# Patient Record
Sex: Female | Born: 1935 | Race: White | Hispanic: No | Smoking: Never smoker
Health system: Southern US, Community
[De-identification: ages and names within clinical notes are randomized; demographics above are authoritative.]

## PROBLEM LIST (undated history)

## (undated) DIAGNOSIS — F329 Major depressive disorder, single episode, unspecified: Secondary | ICD-10-CM

## (undated) DIAGNOSIS — I10 Essential (primary) hypertension: Secondary | ICD-10-CM

## (undated) DIAGNOSIS — I779 Disorder of arteries and arterioles, unspecified: Secondary | ICD-10-CM

## (undated) DIAGNOSIS — I4719 Other supraventricular tachycardia: Secondary | ICD-10-CM

## (undated) DIAGNOSIS — E7800 Pure hypercholesterolemia, unspecified: Secondary | ICD-10-CM

## (undated) DIAGNOSIS — E039 Hypothyroidism, unspecified: Secondary | ICD-10-CM

## (undated) DIAGNOSIS — I639 Cerebral infarction, unspecified: Secondary | ICD-10-CM

## (undated) DIAGNOSIS — I739 Peripheral vascular disease, unspecified: Secondary | ICD-10-CM

## (undated) DIAGNOSIS — I471 Supraventricular tachycardia: Secondary | ICD-10-CM

## (undated) DIAGNOSIS — F32A Depression, unspecified: Secondary | ICD-10-CM

## (undated) HISTORY — DX: Cerebral infarction, unspecified: I63.9

## (undated) HISTORY — PX: EYE SURGERY: SHX253

## (undated) HISTORY — PX: BREAST SURGERY: SHX581

---

## 1999-02-28 ENCOUNTER — Other Ambulatory Visit: Admission: RE | Admit: 1999-02-28 | Discharge: 1999-02-28 | Payer: Self-pay | Admitting: Internal Medicine

## 2000-01-25 ENCOUNTER — Encounter: Admission: RE | Admit: 2000-01-25 | Discharge: 2000-01-25 | Payer: Self-pay | Admitting: Internal Medicine

## 2000-01-25 ENCOUNTER — Encounter: Payer: Self-pay | Admitting: Internal Medicine

## 2001-05-12 ENCOUNTER — Emergency Department (HOSPITAL_COMMUNITY): Admission: EM | Admit: 2001-05-12 | Discharge: 2001-05-13 | Payer: Self-pay | Admitting: Emergency Medicine

## 2001-05-14 ENCOUNTER — Encounter: Admission: RE | Admit: 2001-05-14 | Discharge: 2001-05-14 | Payer: Self-pay | Admitting: Internal Medicine

## 2001-05-14 ENCOUNTER — Encounter: Payer: Self-pay | Admitting: Internal Medicine

## 2001-05-20 ENCOUNTER — Encounter: Admission: RE | Admit: 2001-05-20 | Discharge: 2001-05-20 | Payer: Self-pay | Admitting: Internal Medicine

## 2001-05-20 ENCOUNTER — Encounter: Payer: Self-pay | Admitting: Internal Medicine

## 2001-05-22 ENCOUNTER — Encounter: Admission: RE | Admit: 2001-05-22 | Discharge: 2001-05-22 | Payer: Self-pay | Admitting: Internal Medicine

## 2001-05-22 ENCOUNTER — Encounter: Payer: Self-pay | Admitting: Internal Medicine

## 2001-06-15 ENCOUNTER — Encounter: Payer: Self-pay | Admitting: Emergency Medicine

## 2001-06-15 ENCOUNTER — Encounter: Payer: Self-pay | Admitting: Orthopedic Surgery

## 2001-06-15 ENCOUNTER — Inpatient Hospital Stay (HOSPITAL_COMMUNITY): Admission: EM | Admit: 2001-06-15 | Discharge: 2001-06-16 | Payer: Self-pay | Admitting: Emergency Medicine

## 2001-07-29 ENCOUNTER — Encounter: Admission: RE | Admit: 2001-07-29 | Discharge: 2001-08-01 | Payer: Self-pay | Admitting: Family Medicine

## 2001-08-12 ENCOUNTER — Encounter: Admission: RE | Admit: 2001-08-12 | Discharge: 2001-09-30 | Payer: Self-pay | Admitting: Family Medicine

## 2002-01-20 ENCOUNTER — Encounter: Admission: RE | Admit: 2002-01-20 | Discharge: 2002-02-05 | Payer: Self-pay | Admitting: Orthopedic Surgery

## 2002-08-25 ENCOUNTER — Ambulatory Visit (HOSPITAL_BASED_OUTPATIENT_CLINIC_OR_DEPARTMENT_OTHER): Admission: RE | Admit: 2002-08-25 | Discharge: 2002-08-25 | Payer: Self-pay | Admitting: *Deleted

## 2002-08-25 ENCOUNTER — Encounter: Payer: Self-pay | Admitting: Otolaryngology

## 2002-08-25 ENCOUNTER — Encounter (INDEPENDENT_AMBULATORY_CARE_PROVIDER_SITE_OTHER): Payer: Self-pay | Admitting: *Deleted

## 2004-09-26 ENCOUNTER — Ambulatory Visit: Payer: Self-pay | Admitting: Family Medicine

## 2004-09-29 ENCOUNTER — Ambulatory Visit: Payer: Self-pay | Admitting: Family Medicine

## 2004-10-02 ENCOUNTER — Ambulatory Visit: Payer: Self-pay | Admitting: Family Medicine

## 2004-10-05 ENCOUNTER — Ambulatory Visit: Payer: Self-pay | Admitting: Gastroenterology

## 2004-11-23 ENCOUNTER — Ambulatory Visit: Payer: Self-pay | Admitting: Gastroenterology

## 2004-11-27 ENCOUNTER — Ambulatory Visit: Payer: Self-pay | Admitting: Gastroenterology

## 2005-10-19 ENCOUNTER — Ambulatory Visit: Payer: Self-pay | Admitting: Family Medicine

## 2006-12-30 ENCOUNTER — Ambulatory Visit: Payer: Self-pay | Admitting: Family Medicine

## 2006-12-30 LAB — CONVERTED CEMR LAB
ALT: 15 units/L (ref 0–40)
AST: 23 units/L (ref 0–37)
Basophils Relative: 0.2 % (ref 0.0–1.0)
Bilirubin, Direct: 0.1 mg/dL (ref 0.0–0.3)
CO2: 35 meq/L — ABNORMAL HIGH (ref 19–32)
Calcium: 9.3 mg/dL (ref 8.4–10.5)
Direct LDL: 135.3 mg/dL
Eosinophils Absolute: 0.2 10*3/uL (ref 0.0–0.6)
Eosinophils Relative: 2.9 % (ref 0.0–5.0)
GFR calc Af Amer: 103 mL/min
Glucose, Bld: 110 mg/dL — ABNORMAL HIGH (ref 70–99)
HCT: 44.8 % (ref 36.0–46.0)
HDL: 42.4 mg/dL (ref >39.0)
Hemoglobin: 14.9 g/dL (ref 12.0–15.0)
Lymphocytes Relative: 34.1 % (ref 12.0–46.0)
MCV: 90.4 fL (ref 78.0–100.0)
Neutro Abs: 4.2 10*3/uL (ref 1.4–7.7)
Neutrophils Relative %: 54.2 % (ref 43.0–77.0)
Platelets: 178 10*3/uL (ref 150–400)
Sodium: 143 meq/L (ref 135–145)
Total Protein: 6.9 g/dL (ref 6.0–8.3)
VLDL: 44 mg/dL — ABNORMAL HIGH (ref 0–40)
WBC: 7.7 10*3/uL (ref 4.5–10.5)

## 2007-01-02 ENCOUNTER — Ambulatory Visit: Payer: Self-pay | Admitting: Family Medicine

## 2007-01-08 ENCOUNTER — Telehealth: Payer: Self-pay | Admitting: Family Medicine

## 2007-02-14 ENCOUNTER — Ambulatory Visit: Payer: Self-pay | Admitting: Family Medicine

## 2007-02-14 DIAGNOSIS — R319 Hematuria, unspecified: Secondary | ICD-10-CM | POA: Insufficient documentation

## 2007-12-10 ENCOUNTER — Encounter: Payer: Self-pay | Admitting: Family Medicine

## 2007-12-11 ENCOUNTER — Ambulatory Visit: Payer: Self-pay | Admitting: Family Medicine

## 2007-12-11 DIAGNOSIS — I10 Essential (primary) hypertension: Secondary | ICD-10-CM

## 2007-12-11 DIAGNOSIS — F339 Major depressive disorder, recurrent, unspecified: Secondary | ICD-10-CM

## 2007-12-11 DIAGNOSIS — E039 Hypothyroidism, unspecified: Secondary | ICD-10-CM | POA: Insufficient documentation

## 2007-12-11 LAB — CONVERTED CEMR LAB
Albumin: 3.8 g/dL (ref 3.5–5.2)
Alkaline Phosphatase: 57 units/L (ref 39–117)
BUN: 12 mg/dL (ref 6–23)
Basophils Relative: 0.6 % (ref 0.0–1.0)
Calcium: 9.3 mg/dL (ref 8.4–10.5)
Creatinine, Ser: 0.9 mg/dL (ref 0.4–1.2)
Eosinophils Relative: 3 % (ref 0.0–5.0)
GFR calc Af Amer: 77 mL/min
Glucose, Bld: 123 mg/dL — ABNORMAL HIGH (ref 70–99)
Hemoglobin: 14.8 g/dL (ref 12.0–15.0)
LDL Cholesterol: 107 mg/dL — ABNORMAL HIGH (ref 0–99)
Lymphocytes Relative: 34.9 % (ref 12.0–46.0)
Monocytes Relative: 8.2 % (ref 3.0–12.0)
Neutrophils Relative %: 53.3 % (ref 43.0–77.0)
Potassium: 3.5 meq/L (ref 3.5–5.1)
Protein, U semiquant: NEGATIVE
RBC: 4.7 M/uL (ref 3.87–5.11)
TSH: 2.52 microintl units/mL (ref 0.35–5.50)
Total CHOL/HDL Ratio: 4.5
Total Protein: 6.6 g/dL (ref 6.0–8.3)
Urobilinogen, UA: 0.2
VLDL: 30 mg/dL (ref 0–40)
WBC Urine, dipstick: NEGATIVE
WBC: 7.9 10*3/uL (ref 4.5–10.5)

## 2008-06-30 ENCOUNTER — Encounter: Payer: Self-pay | Admitting: Family Medicine

## 2008-08-09 ENCOUNTER — Ambulatory Visit: Payer: Self-pay | Admitting: Family Medicine

## 2008-08-09 ENCOUNTER — Inpatient Hospital Stay (HOSPITAL_COMMUNITY): Admission: AD | Admit: 2008-08-09 | Discharge: 2008-08-10 | Payer: Self-pay | Admitting: Family Medicine

## 2008-08-11 ENCOUNTER — Ambulatory Visit: Payer: Self-pay | Admitting: Cardiology

## 2008-08-19 ENCOUNTER — Ambulatory Visit: Payer: Self-pay | Admitting: Family Medicine

## 2008-08-26 ENCOUNTER — Ambulatory Visit: Payer: Self-pay | Admitting: Family Medicine

## 2008-08-26 DIAGNOSIS — R05 Cough: Secondary | ICD-10-CM

## 2008-08-26 DIAGNOSIS — R059 Cough, unspecified: Secondary | ICD-10-CM | POA: Insufficient documentation

## 2008-09-02 ENCOUNTER — Ambulatory Visit: Payer: Self-pay | Admitting: Family Medicine

## 2008-09-30 ENCOUNTER — Ambulatory Visit: Payer: Self-pay | Admitting: Family Medicine

## 2009-08-15 ENCOUNTER — Ambulatory Visit: Payer: Self-pay | Admitting: Family Medicine

## 2009-08-31 ENCOUNTER — Ambulatory Visit: Payer: Self-pay | Admitting: Family Medicine

## 2009-09-23 ENCOUNTER — Ambulatory Visit: Payer: Self-pay | Admitting: Family Medicine

## 2009-09-27 LAB — CONVERTED CEMR LAB
ALT: 20 U/L
AST: 26 U/L
Albumin: 4.1 g/dL
Alkaline Phosphatase: 46 U/L
BUN: 11 mg/dL
Basophils Absolute: 0 K/uL
Basophils Relative: 0.2 %
Bilirubin, Direct: 0.1 mg/dL
CO2: 33 meq/L — ABNORMAL HIGH
Calcium: 10 mg/dL
Chloride: 102 meq/L
Creatinine, Ser: 0.8 mg/dL
Eosinophils Absolute: 0.2 K/uL
Eosinophils Relative: 2.6 %
GFR calc non Af Amer: 72.3 mL/min
Glucose, Bld: 105 mg/dL — ABNORMAL HIGH
HCT: 43.8 %
Hemoglobin: 14.3 g/dL
Lymphocytes Relative: 28.8 %
Lymphs Abs: 1.8 K/uL
MCHC: 32.6 g/dL
MCV: 96.1 fL
Monocytes Absolute: 0.5 K/uL
Monocytes Relative: 7.8 %
Neutro Abs: 3.7 K/uL
Neutrophils Relative %: 60.6 %
Platelets: 150 K/uL
Potassium: 3.9 meq/L
RBC: 4.55 M/uL
RDW: 13.2 %
Sodium: 145 meq/L
TSH: 1.54 u[IU]/mL
Total Bilirubin: 1 mg/dL
Total Protein: 7.3 g/dL
WBC: 6.2 10*3/microliter

## 2009-10-14 ENCOUNTER — Ambulatory Visit: Payer: Self-pay | Admitting: Family Medicine

## 2009-12-14 ENCOUNTER — Telehealth: Payer: Self-pay | Admitting: Family Medicine

## 2009-12-15 ENCOUNTER — Ambulatory Visit: Payer: Self-pay | Admitting: Family Medicine

## 2009-12-15 DIAGNOSIS — R609 Edema, unspecified: Secondary | ICD-10-CM | POA: Insufficient documentation

## 2010-02-01 ENCOUNTER — Telehealth: Payer: Self-pay | Admitting: Family Medicine

## 2010-04-11 ENCOUNTER — Ambulatory Visit: Payer: Self-pay | Admitting: Family Medicine

## 2010-04-11 LAB — CONVERTED CEMR LAB
Albumin: 4.3 g/dL (ref 3.5–5.2)
Alkaline Phosphatase: 53 units/L (ref 39–117)
Basophils Absolute: 0 10*3/uL (ref 0.0–0.1)
Bilirubin, Direct: 0.1 mg/dL (ref 0.0–0.3)
CO2: 35 meq/L — ABNORMAL HIGH (ref 19–32)
Calcium: 10.4 mg/dL (ref 8.4–10.5)
Creatinine, Ser: 0.8 mg/dL (ref 0.4–1.2)
Eosinophils Absolute: 0.1 10*3/uL (ref 0.0–0.7)
Free T4: 1.1 ng/dL (ref 0.60–1.60)
Glucose, Bld: 117 mg/dL — ABNORMAL HIGH (ref 70–99)
Lymphocytes Relative: 24.4 % (ref 12.0–46.0)
MCHC: 34.5 g/dL (ref 30.0–36.0)
Neutro Abs: 5.2 10*3/uL (ref 1.4–7.7)
Neutrophils Relative %: 66.1 % (ref 43.0–77.0)
RDW: 13.5 % (ref 11.5–14.6)
T3, Free: 2.4 pg/mL (ref 2.3–4.2)
Transferrin: 282 mg/dL (ref 212.0–360.0)
Vitamin B-12: 1273 pg/mL — ABNORMAL HIGH (ref 211–911)

## 2010-04-13 ENCOUNTER — Encounter: Payer: Self-pay | Admitting: Family Medicine

## 2010-04-13 ENCOUNTER — Ambulatory Visit: Payer: Self-pay | Admitting: Family Medicine

## 2010-04-13 LAB — CONVERTED CEMR LAB
Ketones, urine, test strip: NEGATIVE
Nitrite: NEGATIVE
Protein, U semiquant: NEGATIVE
Urobilinogen, UA: 0.2

## 2010-04-27 ENCOUNTER — Encounter: Payer: Self-pay | Admitting: Family Medicine

## 2010-08-08 NOTE — Assessment & Plan Note (Signed)
Summary: follow up - rv   Vital Signs:  Patient profile:   77 year old female Height:      61 inches Weight:      152 pounds BMI:     28.82 Temp:     98.0 degrees F oral BP sitting:   140 / 80  (left arm) Cuff size:   regular  Vitals Entered By: Kern Reap CMA Duncan Dull) (April 11, 2010 11:57 AM) CC: follow-up visit   CC:  follow-up visit.  History of Present Illness: momma is an 77 year old, widowed female, nonsmoker, who comes in today for evaluation of episodic symptoms of feeling flushed.  A cane and jittery for the past 6 months.  Review of systems negative.  No new medications.  In reviewing her new medication.  She takes her Lexapro in the morning.  It may be a side effect from that.  Will switch and taken at bedtime.  She's also having difficulty swallowing.  The potassium and we will switch her to another product.  BP good at 140/80.  Blood pressure at home in the morning, usually 120 to 130 systolic over 80 diastolic  Allergies: 1)  ! Sulfa  Review of Systems      See HPI  Physical Exam  General:  Well-developed,well-nourished,in no acute distress; alert,appropriate and cooperative throughout examination Extremities:  1+ left pedal edema and 1+ right pedal edema.     Impression & Recommendations:  Problem # 1:  EDEMA- LOCALIZED (ICD-782.3) Assessment Unchanged  Her updated medication list for this problem includes:    Indapamide 2.5 Mg Tabs (Indapamide) .Marland Kitchen... 1 once daily    Hydrochlorothiazide 25 Mg Tabs (Hydrochlorothiazide) .Marland Kitchen... Take 1 tablet by mouth two times a day  Orders: Prescription Created Electronically [phone])  Problem # 2:  HYPOTHYROIDISM (ICD-244.9) Assessment: Unchanged  Her updated medication list for this problem includes:    Synthroid 125 Mcg Tabs (Levothyroxine sodium) .Marland Kitchen... 1 once daily  Orders: Venipuncture (64403) TLB-BMP (Basic Metabolic Panel-BMET) (80048-METABOL) TLB-CBC Platelet - w/Differential  (85025-CBCD) TLB-Hepatic/Liver Function Pnl (80076-HEPATIC) TLB-TSH (Thyroid Stimulating Hormone) (84443-TSH) TLB-T4 (Thyrox), Free [phone]) TLB-T3, Free (Triiodothyronine) (84481-T3FREE) TLB-B12 + Folate Pnl ([id number]/CZY) TLB-IBC Pnl (Iron/FE;Transferrin) (83550-IBC) Prescription Created Electronically [id number]) Specimen Handling (16010)  Complete Medication List: 1)  Atenolol 50 Mg Tabs (Atenolol) .Marland Kitchen.. 1 tablet by mouth twice a day 2)  Cyanocobalamin 1000 Mcg/ml Soln (Cyanocobalamin) .... Administer 1 ml intramuscularly as directed 3)  Indapamide 2.5 Mg Tabs (Indapamide) .Marland Kitchen.. 1 once daily 4)  Lexapro 10 Mg Tabs (Escitalopram oxalate) .... 1/2 at bedtime 5)  Lipitor 20 Mg Tabs (Atorvastatin calcium) .Marland Kitchen.. 1 at bedtime 6)  Synthroid 125 Mcg Tabs (Levothyroxine sodium) .Marland Kitchen.. 1 once daily 7)  Bd Disp Needles 27g X 1/2" Misc (Needle (disp)) .... Use for b12 injections 8)  Norvasc 5 Mg Tabs (Amlodipine besylate) .... Take 1 tablet by mouth two times a day 9)  Hydrochlorothiazide 25 Mg Tabs (Hydrochlorothiazide) .... Take 1 tablet by mouth two times a day 10)  Effer-k 20 Meq Tbef (Potassium bicarb-citric acid) .... Take 1 tablet by mouth every morning  Other Orders: Influenza Vaccine MCR (93235)  Patient Instructions: 1)  switch the Lexapro to a half a tablet a day at bedtime. 2)  Stop the potassium ........Marland Kitchen   effer-k  one daily. Prescriptions: EFFER-K 20 MEQ TBEF (POTASSIUM BICARB-CITRIC ACID) Take 1 tablet by mouth every morning  #100 x 3   Entered and Authorized by:   Roderick Pee MD   Signed  by:   Roderick Pee MD on 04/11/2010   Method used:   [facility]        [facility] [address] [phone]* (retail)       [address]       [place], [place]  [address]       Ph: [phone] or [phone]       Fax: [phone]   RxID:   [id number]    Immunizations Administered:  Influenza Vaccine # 1:    Vaccine Type: Fluvax MCR    Site:  left deltoid    Mfr: GlaxoSmithKline    Dose: 0.5 ml    Route: IM    Given by: Kern Reap CMA (AAMA)    Exp. Date: 01/06/2011    Lot #: [id number]    Physician counseled: yes

## 2010-08-08 NOTE — Progress Notes (Signed)
Summary: Pt said HCTZ med was changed from qd to bid. Please re-write  Phone Note Call from Patient Call back at Home Phone [phone]   Caller: Patient Summary of Call: Pt called and said that Dr Tawanna Cooler change her HCTZ from 1 once daily to two times a day.   Pt has refill 3 refills remaining, but [facility] [address] will not refill until the instructions have been changed to two times a day.  Initial call taken by: Lucy Antigua,  February 01, 2010 8:10 AM    New/Updated Medications: HYDROCHLOROTHIAZIDE 25 MG TABS (HYDROCHLOROTHIAZIDE) Take 1 tablet by mouth two times a day Prescriptions: HYDROCHLOROTHIAZIDE 25 MG TABS (HYDROCHLOROTHIAZIDE) Take 1 tablet by mouth two times a day  #60 x 3   Entered by:   Kern Reap CMA (AAMA)   Authorized by:   Roderick Pee MD   Signed by:   Kern Reap CMA (AAMA) on 02/01/2010   Method used:   [facility]        [facility] [address] [phone]* (retail)       [address]       [place], [place]  [address]       Ph: [phone] or [phone]       Fax: [phone]   RxID:   [id number]

## 2010-08-08 NOTE — Assessment & Plan Note (Signed)
Summary: 1 month rov/njr   Vital Signs:  Patient profile:   77 year old female Weight:      154 pounds Temp:     97.9 degrees F oral BP sitting:   170 / 78  (left arm) Cuff size:   regular  Vitals Entered By: Kern Reap CMA Duncan Dull) (September 23, 2009 10:21 AM)  Reason for Visit follow up blood pressure  History of Present Illness: Maria Clarke is an 77 year old widowed female, who comes in today for follow-up of hypertension.  We been adjusting her medication because her blood pressure has been elevated.  She, states she's consistently taking her blood pressure as outlined.  BP today by me right arm sitting position 180/80.  Allergies: 1)  ! Sulfa  Past History:  Past medical, surgical, family and social histories (including risk factors) reviewed for relevance to current acute and chronic problems.  Past Medical History: Reviewed history from 12/11/2007 and no changes required. Anemia- Depression Hyperlipidemia Hypertension Hypothyroidism  Family History: Reviewed history from 12/11/2007 and no changes required. she has one brother, who is in good health 3 sisters, one died of a brain tumor one Eloise died of congestive heart failure.  The other died last year of old age.  Social History: Reviewed history from 12/11/2007 and no changes required. 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 1 tablet by mouth twice a day    Indapamide 2.5 Mg Tabs (Indapamide) .Marland Kitchen... 1 once daily    Norvasc 5  Mg Tabs (Amlodipine besylate) .Marland Kitchen... Take 1 tablet by mouth two times a day  Orders: Venipuncture ([address]) TLB-BMP (Basic Metabolic Panel-BMET) (80048-METABOL) TLB-CBC Platelet - w/Differential (85025-CBCD) TLB-Hepatic/Liver Function Pnl (80076-HEPATIC) TLB-TSH (Thyroid Stimulating Hormone) (60454-UJW) Prescription Created Electronically [phone])  Complete Medication List: 1)  Atenolol 50 Mg Tabs (Atenolol) .Marland Kitchen.. 1 tablet by mouth twice a day 2)  Cyanocobalamin 1000 Mcg/ml Soln (Cyanocobalamin) .... Administer 1 ml intramuscularly as directed 3)  Indapamide 2.5 Mg Tabs (Indapamide) .Marland Kitchen.. 1 once daily 4)  Klor-con M20 20 Meq Tbcr (Potassium chloride crys cr) .Marland Kitchen.. 1 qam 5)  Lexapro 10 Mg Tabs (Escitalopram oxalate) .... 1/2 tablet by mouth every morning 6)  Lipitor 20 Mg Tabs (Atorvastatin calcium) .Marland Kitchen.. 1 at bedtime 7)  Synthroid 125 Mcg Tabs (Levothyroxine sodium) .Marland Kitchen.. 1 once daily 8)  Bd Disp Needles 27g X 1/2" Misc (Needle (disp)) .... Use for b12 injections 9)  Norvasc 5 Mg Tabs (Amlodipine besylate) .... Take 1 tablet by mouth two times a day  Patient Instructions: 1)  increase the Norvasc to take one tablet twice a day.  Continue the atenolol twice a day, and the indapamide, once daily, and potassium supplement. 2)  Check your blood pressure morning, noon, and bedtime, and return in 3 weeks for follow-up when u  returns bring  a record of all your blood pressure readings and the device Prescriptions: NORVASC 5 MG TABS (AMLODIPINE BESYLATE) Take 1 tablet by mouth two times a day  #200 x 3   Entered and Authorized by:   Roderick Pee MD   Signed  by:   Roderick Pee MD on 09/23/2009   Method used:   [facility] to        [facility] [address] [phone]* (retail)       [address]       [place], [place]  [address]       Ph: [phone] or [phone]       Fax: [phone]   RxID:   [id number]

## 2010-08-08 NOTE — Assessment & Plan Note (Signed)
Summary: recheck/dm   Vital Signs:  Patient profile:   77 year old female Weight:      154 pounds Temp:     98.5 degrees F oral BP sitting:   144 / 72  (left arm) Cuff size:   regular  Vitals Entered By: Kern Reap CMA Duncan Dull) (August 31, 2009 9:32 AM)  Reason for Visit follow up HTN  History of Present Illness: Mallissa is an 77 year old female, who comes in today for reevaluation of hypertension.  Couple weeks ago.  Her blood pressure spikes because she skipped a dose.  Now BP is averaging 140 to 150 systolic.  Diastolic 60 to 80.  She also has some symptoms of allergic rhinitis with head congestion, postnasal drip, and some cord, and throat, sinus pressure, pain.  She thinks is related to her blood pressure.  However, in the afternoon, her blood pressure is fairly normal.  Allergies: 1)  ! Sulfa PMH-FH-SH reviewed-no changes except otherwise noted  Review of Systems      See HPI  Physical Exam  General:  Well-developed,well-nourished,in no acute distress; alert,appropriate and cooperative throughout examination Heart:  150/60 with her cuff and  ours.   Impression & Recommendations:  Problem # 1:  HYPERTENSION (ICD-401.9) Assessment Improved  Her updated medication list for this problem includes:    Atenolol 50 Mg Tabs (Atenolol) .Marland Kitchen... 1 tablet by mouth twice a day    Indapamide 2.5 Mg Tabs (Indapamide) .Marland Kitchen... 1 once daily    Norvasc 5 Mg Tabs (Amlodipine besylate) .Marland Kitchen... Take 1 tablet by mouth every morning  Complete Medication List: 1)  Atenolol 50 Mg Tabs (Atenolol) .Marland Kitchen.. 1 tablet by mouth twice a day 2)  Cyanocobalamin 1000 Mcg/ml Soln (Cyanocobalamin) .... Administer 1 ml intramuscularly as directed 3)  Indapamide 2.5 Mg Tabs (Indapamide) .Marland Kitchen.. 1 once daily 4)  Klor-con M20 20 Meq Tbcr (Potassium chloride crys cr) .Marland Kitchen.. 1 qam 5)  Lexapro 10 Mg Tabs (Escitalopram oxalate) .... 1/2 tablet by mouth every morning 6)  Lipitor 20 Mg Tabs (Atorvastatin calcium) .Marland Kitchen.. 1  at bedtime 7)  Synthroid 125 Mcg Tabs (Levothyroxine sodium) .Marland Kitchen.. 1 once daily 8)  Bd Disp Needles 27g X 1/2" Misc (Needle (disp)) .... Use for b12 injections 9)  Norvasc 5 Mg Tabs (Amlodipine besylate) .... Take 1 tablet by mouth every morning  Patient Instructions: 1)    Check your blood pressure once a day in the morning.  Continue current medications.  Follow-up in 4 weeks. 2)  Take plain Claritin, 10 mg in the morning for your allergic rhinitis

## 2010-08-08 NOTE — Assessment & Plan Note (Signed)
Summary: feet swelling/njr   Vital Signs:  Patient profile:   77 year old female Weight:      155 pounds Temp:     98.2 degrees F oral BP sitting:   142 / 70  (left arm) Cuff size:   regular  Vitals Entered By: Kern Reap CMA Duncan Dull) (December 15, 2009 11:43 AM) CC: feet swelling    CC:  feet swelling .  History of Present Illness: Maria Clarke is a 77 year old, widowed female, nonsmoker, who comes in today for evaluation of peripheral edema.  She's been on Norvasc for many, months.  She's had good blood pressure control, systolic 120 to 130over 70 to 80 diastolic at home.  Over the past, week she's noticed peripheral edema.  She's had no chest pain, shortness of breath, etc..  She does consume a lot of salt.  Allergies: 1)  ! Sulfa  Past History:  Past medical, surgical, family and social histories (including risk factors) reviewed for relevance to current acute and chronic problems.  Past Medical History: Reviewed history from 12/11/2007 and no changes required. Anemia- Depression Hyperlipidemia Hypertension Hypothyroidism  Family History: Reviewed history from 12/11/2007 and no changes required. she has one brother, who is in good health 3 sisters, one died of a brain tumor one Eloise died of congestive heart failure.  The other died last year of old age.  Social History: Reviewed history from 12/11/2007 and no changes required. 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     Problems:  Medical Problems Added: 1)  Dx of Edema- Localized  (ICD-782.3)  Impression & Recommendations:  Problem # 1:  EDEMA- LOCALIZED (ICD-782.3) Assessment Deteriorated  Her  updated medication list for this problem includes:    Indapamide 2.5 Mg Tabs (Indapamide) .Marland Kitchen... 1 once daily    Hydrochlorothiazide 25 Mg Tabs (Hydrochlorothiazide) .Marland Kitchen... Take 1 tablet by mouth every morning  Complete Medication List: 1)  Atenolol 50 Mg Tabs (Atenolol) .Marland Kitchen.. 1 tablet by mouth twice a day 2)  Cyanocobalamin 1000 Mcg/ml Soln (Cyanocobalamin) .... Administer 1 ml intramuscularly as directed 3)  Indapamide 2.5 Mg Tabs (Indapamide) .Marland Kitchen.. 1 once daily 4)  Klor-con M20 20 Meq Tbcr (Potassium chloride crys cr) .Marland Kitchen.. 1 qam 5)  Lexapro 10 Mg Tabs (Escitalopram oxalate) .... 1/2 tablet by mouth every morning 6)  Lipitor 20 Mg Tabs (Atorvastatin calcium) .Marland Kitchen.. 1 at bedtime 7)  Synthroid 125 Mcg Tabs (Levothyroxine sodium) .Marland Kitchen.. 1 once daily 8)  Bd Disp Needles 27g X 1/2" Misc (Needle (disp)) .... Use for b12 injections 9)  Norvasc 5 Mg Tabs (Amlodipine besylate) .... Take 1 tablet by mouth two times a day 10)  Hydrochlorothiazide 25 Mg Tabs (Hydrochlorothiazide) .... Take 1 tablet by mouth every morning  Patient Instructions: 1)  take 25 mg of HCTZ q.a.m. p.r.n. for fluid retention. 2)  Stay on a complete salt free diet Prescriptions: HYDROCHLOROTHIAZIDE 25 MG TABS (HYDROCHLOROTHIAZIDE) Take 1 tablet by mouth every morning  #100 x 3   Entered and Authorized by:   Roderick Pee MD   Signed by:   Roderick Pee MD on 12/15/2009   Method used:   [facility] to        [facility] [address] [phone]* (  retail)       [address]       [place], [place]  [address]       Ph: [phone] or [phone]       Fax: [phone]   RxID:   [id number]

## 2010-08-08 NOTE — Assessment & Plan Note (Signed)
Summary: 3 WEEK FUP//CCM   Vital Signs:  Patient profile:   77 year old female Weight:      155 pounds Temp:     98.1 degrees F oral BP sitting:   130 / 68  (left arm) Cuff size:   regular  Vitals Entered By: Kern Reap CMA Duncan Dull) (October 14, 2009 11:38 AM) CC: follow-up visit   CC:  follow-up visit.  History of Present Illness: e. is a 77 y/o fem widow nonsmoker comes in today for reevaluation of hypertension.  Her blood pressure was elevated.  Therefore, we increased the Norvasc to 5 mg b.i.d.  BP today and her blood pressures at home have now normalized.  Today, 130/68, pulse 70, regular.  No side effects from medication  Allergies: 1)  ! Sulfa  Past History:  Past medical, surgical, family and social histories (including risk factors) reviewed for relevance to current acute and chronic problems.  Past Medical History: Reviewed history from 12/11/2007 and no changes required. Anemia- Depression Hyperlipidemia Hypertension Hypothyroidism  Family History: Reviewed history from 12/11/2007 and no changes required. she has one brother, who is in good health 3 sisters, one died of a brain tumor one Eloise died of congestive heart failure.  The other died last year of old age.  Social History: Reviewed history from 12/11/2007 and no changes required. 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 1 tablet by mouth twice a day    Indapamide 2.5 Mg Tabs (Indapamide) .Marland Kitchen... 1 once daily    Norvasc 5 Mg Tabs (Amlodipine besylate) .Marland Kitchen... Take 1 tablet by mouth two times a  day  Complete Medication List: 1)  Atenolol 50 Mg Tabs (Atenolol) .Marland Kitchen.. 1 tablet by mouth twice a day 2)  Cyanocobalamin 1000 Mcg/ml Soln (Cyanocobalamin) .... Administer 1 ml intramuscularly as directed 3)  Indapamide 2.5 Mg Tabs (Indapamide) .Marland Kitchen.. 1 once daily 4)  Klor-con M20 20 Meq Tbcr (Potassium chloride crys cr) .Marland Kitchen.. 1 qam 5)  Lexapro 10 Mg Tabs (Escitalopram oxalate) .... 1/2 tablet by mouth every morning 6)  Lipitor 20 Mg Tabs (Atorvastatin calcium) .Marland Kitchen.. 1 at bedtime 7)  Synthroid 125 Mcg Tabs (Levothyroxine sodium) .Marland Kitchen.. 1 once daily 8)  Bd Disp Needles 27g X 1/2" Misc (Needle (disp)) .... Use for b12 injections 9)  Norvasc 5 Mg Tabs (Amlodipine besylate) .... Take 1 tablet by mouth two times a day  Patient Instructions: 1)  continue your current medications. 2)  Check your blood pressure once weekly in the morning. 3)  If you get an elevated reading then checked y  blood pressure daily for two weeks.  At the end of two weeks.  If your blood pressures have gone back to normal then, just check your blood pressure weekly.  If, however, by checking your blood pressure daily for two weeks they are  all elevated, then call me. 4)  Return October 2011 for y   annual exam Prescriptions: NORVASC 5 MG TABS (AMLODIPINE BESYLATE) Take 1 tablet by mouth two times a day  #100 Tablet x 2   Entered and Authorized by:   Roderick Pee MD   Signed by:   Roderick Pee  MD on 10/14/2009   Method used:   Print then Give to Patient   RxID:   [id number]

## 2010-08-08 NOTE — Progress Notes (Signed)
Summary: Pt called to give update on swelling in feet and bp update  Phone Note Call from Patient Call back at [phone] cell   Caller: Patient Summary of Call: Pt called and said that her feet aren't swelling as bad and her bp is normal.    Initial call taken by: Lucy Antigua,  December 14, 2009 10:10 AM  Follow-up for Phone Call        Provider Notified Follow-up by: Roderick Pee MD,  December 14, 2009 10:25 AM

## 2010-08-08 NOTE — Miscellaneous (Signed)
   Clinical Lists Changes  Observations: Added new observation of PNEUMOVAX: Historical (07/09/2005 8:03) Added new observation of TD BOOSTER: Historical (07/10/2003 8:03) Added new observation of PNEUMOVAX: Historical (07/09/2000 8:03)      Immunization History:  Tetanus/Td Immunization History:    Tetanus/Td:  historical (07/10/2003)  Pneumovax Immunization History:    Pneumovax:  historical (07/09/2000)    Pneumovax:  historical (07/09/2005)

## 2010-08-08 NOTE — Assessment & Plan Note (Signed)
Summary: PER DR TODD//LH   Vital Signs:  Patient profile:   77 year old female Weight:      158 pounds Temp:     98.4 degrees F oral BP sitting:   154 / 80  (left arm) Cuff size:   regular  Vitals Entered By: Kern Reap CMA Duncan Dull) (August 15, 2009 8:41 AM)  Reason for Visit blood pressure  History of Present Illness: Jahleah is a 77 year old, widowed female, nonsmoker, who comes in today for evaluation of hypertension.  Yesterday he she forgot to take her medication.  In evening.  She felt weak.  It was global not focal.  No neurologic symptoms.  BP was 190/110.  She called her son Gery Pray who called me.  We had her take all her medications, stay at bed rest, and she is here this morning for follow-up.  BP today 160/80 right arm sitting position.  I neurologically asymptomatic  Allergies: 1)  ! Sulfa  Past History:  Past medical, surgical, family and social histories (including risk factors) reviewed, and no changes noted (except as noted below).  Past Medical History: Reviewed history from 12/11/2007 and no changes required. Anemia- Depression Hyperlipidemia Hypertension Hypothyroidism  Family History: Reviewed history from 12/11/2007 and no changes required. she has one brother, who is in good health 3 sisters, one died of a brain tumor one Eloise died of congestive heart failure.  The other died last year of old age.  Social History: Reviewed history from 12/11/2007 and no changes required. 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 1 tablet by mouth twice a day    Indapamide 2.5 Mg Tabs (Indapamide) .Marland Kitchen... 1 once daily    Norvasc 5 Mg Tabs (Amlodipine besylate) .Marland Kitchen... Take 1 tablet by mouth every morning  Orders: Prescription Created Electronically [phone])  Complete Medication List: 1)  Atenolol 50 Mg Tabs (Atenolol) .Marland Kitchen.. 1 tablet by mouth twice a day 2)  Cyanocobalamin 1000 Mcg/ml Soln (Cyanocobalamin) .... Administer 1 ml intramuscularly as directed 3)  Indapamide 2.5 Mg Tabs (Indapamide) .Marland Kitchen.. 1 once daily 4)  Klor-con M20 20 Meq Tbcr (Potassium chloride crys cr) .Marland Kitchen.. 1 qam 5)  Lexapro 10 Mg Tabs (Escitalopram oxalate) .... 1/2 tablet by mouth every morning 6)  Lipitor 20 Mg Tabs (Atorvastatin calcium) .Marland Kitchen.. 1 at bedtime 7)  Synthroid 125 Mcg Tabs (Levothyroxine sodium) .Marland Kitchen.. 1 once daily 8)  Bd Disp Needles 27g X 1/2" Misc (Needle (disp)) .... Use for b12 injections 9)  Norvasc 5 Mg Tabs (Amlodipine besylate) .... Take 1 tablet by mouth every morning  Patient Instructions: 1)  purchase a plastic pill dispenser for U. load, your pills weekly. 2)  Continue your current medications. 3)  Check a morning and evening blood pressure daily.  Return in two weeks for follow-up with the data and the device

## 2010-08-08 NOTE — Assessment & Plan Note (Signed)
Summary: pt will come in fasting/njr   Vital Signs:  Patient profile:   77 year old female Height:      60.75 inches Weight:      150 pounds BMI:     28.68 Temp:     98.0 degrees F oral BP sitting:   124 / 76  (left arm) Cuff size:   regular  Vitals Entered By: Kern Reap CMA Duncan Dull) (April 13, 2010 9:58 AM) CC: annual wellness exam Is Patient Diabetic? No Pain Assessment Patient in pain? no        CC:  annual wellness exam.  History of Present Illness: Momma is an 77 year old, widowed female, nonsmoker, who comes in today for Medicare wellness examination because of underlying hypertension, B12 deficiency, hypothyroidism, hyperlipidemia, mild depression,  For hypertension.  She takes atenolol, 50 mg b.i.d., Norvasc, 5 mg b.i.d., hydrocortisone@25  mg b.i.d., indapamide 2.5 mg daily, potassium 20 mEq daily, BP 124/76.  She also takes vitamin B12 1 cc monthly for B12 deficiency.  Synthroid 125 micrograms daily for hypothyroidism.  Lipitor 20 mg nightly for hyperlipidemia.  Lexapro 5 mg nightly for mild depression.  She gets routine eye care, dental care, annual mammography, tetanus unknown Pneumovax unknown.  Will hold chart seasonal flu shot today Here for Medicare AWV:  1.   Risk factors based on Past M, S, F history:.. reviewed.  No changes 2.   Physical Activities: walks daily 3.   Depression/mood: mood good on medication 4.   Hearing: normal 5.   ADL's: functions independently 6.   Fall Risk: we did not identify an 7.   Home Safety: no guns in the house 8.   Height, weight, &visual acuity:height weight, normal.  Vision normal 9.   Counseling: continue current medications, exercise and diet program 10.   Labs ordered based on risk factors: done today 11.           Referral Coordination.......none indicated 12.           Care Plan.......Marland Kitchenreviewed including living will 13.            Cognitive Assessment ......Marland Kitchenoriented x 3 able to do all her own financial affairs  independently  Allergies: 1)  ! Sulfa  Past History:  Past medical, surgical, family and social histories (including risk factors) reviewed, and no changes noted (except as noted below).  Past Medical History: Reviewed history from 12/11/2007 and no changes required. Anemia- Depression Hyperlipidemia Hypertension Hypothyroidism  Family History: Reviewed history from 12/11/2007 and no changes required. she has one brother, who is in good health 3 sisters, one died of a brain tumor one Eloise died of congestive heart failure.  The other died last year of old age.  Social History: Reviewed history from 12/11/2007 and no changes required. 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 No apparent alopecia or balding. Eyes:  No corneal or conjunctival inflammation noted. EOMI. Perrla. Funduscopic exam benign, without hemorrhages, exudates or papilledema. Vision grossly normal....bilateral cataract extraction and lens implants Ears:  External ear exam shows no significant lesions or deformities.  Otoscopic examination reveals clear canals, tympanic membranes are intact bilaterally without bulging, retraction, inflammation or discharge. Hearing is grossly normal bilaterally. Nose:  External nasal examination shows no deformity or inflammation.  Nasal mucosa are pink and moist without lesions or exudates. Mouth:  Oral mucosa and oropharynx without lesions or exudates.  Teeth in good repair. Neck:  No deformities, masses, or tenderness noted. Chest Wall:  No deformities, masses, or tenderness noted. Breasts:  No mass, nodules, thickening, tenderness, bulging, retraction,  inflamation, nipple discharge or skin changes noted.   Lungs:  Normal respiratory effort, chest expands symmetrically. Lungs are clear to auscultation, no crackles or wheezes. Heart:  Normal rate and regular rhythm. S1 and S2 normal without gallop, murmur, click, rub or other extra sounds. Abdomen:  Bowel sounds positive,abdomen soft and non-tender without masses, organomegaly or hernias noted. Rectal:  No external abnormalities noted. Normal sphincter tone. No rectal masses or tenderness. Msk:  No deformity or scoliosis noted of thoracic or lumbar spine.   Pulses:  R and L carotid,radial,femoral,dorsalis pedis and posterior tibial pulses are full and equal bilaterally Extremities:  No clubbing, cyanosis, edema, or deformity noted with normal full range of motion of all joints.   Neurologic:  No cranial nerve deficits noted. Station and gait are normal. Plantar reflexes are down-going bilaterally. DTRs are symmetrical throughout. Sensory, motor and coordinative functions appear intact. Skin:  Intact without suspicious lesions or rashes Cervical Nodes:  No lymphadenopathy noted Axillary Nodes:  No palpable lymphadenopathy Inguinal Nodes:  No significant adenopathy Psych:  Cognition and judgment appear intact. Alert and cooperative with normal attention span and concentration. No apparent delusions, illusions, hallucinations   Impression & Recommendations:  Problem # 1:  HYPOTHYROIDISM (ICD-244.9) Assessment Improved  Her updated medication list for this problem includes:    Synthroid 125 Mcg Tabs (Levothyroxine sodium) .Marland Kitchen... 1 once daily  Orders: Prescription Created Electronically [phone]) Medicare -1st Annual Wellness Visit [phone]) Urinalysis-dipstick only (Medicare patient) [phone])  Problem # 2:  HYPERLIPIDEMIA (ICD-272.4) Assessment: Improved  Her updated medication list for this problem includes:    Lipitor 20 Mg Tabs (Atorvastatin calcium) .Marland Kitchen... 1 at  bedtime  Orders: Prescription Created Electronically [phone]) Medicare -1st Annual Wellness Visit [phone]) Urinalysis-dipstick only (Medicare patient) [phone]) EKG w/ Interpretation ([phone])  Problem # 3:  DEPRESSION (ICD-311) Assessment: Improved  Her updated medication list for this problem includes:    Lexapro 10 Mg Tabs (Escitalopram oxalate) .Marland Kitchen... 1/2 at bedtime  Orders: Prescription Created Electronically [phone]) Medicare -1st Annual Wellness Visit [phone]) Urinalysis-dipstick only (Medicare patient) [phone])  Problem # 4:  EDEMA- LOCALIZED (ICD-782.3) Assessment: Improved  Her updated medication list for this problem includes:    Indapamide 2.5 Mg Tabs (Indapamide) .Marland Kitchen... 1 once daily    Hydrochlorothiazide 25 Mg Tabs (Hydrochlorothiazide) .Marland Kitchen... Take 1 tablet by mouth two times a day  Orders: Prescription Created Electronically [phone]) Medicare -1st Annual Wellness Visit [phone]) Urinalysis-dipstick only (Medicare patient) [phone])  Problem # 5:  ROUTINE GENERAL MEDICAL EXAM@HEALTH  CARE FACL (ICD-V70.0) Assessment: Unchanged  Orders: Prescription Created Electronically [phone]) Medicare -1st Annual Wellness Visit [phone]) Urinalysis-dipstick only (Medicare patient) ([id number])  Complete Medication List: 1)  Atenolol 50 Mg Tabs (Atenolol) .Marland Kitchen.. 1 tablet by mouth twice a day 2)  Cyanocobalamin 1000 Mcg/ml Soln (Cyanocobalamin) .... Administer 1 ml intramuscularly as directed 3)  Indapamide 2.5 Mg Tabs (Indapamide) .Marland Kitchen.. 1 once daily 4)  Lexapro 10 Mg Tabs (Escitalopram oxalate) .... 1/2 at bedtime 5)  Lipitor 20 Mg Tabs (Atorvastatin calcium) .Marland Kitchen.. 1 at bedtime 6)  Synthroid 125 Mcg Tabs (Levothyroxine sodium) .Marland Kitchen.. 1 once daily 7)  Bd Disp Needles 27g X 1/2" Misc (Needle (disp)) .... Use for b12 injections 8)  Norvasc 5 Mg Tabs (Amlodipine besylate) .... Take 1 tablet by mouth two times a day 9)  Hydrochlorothiazide 25 Mg Tabs (Hydrochlorothiazide) .... Take 1 tablet by  mouth two times a day 10)  Effer-k 20 Meq Tbef (Potassium bicarb-citric acid) .... Take 1 tablet by mouth every morning  Patient Instructions: 1)  Please schedule a follow-up appointment in 1 year. 2)  It is important that you exercise regularly at least 20 minutes 5 times a week. If you develop chest pain, have severe difficulty breathing, or feel very tired , stop exercising immediately and seek medical attention. 3)  Schedule your mammogram. 4)  Schedule a colonoscopy/sigmoidoscopy to help detect colon cancer. 5)  Take calcium +Vitamin D daily. 6)  Take an Aspirin ..........81 mg  Monday Wednesday, Friday because of the bruising Prescriptions: HYDROCHLOROTHIAZIDE 25 MG TABS (HYDROCHLOROTHIAZIDE) Take 1 tablet by mouth two times a day  #200 x 3   Entered and Authorized by:   Roderick Pee MD   Signed by:   Roderick Pee MD on 04/13/2010   Method used:   [facility]        [facility] [address] [phone]* (retail)       [address]       [place], [place]  [address]       Ph: [phone] or [phone]       Fax: [phone]   RxID:   [id number] NORVASC 5 MG TABS (AMLODIPINE BESYLATE) Take 1 tablet by mouth two times a day  #100 Tablet x 3   Entered and Authorized by:   Roderick Pee MD   Signed by:   Roderick Pee MD on 04/13/2010   Method used:   Electronically to        [facility] [address] [phone]* (retail)       [address]       [place], [place]  [address]       Ph: [phone] or [phone]       Fax: [phone]   RxID:   [id number] BD DISP NEEDLES 27G X 1/2" MISC (NEEDLE (DISP)) use for b12 injections Brand medically necessary #12 Each x 1   Entered and Authorized by:   Roderick Pee MD   Signed by:   Roderick Pee MD on 04/13/2010   Method used:   Electronically to        [facility] [address] [phone]* (retail)       [address]       [place], [place]  [address]       Ph:  [phone] or [phone]       Fax: [phone]   RxID:   [id number] SYNTHROID 125 MCG TABS (LEVOTHYROXINE SODIUM) 1 once daily  #100 Tablet x 3   Entered and Authorized by:   Roderick Pee MD   Signed by:   Roderick Pee MD on 04/13/2010   Method used:   Electronically to        [facility] [address] [phone]* (retail)       [address]       [place], [place]  [address]       Ph: [phone] or [phone]       Fax: [phone]   RxID:  [id number] LIPITOR 20 MG TABS (ATORVASTATIN CALCIUM) 1 at bedtime  #90 Tablet x 3   Entered and Authorized by:   Roderick Pee MD   Signed by:   Roderick Pee MD on 04/13/2010   Method used:   Electronically to        [facility] [address] [phone]* (retail)       [address]       [place], [place]  [address]       Ph: [phone] or [phone]       Fax: [phone]   RxID:   [id number] LEXAPRO 10 MG TABS (ESCITALOPRAM OXALATE) 1/2 at bedtime  #45 x 3   Entered and Authorized by:   Roderick Pee MD   Signed by:   Roderick Pee MD on 04/13/2010   Method used:   Electronically to        [facility] [address] [phone]* (retail)       [address]       [place], [place]  [address]       Ph: [phone] or [phone]       Fax: [phone]   RxID:   [id number] INDAPAMIDE 2.5 MG TABS (INDAPAMIDE) 1 once daily  #100 Tablet x 3   Entered and Authorized by:   Roderick Pee MD   Signed by:   Roderick Pee MD on 04/13/2010   Method used:   Electronically to        [facility] [address] [phone]* (retail)       [address]       [place], [place]  [address]       Ph: [phone] or [phone]       Fax: [phone]   RxID:   [id number] CYANOCOBALAMIN 1000 MCG/ML SOLN (CYANOCOBALAMIN) Administer 1 ml intramuscularly as directed  #10 [place] x 2   Entered and Authorized by:   Roderick Pee MD   Signed by:   Roderick Pee MD on 04/13/2010   Method used:   Electronically to        [facility] [address] [phone]* (retail)       [address]       [place], [place]  [address]       Ph: [phone] or [phone]       Fax: [phone]   RxID:   [id number] ATENOLOL 50 MG TABS (ATENOLOL) 1 tablet by mouth twice a day  #200 Tablet x 3   Entered and Authorized by:   Roderick Pee MD   Signed by:   Roderick Pee MD on 04/13/2010   Method used:   Electronically to        [facility] [address] [phone]* (retail)       [address]       [place], [place]  [address]       Ph: [phone] or [phone]       Fax: [phone]   RxID:   [id number]     Laboratory Results   Urine Tests  Date/Time Recieved: April 13, 2010 12:57 PM  Date/Time Reported: April 13, 2010 12:57 PM   Routine Urinalysis   Color: yellow Appearance: Clear Glucose: negative   (  Normal Range: Negative) Bilirubin: negative   (Normal Range: Negative) Ketone: negative   (Normal Range: Negative) Spec. Gravity: 1.015   (Normal Range: 1.003-1.035) Blood: 1+   (Normal Range: Negative) pH: 7.0   (Normal Range: 5.0-8.0) Protein: negative   (Normal Range: Negative) Urobilinogen: 0.2   (Normal Range: 0-1) Nitrite: negative   (Normal Range: Negative) Leukocyte Esterace: trace   (Normal Range: Negative)    Comments: Wynona Canes, CMA  April 13, 2010 12:57 PM

## 2010-08-11 ENCOUNTER — Other Ambulatory Visit: Payer: Self-pay | Admitting: Family Medicine

## 2010-08-24 ENCOUNTER — Other Ambulatory Visit: Payer: Self-pay | Admitting: Family Medicine

## 2010-10-21 ENCOUNTER — Other Ambulatory Visit: Payer: Self-pay | Admitting: Family Medicine

## 2010-10-24 LAB — URINALYSIS, DIPSTICK ONLY
Bilirubin Urine: NEGATIVE
Hgb urine dipstick: NEGATIVE
Nitrite: NEGATIVE
Protein, ur: NEGATIVE mg/dL
Specific Gravity, Urine: 1.014 (ref 1.005–1.030)
Urobilinogen, UA: 1 mg/dL (ref 0.0–1.0)

## 2010-10-24 LAB — CARDIAC PANEL(CRET KIN+CKTOT+MB+TROPI)
CK, MB: 1.4 ng/mL (ref 0.3–4.0)
CK, MB: 1.5 ng/mL (ref 0.3–4.0)
Relative Index: INVALID (ref 0.0–2.5)
Relative Index: INVALID (ref 0.0–2.5)
Relative Index: INVALID (ref 0.0–2.5)
Troponin I: <0.01 ng/mL (ref 0.00–0.06)
Troponin I: <0.01 ng/mL (ref 0.00–0.06)
Troponin I: <0.01 ng/mL (ref 0.00–0.06)

## 2010-10-24 LAB — COMPREHENSIVE METABOLIC PANEL
BUN: 10 mg/dL (ref 6–23)
CO2: 27 mEq/L (ref 19–32)
Calcium: 8.7 mg/dL (ref 8.4–10.5)
Chloride: 108 mEq/L (ref 96–112)
Creatinine, Ser: 0.78 mg/dL (ref 0.4–1.2)
GFR calc Af Amer: >60 mL/min (ref >60)
GFR calc non Af Amer: >60 mL/min (ref >60)
Glucose, Bld: 129 mg/dL — ABNORMAL HIGH (ref 70–99)
Total Bilirubin: 0.9 mg/dL (ref 0.3–1.2)

## 2010-10-24 LAB — DIFFERENTIAL
Basophils Absolute: 0 10*3/uL (ref 0.0–0.1)
Lymphocytes Relative: 17 % (ref 12–46)
Lymphs Abs: 1.2 10*3/uL (ref 0.7–4.0)
Neutrophils Relative %: 76 % (ref 43–77)

## 2010-10-24 LAB — CBC
HCT: 39.7 % (ref 36.0–46.0)
Hemoglobin: 13.5 g/dL (ref 12.0–15.0)
MCHC: 33.9 g/dL (ref 30.0–36.0)
MCV: 91.8 fL (ref 78.0–100.0)
RBC: 4.33 MIL/uL (ref 3.87–5.11)

## 2010-11-24 NOTE — H&P (Signed)
NAME:  Maria Clarke, Maria Clarke                          ACCOUNT NO.:  [id number]   MEDICAL RECORD NO.:  [id number]                   PATIENT TYPE:  AMB   LOCATION:  [facility]                                  FACILITY:  [facility]   PHYSICIAN:  Hermelinda Medicus, M.D.                DATE OF BIRTH:  05/17/1922   DATE OF ADMISSION:  08/25/2002  DATE OF DISCHARGE:                                HISTORY & PHYSICAL   HISTORY OF PRESENT ILLNESS:  The patient was first seen under my care when  she came in with epistaxis in 2002.  She was seen in the ER where I placed a  pack within her nose.  She had been taking a considerable amount of aspirin  and she had a coagulation workup which will be discussed.  She had also had  a considerable amount of drainage down the back of her throat and we  obtained a CAT scan which showed evidence of multiple myeloma.  Dr. Lenord Fellers  was in the process of evaluating this and she is now apparently under Dr.  Nelida Meuse care, who is evaluating it.  She also had evidence of sinus problem  where she had a concha bullosa noted, hypertrophy of the inferior turbinates  bilaterally, but there does not appear to be significant sinus disease.  There are scattered foci of lucency within the calvarium with findings  consistent with multiple myeloma.  She has done reasonably well, but more  recently she has now developed a lesion on the right base of her tongue.  We  first saw this and it appeared to be a small papilloma.  This has increased  in size and now we feel that it is causing her to cough.  She now enters for  the removal of this lesion.  Some of her past lab work has shown excellent,  normal results.  She takes nothing more than a baby aspirin at this time,  although at one time she was on Coumadin and she has done quite well.  She  now enters for a direct laryngoscopy and biopsy of this lesion on the base  of her tongue.   PAST MEDICATIONS:  1. Synthroid.  2. Atenolol.  3. Lipitor.  4. Calcium.  5. Baby aspirin.   PAST MEDICAL HISTORY:  1. Breast biopsy.  2. Elbow pain secondary to fracture.  3. Cataract history.  4. No bowel, bladder or kidney problems.  5. She has never had any cardiac problems, except she does have mild     hypertension and her blood pressure today is 182/85.   PHYSICAL EXAMINATION:  VITAL SIGNS:  Blood pressure 182/85, heart rate 71,  respirations 18, weight 150 pounds.  HEENT:  Ears are clear.  Oral cavity is clear.  No evidence of any  ulceration of mass.  On the base of her tongue, however, on laryngeal  examination she has a brownish,  raised, exophytic type polyp that is  approximately 1 cm in size and appears to be increasing in size on the right  base of her tongue near the lingual tonsils.  The piriform is clear.  The  larynx is otherwise clear.  True cords, false cords, epiglottis and base of  tongue clear of any ulceration or mass.  NECK:  Free of any thyromegaly, cervical adenopathy or mass.  CHEST:  No rales, rhonchi or wheezes.  CARDIAC:  No murmur, rub or gallop.  EKG noted which has been checked by  anesthesiologist stated normal sinus rhythm and her chest x-ray has also  shown no active disease.  ABDOMEN:  Unremarkable.  EXTREMITIES:  Unremarkable.   ASSESSMENT:  1. Right base of tongue, lingual tonsil, pre-epiglottic space lesion, 1 cm     in size, raised, polypoid, rule out malignancy.  2. History of multiple myeloma by x-ray diagnosis.  3. History of hypothyroidism.  4. Blood pressure elevation.  5. Arteriosclerotic cardiovascular disease.  6. History of breast biopsy.  7. Elbow fracture.  8. Cataract surgery.                                               Hermelinda Medicus, M.D.    JC/MEDQ  D:  08/25/2002  T:  08/25/2002  Job:  [id number]   cc:   Tinnie Gens A. Tawanna Cooler, M.D. [facility]

## 2010-11-24 NOTE — Op Note (Signed)
   NAME:  Maria Clarke, Maria Clarke                          ACCOUNT NO.:  [id number]   MEDICAL RECORD NO.:  [id number]                   PATIENT TYPE:  AMB   LOCATION:  [facility]                                  FACILITY:  [facility]   PHYSICIAN:  Maria Clarke Medicus, M.D.                DATE OF BIRTH:  1922/01/24   DATE OF PROCEDURE:  08/25/2002  DATE OF DISCHARGE:                                 OPERATIVE REPORT   PREOPERATIVE DIAGNOSIS:  Right base of tongue lesion, rule out papilloma,  rule out squamous cell carcinoma.   POSTOPERATIVE DIAGNOSIS:  Right base of tongue lesion, rule out papilloma,  rule out squamous cell carcinoma.   OPERATION:  Direct laryngoscopy and biopsy base of tongue, biopsy excision.   SURGEON:  Maria Clarke Medicus, M.D.   ANESTHESIA:  General endotracheal   DESCRIPTION OF PROCEDURE:  With the patient in a supine position, under  general endotracheal anesthesia, the Jako laryngoscope was used and this was  used to evaluate the entire larynx.  The piriforms were clear.  The  epiglottis and base of tongue were evaluated and the pre-epiglottic space as  well as the larynx itself and true cords.  These were all found to be clear  except there was a 1 cm lesion at the base of tongue on the right side which  was focused in using the Louie suspension and then using the Bovie  electrocoagulation along with the cuffed forceps, we were able to excise  this and Bovie electrocoagulate the stalk of this polypoid material.  Once  this was completely removed and the larynx was completely evaluated, the  scope was slowly retracted.  The Louie suspension of course was taken down.  The scope was retracted and then the patient was awakened.  Tolerated the  procedure well and is doing well postoperatively.  Follow up will be in one  week, three weeks and six weeks.                                               Maria Clarke Medicus, M.D.    JC/MEDQ  D:  08/25/2002  T:  08/25/2002  Job:  [id number]   cc:    Tinnie Gens A. Tawanna Cooler, M.D. [facility]

## 2010-12-02 ENCOUNTER — Other Ambulatory Visit: Payer: Self-pay | Admitting: Family Medicine

## 2011-02-05 ENCOUNTER — Other Ambulatory Visit: Payer: Self-pay | Admitting: Family Medicine

## 2011-02-20 ENCOUNTER — Other Ambulatory Visit: Payer: Self-pay | Admitting: Family Medicine

## 2011-03-31 ENCOUNTER — Other Ambulatory Visit: Payer: Self-pay | Admitting: Family Medicine

## 2011-04-05 ENCOUNTER — Other Ambulatory Visit: Payer: Self-pay | Admitting: Family Medicine

## 2011-04-11 ENCOUNTER — Other Ambulatory Visit: Payer: Self-pay | Admitting: Family Medicine

## 2011-04-17 ENCOUNTER — Other Ambulatory Visit: Payer: Self-pay | Admitting: Family Medicine

## 2011-04-18 ENCOUNTER — Other Ambulatory Visit: Payer: Self-pay | Admitting: Family Medicine

## 2011-04-30 ENCOUNTER — Other Ambulatory Visit: Payer: Self-pay | Admitting: Family Medicine

## 2011-05-05 ENCOUNTER — Other Ambulatory Visit: Payer: Self-pay | Admitting: Family Medicine

## 2011-05-07 ENCOUNTER — Other Ambulatory Visit: Payer: Self-pay | Admitting: Family Medicine

## 2011-05-15 ENCOUNTER — Other Ambulatory Visit: Payer: Self-pay | Admitting: Family Medicine

## 2011-05-15 NOTE — Telephone Encounter (Signed)
rx sent

## 2011-05-15 NOTE — Telephone Encounter (Signed)
Pt is needing to get refill of Klor-Con 20 mg to [facility]. Pt went by pharmacy at 12pm today and script was not a pharmacy. Also pt is req to get copy of current med list.

## 2011-05-16 ENCOUNTER — Other Ambulatory Visit: Payer: Self-pay | Admitting: Family Medicine

## 2011-05-17 ENCOUNTER — Other Ambulatory Visit: Payer: Self-pay | Admitting: Family Medicine

## 2011-06-13 ENCOUNTER — Other Ambulatory Visit: Payer: Self-pay | Admitting: Family Medicine

## 2011-06-20 ENCOUNTER — Ambulatory Visit (INDEPENDENT_AMBULATORY_CARE_PROVIDER_SITE_OTHER): Payer: Medicare Other | Admitting: Family Medicine

## 2011-06-20 ENCOUNTER — Encounter: Payer: Self-pay | Admitting: Family Medicine

## 2011-06-20 DIAGNOSIS — D649 Anemia, unspecified: Secondary | ICD-10-CM

## 2011-06-20 DIAGNOSIS — Z23 Encounter for immunization: Secondary | ICD-10-CM

## 2011-06-20 DIAGNOSIS — I10 Essential (primary) hypertension: Secondary | ICD-10-CM

## 2011-06-20 DIAGNOSIS — E785 Hyperlipidemia, unspecified: Secondary | ICD-10-CM

## 2011-06-20 DIAGNOSIS — Z0000 Encounter for general adult medical examination without abnormal findings: Secondary | ICD-10-CM

## 2011-06-20 DIAGNOSIS — F329 Major depressive disorder, single episode, unspecified: Secondary | ICD-10-CM

## 2011-06-20 DIAGNOSIS — F3289 Other specified depressive episodes: Secondary | ICD-10-CM

## 2011-06-20 DIAGNOSIS — R609 Edema, unspecified: Secondary | ICD-10-CM

## 2011-06-20 DIAGNOSIS — E039 Hypothyroidism, unspecified: Secondary | ICD-10-CM

## 2011-06-20 DIAGNOSIS — R319 Hematuria, unspecified: Secondary | ICD-10-CM

## 2011-06-20 LAB — POCT URINALYSIS DIPSTICK
Leukocytes, UA: NEGATIVE
Nitrite, UA: NEGATIVE
Protein, UA: NEGATIVE
pH, UA: 6.5

## 2011-06-20 LAB — CBC WITH DIFFERENTIAL/PLATELET
Basophils Absolute: 0 10*3/uL (ref 0.0–0.1)
HCT: 42.6 % (ref 36.0–46.0)
Lymphs Abs: 2 10*3/uL (ref 0.7–4.0)
MCHC: 34 g/dL (ref 30.0–36.0)
MCV: 91.1 fl (ref 78.0–100.0)
Monocytes Absolute: 0.5 10*3/uL (ref 0.1–1.0)
Platelets: 165 10*3/uL (ref 150.0–400.0)
RDW: 14.5 % (ref 11.5–14.6)

## 2011-06-20 LAB — HEPATIC FUNCTION PANEL
AST: 26 U/L (ref 0–37)
Alkaline Phosphatase: 55 U/L (ref 39–117)
Bilirubin, Direct: 0.1 mg/dL (ref 0.0–0.3)
Total Bilirubin: 0.6 mg/dL (ref 0.3–1.2)

## 2011-06-20 LAB — BASIC METABOLIC PANEL
BUN: 13 mg/dL (ref 6–23)
Chloride: 102 mEq/L (ref 96–112)
Creatinine, Ser: 0.8 mg/dL (ref 0.4–1.2)
GFR: 74.14 mL/min (ref >60.00)
Glucose, Bld: 106 mg/dL — ABNORMAL HIGH (ref 70–99)

## 2011-06-20 LAB — LIPID PANEL
Cholesterol: 183 mg/dL (ref 0–200)
LDL Cholesterol: 96 mg/dL (ref 0–99)
Triglycerides: 165 mg/dL — ABNORMAL HIGH (ref 0.0–149.0)

## 2011-06-20 MED ORDER — LEVOTHYROXINE SODIUM 125 MCG PO TABS: 125.0000 ug | ORAL_TABLET | Freq: Every day | ORAL | Status: DC

## 2011-06-20 MED ORDER — ATENOLOL 50 MG PO TABS: 50.0000 mg | ORAL_TABLET | Freq: Two times a day (BID) | ORAL | Status: DC

## 2011-06-20 MED ORDER — ESCITALOPRAM OXALATE 10 MG PO TABS: 10.0000 mg | ORAL_TABLET | Freq: Every day | ORAL | Status: DC

## 2011-06-20 MED ORDER — "SYRINGE/NEEDLE (DISP) 25G X 5/8"" 3 ML MISC": 1.0000 | Status: DC

## 2011-06-20 MED ORDER — INDAPAMIDE 2.5 MG PO TABS: 2.5000 mg | ORAL_TABLET | Freq: Every day | ORAL | Status: DC

## 2011-06-20 MED ORDER — CYANOCOBALAMIN 1000 MCG/ML IJ SOLN: 1000.0000 ug | Freq: Once | INTRAMUSCULAR | Status: DC

## 2011-06-20 MED ORDER — HYDROCHLOROTHIAZIDE 25 MG PO TABS: 25.0000 mg | ORAL_TABLET | Freq: Every day | ORAL | Status: DC

## 2011-06-20 MED ORDER — ATORVASTATIN CALCIUM 20 MG PO TABS: 20.0000 mg | ORAL_TABLET | Freq: Every day | ORAL | Status: DC

## 2011-06-20 MED ORDER — POTASSIUM CHLORIDE CRYS ER 20 MEQ PO TBCR: 20.0000 meq | EXTENDED_RELEASE_TABLET | Freq: Every day | ORAL | Status: DC

## 2011-06-20 MED ORDER — AMLODIPINE BESYLATE 5 MG PO TABS: 5.0000 mg | ORAL_TABLET | Freq: Two times a day (BID) | ORAL | Status: DC

## 2011-06-20 NOTE — Progress Notes (Signed)
  Subjective:    Patient ID: Maria Clarke, female    DOB: 1921/10/04, 77 y.o.   MRN: [id number]  HPI Maria Clarke is a delightful, 77 year old, widowed female nonsmoker comes in today for Medicare wellness examination because of a history of hypertension, hyperlipidemia, B12 deficiency, mild depression, hypothyroidism.  Her medications were reviewed in detail, and there have been no changes see the medicine list.  She gets routine eye care, hearing normal, regular dental care, walks on a daily basis, no longer needs colonoscopy or mammography, tetanus booster 2005, seasonal flu shot today, Pneumovax, x 2, information given on shingles.  Cognitive function, normal.  She walks on a daily basis.  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  Home health safety reviewed.  No issues identified, there are guns in the house, but their locked up.  She does have a healthcare power of attorney and living will.   Review of Systems  Constitutional: Negative.   HENT: Negative.   Eyes: Negative.   Respiratory: Negative.   Cardiovascular: Negative.   Gastrointestinal: Negative.   Genitourinary: Negative.   Musculoskeletal: Negative.   Neurological: Negative.   Hematological: Negative.   Psychiatric/Behavioral: Negative.        Objective:   Physical Exam  Constitutional: She appears well-developed and well-nourished.  HENT:  Head: Normocephalic and atraumatic.  Right Ear: External ear normal.  Left Ear: External ear normal.  Nose: Nose normal.  Mouth/Throat: Oropharynx is clear and moist.  Eyes: EOM are normal. Pupils are equal, round, and reactive to light.  Neck: Normal range of motion. Neck supple. No thyromegaly present.  Cardiovascular: Normal rate, regular rhythm, normal heart sounds and intact distal pulses.  Exam reveals no gallop and no friction rub.   No murmur heard. Pulmonary/Chest: Effort normal and breath sounds normal.  Abdominal: Soft. Bowel sounds are normal. She  exhibits no distension and no mass. There is no tenderness. There is no rebound.  Genitourinary:       Bilateral breast exam normal.  Scar left breast at the two o'clock position from previous benign biopsy.  No palpable abnormalities  Musculoskeletal: Normal range of motion.  Lymphadenopathy:    She has no cervical adenopathy.  Neurological: She is alert. She has normal reflexes. No cranial nerve deficit. She exhibits normal muscle tone. Coordination normal.  Skin: Skin is warm and dry.  Psychiatric: She has a normal mood and affect. Her behavior is normal. Judgment and thought content normal.          Assessment & Plan:  Healthy female.  Hypertension.  Continue current medication.  Hyper lipidemia continue current medication.  Vitamin B12 deficiency.  Continue B12 shots at home monthly.  Mild depression.  Continue Lexapro 10 mg nightly  Hypo-thyroidism.  Continue Synthroid one daily.  Return in one year, sooner if any problems

## 2011-06-20 NOTE — Patient Instructions (Signed)
Continue your current medications  Since your blood pressure is under good control just check your blood pressure weekly.  Return p.r.n.,,,,,,,,,, one year for your annual exam and sooner if any problems

## 2011-06-26 NOTE — Progress Notes (Signed)
Quick Note:  Pt aware ______ 

## 2011-07-11 ENCOUNTER — Other Ambulatory Visit: Payer: Self-pay | Admitting: Family Medicine

## 2011-08-14 ENCOUNTER — Ambulatory Visit (INDEPENDENT_AMBULATORY_CARE_PROVIDER_SITE_OTHER): Payer: Medicare Other | Admitting: Family Medicine

## 2011-08-14 DIAGNOSIS — Z2911 Encounter for prophylactic immunotherapy for respiratory syncytial virus (RSV): Secondary | ICD-10-CM | POA: Diagnosis not present

## 2011-08-14 DIAGNOSIS — Z0000 Encounter for general adult medical examination without abnormal findings: Secondary | ICD-10-CM

## 2011-11-24 ENCOUNTER — Other Ambulatory Visit: Payer: Self-pay | Admitting: Family Medicine

## 2011-12-04 ENCOUNTER — Other Ambulatory Visit: Payer: Self-pay | Admitting: Family Medicine

## 2012-02-25 DIAGNOSIS — Z961 Presence of intraocular lens: Secondary | ICD-10-CM | POA: Diagnosis not present

## 2012-03-01 ENCOUNTER — Other Ambulatory Visit: Payer: Self-pay | Admitting: Family Medicine

## 2012-03-18 ENCOUNTER — Telehealth: Payer: Self-pay | Admitting: Family Medicine

## 2012-03-18 DIAGNOSIS — D649 Anemia, unspecified: Secondary | ICD-10-CM

## 2012-03-18 MED ORDER — "SYRINGE/NEEDLE (DISP) 25G X 5/8"" 3 ML MISC": 1.0000 | Status: DC

## 2012-03-18 MED ORDER — CYANOCOBALAMIN 1000 MCG/ML IJ SOLN: 1000.0000 ug | Freq: Once | INTRAMUSCULAR | Status: DC

## 2012-03-18 NOTE — Telephone Encounter (Signed)
Rx sent 

## 2012-03-18 NOTE — Telephone Encounter (Signed)
Pt requesting refill on cyanocobalamin (,VITAMIN B-12,) 1000 MCG/ML injection and  SYRINGE-NEEDLE, DISP, 3 ML 25G X 5/8" 3 ML MISC  Pt states that when rx called in she is requesting a big bottle of b12 she stated that she has a hard time handling the small bottle she has had in the past [facility]

## 2012-05-07 ENCOUNTER — Other Ambulatory Visit: Payer: Self-pay | Admitting: Family Medicine

## 2012-06-27 ENCOUNTER — Other Ambulatory Visit: Payer: Self-pay | Admitting: Family Medicine

## 2012-07-27 ENCOUNTER — Other Ambulatory Visit: Payer: Self-pay | Admitting: Family Medicine

## 2012-08-11 ENCOUNTER — Other Ambulatory Visit: Payer: Self-pay | Admitting: Family Medicine

## 2012-09-04 ENCOUNTER — Other Ambulatory Visit: Payer: Self-pay | Admitting: Family Medicine

## 2012-09-27 ENCOUNTER — Other Ambulatory Visit: Payer: Self-pay | Admitting: Family Medicine

## 2012-10-08 ENCOUNTER — Ambulatory Visit (INDEPENDENT_AMBULATORY_CARE_PROVIDER_SITE_OTHER): Payer: Medicare Other | Admitting: Family Medicine

## 2012-10-08 ENCOUNTER — Encounter: Payer: Self-pay | Admitting: Family Medicine

## 2012-10-08 VITALS — BP 130/72 | HR 68 | Temp 98.2°F | Wt 142.0 lb

## 2012-10-08 DIAGNOSIS — M79609 Pain in unspecified limb: Secondary | ICD-10-CM

## 2012-10-08 DIAGNOSIS — M79605 Pain in left leg: Secondary | ICD-10-CM

## 2012-10-08 NOTE — Progress Notes (Signed)
Chief Complaint  Patient presents with  . severe leg pain    left; started about 5 days ago     HPI:  Acute visit for L leg pain: -thinks she "pulled" a muscle  -started 3 or 4 days ago after walking too much after all day shopping -advil and rest helps it -worse with with activity  ROS: See pertinent positives and negatives per HPI.  No past medical history on file.  No family history on file.  History   Social History  . Marital Status: Single    Spouse Name: N/A    Number of Children: N/A  . Years of Education: N/A   Social History Main Topics  . Smoking status: Never Smoker   . Smokeless tobacco: None  . Alcohol Use:   . Drug Use:   . Sexually Active:    Other Topics Concern  . None   Social History Narrative  . None    Current outpatient prescriptions:amLODipine (NORVASC) 5 MG tablet, Take 1 tablet (5 mg total) by mouth 2 (two) times daily., Disp: 200 tablet, Rfl: 3;  amLODipine (NORVASC) 5 MG tablet, TAKE 1 TABLET BY MOUTH TWO TIMES A DAY, Disp: 200 tablet, Rfl: 3;  atenolol (TENORMIN) 50 MG tablet, TAKE 1 TABLET TWICE DAILY, Disp: 200 tablet, Rfl: 1;  atenolol (TENORMIN) 50 MG tablet, TAKE 1 TABLET TWICE DAILY, Disp: 200 tablet, Rfl: 3 atenolol (TENORMIN) 50 MG tablet, TAKE 1 TABLET (50 MG TOTAL) BY MOUTH 2 (TWO) TIMES DAILY., Disp: 200 tablet, Rfl: 0;  atorvastatin (LIPITOR) 20 MG tablet, Take 1 tablet (20 mg total) by mouth daily., Disp: 100 tablet, Rfl: 3;  cyanocobalamin (,VITAMIN B-12,) 1000 MCG/ML injection, Inject 1 mL (1,000 mcg total) into the skin once., Disp: 30 mL, Rfl: 1 escitalopram (LEXAPRO) 10 MG tablet, Take 1 tablet (10 mg total) by mouth daily., Disp: 100 tablet, Rfl: 3;  hydrochlorothiazide (HYDRODIURIL) 25 MG tablet, TAKE 1 TABLET BY MOUTH TWO TIMES A DAY, Disp: 200 tablet, Rfl: 3;  hydrochlorothiazide (HYDRODIURIL) 25 MG tablet, TAKE 1 TABLET (25 MG TOTAL) BY MOUTH DAILY., Disp: 100 tablet, Rfl: 2;  indapamide (LOZOL) 2.5 MG tablet, Take 1  tablet (2.5 mg total) by mouth daily., Disp: 100 tablet, Rfl: 3 indapamide (LOZOL) 2.5 MG tablet, TAKE 1 TABLET BY MOUTH EVERY DAY, Disp: 100 tablet, Rfl: 2;  KLOR-CON M20 20 MEQ tablet, TAKE 1 TABLET BY MOUTH EVERY MORNING, Disp: 100 tablet, Rfl: 2;  levothyroxine (SYNTHROID, LEVOTHROID) 125 MCG tablet, TAKE 1 TABLET (125 MCG TOTAL) BY MOUTH DAILY., Disp: 100 tablet, Rfl: 3;  LEXAPRO 10 MG tablet, TAKE 1/2 TABLET BY MOUTH EVERY MORNING, Disp: 90 tablet, Rfl: 2 potassium chloride SA (KLOR-CON M20) 20 MEQ tablet, Take 1 tablet (20 mEq total) by mouth daily., Disp: 100 tablet, Rfl: 3;  SYRINGE-NEEDLE, DISP, 3 ML 25G X 5/8" 3 ML MISC, 1 each by Does not apply route every 30 (thirty) days., Disp: 50 each, Rfl: 1  EXAM:  Filed Vitals:   10/08/12 0931  BP: 130/72  Pulse: 68  Temp: 98.2 F (36.8 C)    Body mass index is 26.4 kg/(m^2).  GENERAL: vitals reviewed and listed above, alert, oriented, appears well hydrated and in no acute distress  HEENT: atraumatic, conjunttiva clear, no obvious abnormalities on inspection of external nose and ears  NECK: no obvious masses on inspection  MS: moves all extremities without noticeable abnormality -normal gait -no deformity, erythema or swelling of leg -ttp walker's muscle -otherwise normal exam of  extremities, NV intact  PSYCH: pleasant and cooperative, no obvious depression or anxiety  ASSESSMENT AND PLAN:  Discussed the following assessment and plan:  Leg pain, left  -muscle soreness after extensive activity - advised supportive recs below and follow up with PCP in 4 weeks if not resolved or sooner if worsens. -Patient advised to return or notify a doctor immediately if symptoms worsen or persist or new concerns arise.  Patient Instructions  -heat for 15 minutes twice daily - be careful not to burn skin  -plenty of water  -tylenol 500mg  up to 3 times daily  -topical sports creams with capsaicin or menthol  -follow up with your  doctor if worsens or persists in 4 weeks     Enrigue Hashimi R.

## 2012-10-08 NOTE — Patient Instructions (Addendum)
-  heat for 15 minutes twice daily - be careful not to burn skin  -plenty of water  -tylenol 500mg  up to 3 times daily  -topical sports creams with capsaicin or menthol  -follow up with your doctor if worsens or persists in 4 weeks

## 2012-10-15 ENCOUNTER — Ambulatory Visit: Payer: Medicare Other | Admitting: Family Medicine

## 2012-10-27 ENCOUNTER — Ambulatory Visit (INDEPENDENT_AMBULATORY_CARE_PROVIDER_SITE_OTHER): Payer: Medicare Other | Admitting: Family Medicine

## 2012-10-27 ENCOUNTER — Encounter: Payer: Self-pay | Admitting: Family Medicine

## 2012-10-27 ENCOUNTER — Ambulatory Visit (INDEPENDENT_AMBULATORY_CARE_PROVIDER_SITE_OTHER)
Admission: RE | Admit: 2012-10-27 | Discharge: 2012-10-27 | Disposition: A | Discharge status: Home / Self Care | Payer: Medicare Other | Source: Ambulatory Visit | Attending: Family Medicine | Admitting: Family Medicine

## 2012-10-27 VITALS — BP 92/56 | Temp 98.5°F | Wt 141.0 lb

## 2012-10-27 DIAGNOSIS — M79662 Pain in left lower leg: Secondary | ICD-10-CM

## 2012-10-27 DIAGNOSIS — M79609 Pain in unspecified limb: Secondary | ICD-10-CM

## 2012-10-27 NOTE — Progress Notes (Signed)
  Subjective:    Patient ID: Maria Clarke, female    DOB: 1922/01/02, 79 y.o.   MRN: [id number]  HPI Maria Clarke is a 79 year old female widowed nonsmoker who comes in today for evaluation of pain in her left lower leg for 6 weeks  She states about 6 weeks ago she noticed some pain in the lateral side of her left lower leg. No history of trauma. She came in and saw Dr. Selena Batten.  Exam was negative and she was treated symptomatically with Motrin 2 tabs 4 times a day. She says today the pain is gone.  Review of systems otherwise negative   Review of Systems    review of systems negative Objective:   Physical Exam  Well-developed well nourished female no acute distress examination the left lower leg shows the leg appears to be normal pulses normal skin and all ankle and knee are normal. No palpable tenderness.      Assessment & Plan:  Leg pain left lower extremity etiology unknown now pain-free  Low blood pressure plan decrease blood pressure medication followup in one week

## 2012-10-27 NOTE — Patient Instructions (Signed)
Go to the main office now for x-ray of your left lower leg  Decrease her Tenormin one tablet daily in the morning  Check your blood pressure daily in the morning  Return in one week for followup

## 2012-11-03 ENCOUNTER — Encounter: Payer: Self-pay | Admitting: Family Medicine

## 2012-11-03 ENCOUNTER — Ambulatory Visit (INDEPENDENT_AMBULATORY_CARE_PROVIDER_SITE_OTHER): Payer: Medicare Other | Admitting: Family Medicine

## 2012-11-03 VITALS — BP 150/70 | Temp 98.0°F | Wt 140.0 lb

## 2012-11-03 DIAGNOSIS — I10 Essential (primary) hypertension: Secondary | ICD-10-CM

## 2012-11-03 NOTE — Patient Instructions (Addendum)
Purchased a new digital pump up blood pressure cuff............omron or reli-on,,,,,,,, and check your blood pressure daily in the morning  Norvasc 5 mg,,,,,,,,, one tablet twice daily  Tenormin 50 mg,,,,, one tablet daily in the morning  Hydrochlorothiazide 25 mg,,,, one tablet daily in the morning,,,,,,,,,,,,,,,,,,,,,,,,,,,,,return in 3 weeks with the data and the new machine

## 2012-11-03 NOTE — Progress Notes (Signed)
  Subjective:    Patient ID: Maria Clarke, female    DOB: [birth year], 79 y.o.   MRN: [id number]  HPI  Maria Clarke is a 79 year old widowed female who comes in today for followup of hypertension  Her blood pressure cuff she's using at home shows her systolic blood pressure be in the 1:30 range. Actually BP is 162 systolic. Her cuff is not functioning properly  Review of Systems    review of systems otherwise negative Objective:   Physical Exam Well-developed well-nourished female no acute distress BP running sitting position 162/60 pulse 70 and regular       Assessment & Plan:  hypertension

## 2012-11-10 ENCOUNTER — Ambulatory Visit (INDEPENDENT_AMBULATORY_CARE_PROVIDER_SITE_OTHER): Payer: Medicare Other | Admitting: Family Medicine

## 2012-11-10 ENCOUNTER — Encounter: Payer: Self-pay | Admitting: Family Medicine

## 2012-11-10 VITALS — BP 120/80 | Temp 98.4°F | Wt 139.0 lb

## 2012-11-10 DIAGNOSIS — H9190 Unspecified hearing loss, unspecified ear: Secondary | ICD-10-CM

## 2012-11-10 DIAGNOSIS — I10 Essential (primary) hypertension: Secondary | ICD-10-CM

## 2012-11-10 DIAGNOSIS — H9193 Unspecified hearing loss, bilateral: Secondary | ICD-10-CM

## 2012-11-10 NOTE — Progress Notes (Signed)
  Subjective:    Patient ID: Maria Clarke, female    DOB: 11/26/1921, 79 y.o.   MRN: [id number]  HPI Mrs. Pickron is a delightful 78 year old widowed female nonsmoker who comes in today for evaluation of hypertension and hearing loss  Her current blood pressure medication is Norvasc 5 mg twice a day, Tenormin 50 mg daily, Lozol 2.5 mg daily, and a potassium supplement 20 mEq daily. Her BP today is 120/80. She has purchased a new machine however she didn't bring it with her  Her daughter-in-law Darel Hong accompanies her today. They're concerned about her hearing has diminished.   Review of Systems    review of systems otherwise negative Objective:   Physical Exam Well-developed well-nourished female no acute distress examination of both ears shows severe cerumen impaction right ear,,,,,,,, flushing caused vertigo  BP right arm sitting position 120/80       Assessment & Plan:  Hypertension with BP too low decrease medication as outlined

## 2012-11-10 NOTE — Patient Instructions (Addendum)
In the morning take one Tenormin, 1 Norvasc, and 1 Lozol  Stop the hydrochlorothiazide, Lipitor, and potassium supplement.  Check your blood pressure daily in the morning   Return in 2 weeks asoutlined

## 2012-11-17 ENCOUNTER — Ambulatory Visit: Payer: Medicare Other | Admitting: Family Medicine

## 2012-12-17 ENCOUNTER — Ambulatory Visit (INDEPENDENT_AMBULATORY_CARE_PROVIDER_SITE_OTHER): Payer: Medicare Other | Admitting: Family Medicine

## 2012-12-17 ENCOUNTER — Encounter: Payer: Self-pay | Admitting: Family Medicine

## 2012-12-17 VITALS — BP 130/60 | Temp 98.3°F | Wt 140.0 lb

## 2012-12-17 DIAGNOSIS — I10 Essential (primary) hypertension: Secondary | ICD-10-CM | POA: Diagnosis not present

## 2012-12-17 NOTE — Patient Instructions (Signed)
Norvasc 5 mg,,,,, one tablet daily in the morning  Tenormin 50 mg,,,,,,,, one half tablet daily in the morning  Lozol 2.5 mg daily in the morning  BP check daily followup in 3 weeks

## 2012-12-17 NOTE — Progress Notes (Signed)
  Subjective:    Patient ID: Maria Clarke, female    DOB: April 26, 1922, 79 y.o.   MRN: [id number]  HPI Maria Clarke is a 79 year old female who comes in today for followup of hypertension  We have been trying to adjust her medications because she's having episodes of her blood pressure drops too low. She says this occurs 2-3 times a day. She's careful when she gets up at night because she sometimes gets lightheaded in the middle the night when she stands up to urinate   Review of Systems Review of systems negative    Objective:   Physical Exam  Well-developed and nourished female no acute distress vital signs stable BP 130/60 pulse 60      Assessment & Plan:  Hypertension,,,,,,,,

## 2012-12-25 ENCOUNTER — Other Ambulatory Visit: Payer: Self-pay | Admitting: Family Medicine

## 2013-02-16 ENCOUNTER — Other Ambulatory Visit: Payer: Self-pay | Admitting: Family Medicine

## 2013-03-02 DIAGNOSIS — Z961 Presence of intraocular lens: Secondary | ICD-10-CM | POA: Diagnosis not present

## 2013-03-05 ENCOUNTER — Encounter (HOSPITAL_COMMUNITY): Payer: Self-pay | Admitting: *Deleted

## 2013-03-05 ENCOUNTER — Observation Stay (HOSPITAL_COMMUNITY)
Admission: EM | Admit: 2013-03-05 | Discharge: 2013-03-07 | Disposition: A | Discharge status: Home / Self Care | Payer: Medicare Other | Attending: Cardiology | Admitting: Cardiology

## 2013-03-05 ENCOUNTER — Encounter: Payer: Self-pay | Admitting: Internal Medicine

## 2013-03-05 ENCOUNTER — Emergency Department (HOSPITAL_COMMUNITY): Payer: Medicare Other

## 2013-03-05 ENCOUNTER — Ambulatory Visit (INDEPENDENT_AMBULATORY_CARE_PROVIDER_SITE_OTHER): Payer: Medicare Other | Admitting: Internal Medicine

## 2013-03-05 VITALS — BP 120/54 | HR 88 | Temp 98.2°F | Resp 18 | Ht 61.5 in | Wt 137.0 lb

## 2013-03-05 DIAGNOSIS — Z79899 Other long term (current) drug therapy: Secondary | ICD-10-CM | POA: Diagnosis not present

## 2013-03-05 DIAGNOSIS — I480 Paroxysmal atrial fibrillation: Secondary | ICD-10-CM

## 2013-03-05 DIAGNOSIS — E876 Hypokalemia: Secondary | ICD-10-CM | POA: Insufficient documentation

## 2013-03-05 DIAGNOSIS — R079 Chest pain, unspecified: Secondary | ICD-10-CM

## 2013-03-05 DIAGNOSIS — F3289 Other specified depressive episodes: Secondary | ICD-10-CM | POA: Insufficient documentation

## 2013-03-05 DIAGNOSIS — I4891 Unspecified atrial fibrillation: Secondary | ICD-10-CM | POA: Diagnosis not present

## 2013-03-05 DIAGNOSIS — E7800 Pure hypercholesterolemia, unspecified: Secondary | ICD-10-CM | POA: Insufficient documentation

## 2013-03-05 DIAGNOSIS — I471 Supraventricular tachycardia, unspecified: Principal | ICD-10-CM | POA: Insufficient documentation

## 2013-03-05 DIAGNOSIS — F329 Major depressive disorder, single episode, unspecified: Secondary | ICD-10-CM | POA: Insufficient documentation

## 2013-03-05 DIAGNOSIS — E039 Hypothyroidism, unspecified: Secondary | ICD-10-CM

## 2013-03-05 DIAGNOSIS — J984 Other disorders of lung: Secondary | ICD-10-CM | POA: Diagnosis not present

## 2013-03-05 DIAGNOSIS — I10 Essential (primary) hypertension: Secondary | ICD-10-CM

## 2013-03-05 DIAGNOSIS — R0989 Other specified symptoms and signs involving the circulatory and respiratory systems: Secondary | ICD-10-CM

## 2013-03-05 DIAGNOSIS — E079 Disorder of thyroid, unspecified: Secondary | ICD-10-CM | POA: Diagnosis not present

## 2013-03-05 DIAGNOSIS — I499 Cardiac arrhythmia, unspecified: Secondary | ICD-10-CM

## 2013-03-05 DIAGNOSIS — R0789 Other chest pain: Secondary | ICD-10-CM | POA: Diagnosis not present

## 2013-03-05 DIAGNOSIS — I6529 Occlusion and stenosis of unspecified carotid artery: Secondary | ICD-10-CM | POA: Diagnosis not present

## 2013-03-05 DIAGNOSIS — T502X5A Adverse effect of carbonic-anhydrase inhibitors, benzothiadiazides and other diuretics, initial encounter: Secondary | ICD-10-CM | POA: Insufficient documentation

## 2013-03-05 HISTORY — DX: Essential (primary) hypertension: I10

## 2013-03-05 HISTORY — DX: Depression, unspecified: F32.A

## 2013-03-05 HISTORY — DX: Major depressive disorder, single episode, unspecified: F32.9

## 2013-03-05 HISTORY — DX: Disorder of arteries and arterioles, unspecified: I77.9

## 2013-03-05 HISTORY — DX: Peripheral vascular disease, unspecified: I73.9

## 2013-03-05 HISTORY — DX: Supraventricular tachycardia: I47.1

## 2013-03-05 HISTORY — DX: Other supraventricular tachycardia: I47.19

## 2013-03-05 HISTORY — DX: Hypothyroidism, unspecified: E03.9

## 2013-03-05 HISTORY — DX: Pure hypercholesterolemia, unspecified: E78.00

## 2013-03-05 LAB — PROTIME-INR
INR: 0.96 (ref 0.00–1.49)
Prothrombin Time: 12.6 seconds (ref 11.6–15.2)

## 2013-03-05 LAB — COMPREHENSIVE METABOLIC PANEL
ALT: 9 U/L (ref 0–35)
AST: 19 U/L (ref 0–37)
Calcium: 10.7 mg/dL — ABNORMAL HIGH (ref 8.4–10.5)
Creatinine, Ser: 0.97 mg/dL (ref 0.50–1.10)
Sodium: 140 mEq/L (ref 135–145)
Total Protein: 7.3 g/dL (ref 6.0–8.3)

## 2013-03-05 LAB — CBC
MCH: 30.8 pg (ref 26.0–34.0)
MCHC: 35.9 g/dL (ref 30.0–36.0)
Platelets: 152 10*3/uL (ref 150–400)
RBC: 5 MIL/uL (ref 3.87–5.11)
RDW: 13.2 % (ref 11.5–15.5)

## 2013-03-05 LAB — APTT: aPTT: 28 seconds (ref 24–37)

## 2013-03-05 LAB — MAGNESIUM: Magnesium: 1.8 mg/dL (ref 1.5–2.5)

## 2013-03-05 LAB — POCT I-STAT TROPONIN I

## 2013-03-05 MED ORDER — ESCITALOPRAM OXALATE 5 MG PO TABS
5.0000 mg | ORAL_TABLET | Freq: Every day | ORAL | Status: DC
  Filled 2013-03-05: qty 1

## 2013-03-05 MED ORDER — LEVOTHYROXINE SODIUM 125 MCG PO TABS
125.0000 ug | ORAL_TABLET | Freq: Every day | ORAL | Status: DC
  Administered 2013-03-06: 125 ug via ORAL
  Filled 2013-03-05 (×2): qty 1

## 2013-03-05 MED ORDER — METOPROLOL TARTRATE 25 MG PO TABS
25.0000 mg | ORAL_TABLET | Freq: Four times a day (QID) | ORAL | Status: AC
  Administered 2013-03-05 – 2013-03-07 (×5): 25 mg via ORAL
  Filled 2013-03-05 (×7): qty 1

## 2013-03-05 MED ORDER — ACETAMINOPHEN 325 MG PO TABS: 650.0000 mg | ORAL_TABLET | ORAL | Status: DC | PRN

## 2013-03-05 MED ORDER — ESCITALOPRAM OXALATE 5 MG PO TABS
5.0000 mg | ORAL_TABLET | Freq: Every day | ORAL | Status: DC
  Administered 2013-03-06 – 2013-03-07 (×2): 5 mg via ORAL
  Filled 2013-03-05 (×2): qty 1

## 2013-03-05 MED ORDER — AMLODIPINE BESYLATE 5 MG PO TABS
5.0000 mg | ORAL_TABLET | Freq: Every day | ORAL | Status: DC
  Administered 2013-03-06 – 2013-03-07 (×2): 5 mg via ORAL
  Filled 2013-03-05 (×3): qty 1

## 2013-03-05 MED ORDER — ONDANSETRON HCL 4 MG/2ML IJ SOLN: 4.0000 mg | Freq: Four times a day (QID) | INTRAMUSCULAR | Status: DC | PRN

## 2013-03-05 MED ORDER — ENOXAPARIN SODIUM 40 MG/0.4ML ~~LOC~~ SOLN
40.0000 mg | SUBCUTANEOUS | Status: DC
  Administered 2013-03-05 – 2013-03-06 (×2): 40 mg via SUBCUTANEOUS
  Filled 2013-03-05 (×3): qty 0.4

## 2013-03-05 MED ORDER — NITROGLYCERIN 0.4 MG SL SUBL: 0.4000 mg | SUBLINGUAL_TABLET | SUBLINGUAL | Status: DC | PRN

## 2013-03-05 MED ORDER — ATORVASTATIN CALCIUM 20 MG PO TABS
20.0000 mg | ORAL_TABLET | Freq: Every day | ORAL | Status: DC
  Administered 2013-03-05 – 2013-03-07 (×3): 20 mg via ORAL
  Filled 2013-03-05 (×3): qty 1

## 2013-03-05 MED ORDER — ALPRAZOLAM 0.25 MG PO TABS: 0.2500 mg | ORAL_TABLET | Freq: Two times a day (BID) | ORAL | Status: DC | PRN

## 2013-03-05 MED ORDER — POTASSIUM CHLORIDE CRYS ER 20 MEQ PO TBCR
40.0000 meq | EXTENDED_RELEASE_TABLET | Freq: Once | ORAL | Status: AC
  Administered 2013-03-05: 40 meq via ORAL
  Filled 2013-03-05: qty 2

## 2013-03-05 MED ORDER — ZOLPIDEM TARTRATE 5 MG PO TABS
5.0000 mg | ORAL_TABLET | Freq: Every evening | ORAL | Status: DC | PRN
  Administered 2013-03-05 – 2013-03-06 (×2): 5 mg via ORAL
  Filled 2013-03-05 (×2): qty 1

## 2013-03-05 MED ORDER — POTASSIUM CHLORIDE 10 MEQ/100ML IV SOLN
10.0000 meq | INTRAVENOUS | Status: AC
  Administered 2013-03-05 (×2): 10 meq via INTRAVENOUS
  Filled 2013-03-05 (×2): qty 100

## 2013-03-05 NOTE — Patient Instructions (Addendum)
Proceed to [facility] emergency room as directed.

## 2013-03-05 NOTE — ED Notes (Addendum)
PT awoke this am feeling diaphoretic and light headed.  She went to see her pcp who found she was in afib.  Per family, pt was accidentally doubling up on a medication, she is not sure which.  Report was given to Dr Freida Busman.

## 2013-03-05 NOTE — Assessment & Plan Note (Signed)
Maintain same dose of Lexapro 5 mg.

## 2013-03-05 NOTE — H&P (Signed)
History and Physical   Patient ID: Maria Clarke MRN: [id number], DOB/AGE: 08/31/1921 79 y.o. Date of Encounter: 03/05/2013  Primary Physician: Maria Georges, MD Primary Cardiologist: Maria Clarke  Chief Complaint:  Rapid atrial fib  HPI: Maria Clarke is a 79 y.o. female with no history of CAD. She has a history of HTN and checks her BP/HR regularly because of this. She has noted her HR to be elevated at times. Her BP has remained good. She has noticed some chest discomfort, not clearly associated with exertion. Her son described palpitations and feeling her heart "racing" but she denies this. She has had a light-headed feeling but has not had any syncope. She went to her family MD to be evaluated for the palpitations and was in atrial fibrillation, rapid ventricular response. She was sent to the ER. Currently, in the ER, she is in SR. She has no bleeding history. She is active around the house, doing her housework and still works part-time. She had mixed up her BP pills, taking extra Maria Clarke and missing the Maria Clarke. However, she has been compliant with other home meds, including Atenolol 25 mg daily.   Past Medical History  Diagnosis Date  . Thyroid disease   . Depression   . Carotid bruit   . Maria Clarke   . Hypercholesteremia     Surgical History:  Past Surgical History  Procedure Laterality Date  . Breast surgery    . Cesarean section       I have reviewed the patient's current medications. Prior to Admission medications   Medication Sig Start Date End Date Taking? Authorizing Provider  amLODipine (Maria Clarke) 5 MG tablet Take 5 mg by mouth daily.   Yes Historical Provider, MD  atenolol (Maria Clarke) 50 MG tablet Take 0.5 tablets (25 mg total) by mouth daily. 03/05/13  Yes Maria R Maria Pais, DO  atorvastatin (Maria Clarke) 20 MG tablet Take 1 tablet (20 mg total) by mouth daily. 06/20/11  Yes Maria Pee, MD  cyanocobalamin (,VITAMIN B-12,) 1000 MCG/ML injection Inject 1 mL (1,000 mcg  total) into the skin once. 03/18/12  Yes Maria Pee, MD  escitalopram (Maria Clarke) 10 MG tablet Take 0.5 tablets (5 mg total) by mouth daily. 03/05/13  Yes Maria R Maria Pais, DO  indapamide (Maria Clarke) 2.5 MG tablet Take 2.5 mg by mouth every morning.   Yes Historical Provider, MD  Maria Clarke (Maria Clarke, LEVOTHROID) 125 MCG tablet Take 125 mcg by mouth daily before breakfast.   Yes Historical Provider, MD  SYRINGE-NEEDLE, DISP, 3 ML 25G X 5/8" 3 ML MISC 1 each by Does not apply route every 30 (thirty) days. 03/18/12   Maria Pee, MD   Scheduled Meds:  Continuous Infusions: . potassium chloride 10 mEq (03/05/13 1706)   PRN Meds:.  Allergies:  Allergies  Allergen Reactions  . Sulfonamide Derivatives     REACTION: ORAL S.E.    History   Social History  . Marital Status: Single    Spouse Name: N/A    Number of Children: N/A  . Years of Education: N/A   Occupational History  . Works part-time in [employer]    Social History Main Topics  . Smoking status: Never Smoker   . Smokeless tobacco: Not on file  . Alcohol Use: No  . Drug Use: No  . Sexual Activity: Not on file   Other Topics Concern  . Not on file   Social History Narrative   Independent, family helps in her care.    No family  history on file. Family Status  Relation Status Death Age  . Mother Deceased   . Father Deceased     Review of Systems:   Full 14-point review of systems otherwise negative except as noted above.  Physical Exam: Blood pressure 174/75, pulse 109, temperature 97.9 F (36.6 C), temperature source Oral, resp. rate 16, height 5\' 3"  (1.6 m), weight 136 lb 12.8 oz (62.052 kg), SpO2 95.00%. General: Well developed, well nourished,female in no acute distress. Head: Normocephalic, atraumatic, sclera non-icteric, no xanthomas, nares are without discharge. Dentition: good Neck: Right carotid bruit. JVD not elevated. No thyromegaly.  Lungs: Good expansion bilaterally. without wheezes or rhonchi.    Heart: IRRegular rate and rhythm with S1 S2 on admission, now regular.  No S3 or S4.  No murmur, no rubs, or gallops appreciated. Abdomen: Soft, non-tender, non-distended with normoactive bowel sounds. No hepatomegaly. No rebound/guarding. No obvious abdominal masses. Msk:  Strength and tone appear normal for age. No joint deformities or effusions, no spine or costo-vertebral angle tenderness. Extremities: No clubbing or cyanosis. No edema.  Distal pedal pulses are 2+ in 4 extrem Neuro: Alert and oriented X 3. Moves all extremities spontaneously. No focal deficits noted. Psych:  Responds to questions appropriately with a normal affect. Skin: No rashes or lesions noted  Labs:   Lab Results  Component Value Date   WBC 8.1 03/05/2013   HGB 15.4* 03/05/2013   HCT 42.9 03/05/2013   MCV 85.8 03/05/2013   PLT 152 03/05/2013    Recent Labs  03/05/13 1512  INR 0.96     Recent Labs Lab 03/05/13 1422  NA 140  K 2.9*  CL 101  CO2 27  BUN 18  CREATININE 0.97  CALCIUM 10.7*  PROT 7.3  BILITOT 0.4  ALKPHOS 50  ALT 9  AST 19  GLUCOSE 139*    Recent Labs  03/05/13 1435  TROPIPOC 0.05    Radiology/Studies: Dg Chest 2 View 03/05/2013   *RADIOLOGY REPORT*  Clinical Data: Atrial fibrillation  CHEST - 2 VIEW  Comparison: 08/09/2008  Findings: The cardiac shadow is stable.  Some scarring is noted in the left lung base.  The lungs are clear bilaterally.  No acute bony abnormality is seen.  IMPRESSION: No acute abnormality noted.   Original Report Authenticated By: Maria Clarke, M.D.   ECG: Underlying SR, with bursts of tachycardia, atrial fib vs atrial tachycardia. Currently SR  ASSESSMENT AND PLAN:  79 yo female with no previous cardiac issues HR documented as high as 166 on home BP machine per son. He noticed extremely variable heart rates this am and called her MD. Initial ECG shows arrhythmia, now in SR.      Principal Problem:   PAF (paroxysmal atrial fibrillation) - She is  currently in SR but is significantly hypertensive with SBP 180s, baseline HR in the 80s. We will change her atenolol to metoprolol 25 mg q 6 hr and see how tolerated. Can change to 50 mg BID in am if tolerated well. Will ck TSH, echo and cycle cardiac enzymes.  Event monitor as OP.   Active Problems:   HYPOTHYROIDISM - ck TSH, continue home Rx    HYPERLIPIDEMIA - ck profile, restart med.    Maria Clarke - increase BB, will need to hold diuretic until K+ improved, continue amlodipine, follow.    Chest pain - active around the house with no ischemic symptoms, cycle enzymes, ck echo. MD advise on ischemic eval if enzymes remain negative.  Pt may  have changed her story between providers and has made recent medication errors. Will have to emphasize the importance of compliance with meds and other plans. Make sure family aware so they can help.    Melida Quitter, PA-C 03/05/2013 5:22 PM Beeper [phone]  Patient seen and examined and history reviewed. Agree with above findings and plan. Pleasant 79 yo WF with history of HTN and hypothyroidism was seen by Dr. Artist Clarke today and referred to ED for evaluation of arrhythmia. She really denies any palpitations or lightheadedness. She does have some mild midsternal chest pain at times. Her exam reveals a right carotid bruit. Lungs are clear. CV exam is without gallop or murmur. Ecg from Dr. Olegario Messier office shows NSR with frequent bursts of atrial tachycardia 4-6 beats. RBBB. Repeat Ecg in ED shows sinus tachycardia. No arrhythmia noted on monitor here. Patient is significantly hypokalemic probably due to diuretic therapy. My impression is that she has PAT exacerbated by hypokalemia and poorly controlled HTN. It is possible she may be having paroxysmal Afib but need to document. Will replete K+. Switch atenolol to metoprolol for BP, HR control. Continue amlodipine. Hold diuretic. Will check Echo and carotid dopplers. Check TSH. If no further arrhythmia in  hospital may want to consider an event monitor as outpatient to rule out intermittent Afib.  Theron Arista [facility] 03/05/2013 5:46 PM

## 2013-03-05 NOTE — ED Notes (Signed)
Walked pt to the bathroom with no difficulties.

## 2013-03-05 NOTE — Assessment & Plan Note (Signed)
79 year old white female with probable new onset A. Fib.  Cycle cardiac enzymes.  She will likely require additional rate controlling agents such as diltiazem.  Defer start of anticoagulation to cardiology.   Check 2 D echo. Check electrolytes, TFTs.

## 2013-03-05 NOTE — ED Notes (Signed)
Dr. Yao at the bedside. 

## 2013-03-05 NOTE — Progress Notes (Signed)
Subjective:    Patient ID: Maria Clarke, female    DOB: Apr 28, 1922, 79 y.o.   MRN: [id number]  HPI 79 year old white female with history of hypertension, hyperlipidemia and hypothyroidism presents with mild chest tightness and "stuffy feeling" in her head for last 3 days. She is accompanied by her son and daughter-in-law. They report she has been tachycardic since waking up this morning.   Patient also woke up feeling diaphoretic. She denies any history of coronary disease. She has never been a smoker.  2 months ago, her primary care doctor-Dr. Tawanna Cooler decreased her Tenormin to 25 mg once daily. She usually takes amlodipine 5 mg and indapamide 2.5 mg. Patient reports making a mistake in taking her medication. She took extra dose of indapamide for last 3 days and skipped taking Norvasc.  She has been on Lexapro for adjustment disorder with mild depressive symptoms for many years. Her decreased her dose to 5 mg within last 6 months.  At previous visit with Dr. Tawanna Cooler she complained of knee and ankle pain. She has been taking Motrin 2 tablets twice a day for the last 2 months.  Review of Systems Mild chest tightness, mild "heavy / stuffy" sensation of the head.  Negative for melena or tarry looking stools. Negative for history of bleeding disorder.    No past medical history on file.  History   Social History  . Marital Status: Single    Spouse Name: N/A    Number of Children: N/A  . Years of Education: N/A   Occupational History  . Not on file.   Social History Main Topics  . Smoking status: Never Smoker   . Smokeless tobacco: Not on file  . Alcohol Use:   . Drug Use:   . Sexual Activity:    Other Topics Concern  . Not on file   Social History Narrative  . No narrative on file    No past surgical history on file.  No family history on file.  Allergies  Allergen Reactions  . Sulfonamide Derivatives     REACTION: ORAL S.E.    Current Outpatient Prescriptions on File  Prior to Visit  Medication Sig Dispense Refill  . amLODipine (NORVASC) 5 MG tablet TAKE 1 TABLET BY MOUTH TWO TIMES A DAY  200 tablet  3  . atorvastatin (LIPITOR) 20 MG tablet Take 1 tablet (20 mg total) by mouth daily.  100 tablet  3  . cyanocobalamin (,VITAMIN B-12,) 1000 MCG/ML injection Inject 1 mL (1,000 mcg total) into the skin once.  30 mL  1  . escitalopram (LEXAPRO) 10 MG tablet Take 1 tablet (10 mg total) by mouth daily.  100 tablet  3  . indapamide (LOZOL) 2.5 MG tablet TAKE 1 TABLET BY MOUTH EVERY DAY  100 tablet  2  . levothyroxine (SYNTHROID, LEVOTHROID) 125 MCG tablet TAKE 1 TABLET (125 MCG TOTAL) BY MOUTH DAILY.  100 tablet  3  . LEXAPRO 10 MG tablet TAKE 1/2 TABLET BY MOUTH EVERY MORNING  90 tablet  2  . SYRINGE-NEEDLE, DISP, 3 ML 25G X 5/8" 3 ML MISC 1 each by Does not apply route every 30 (thirty) days.  50 each  1   No current facility-administered medications on file prior to visit.    BP 120/54  Pulse 84  Temp(Src) 98.2 F (36.8 C)  Resp 18  Ht 5' 1.5" (1.562 m)  Wt 137 lb (62.143 kg)  BMI 25.47 kg/m2  EKG shows tachycardia at 120 beats per  minute. Possible atrial fibrillation with ectopic ventricular beats. Right bundle branch block  Objective:   Physical Exam  Constitutional: She is oriented to person, place, and time. She appears well-developed and well-nourished. No distress.  HENT:  Head: Normocephalic and atraumatic.  Mouth/Throat: Oropharynx is clear and moist.  Bilateral cerumen impaction  Eyes: Conjunctivae and EOM are normal. Pupils are equal, round, and reactive to light.  Previous iridectomy bilaterally  Neck: Normal range of motion. Neck supple.  Right carotid bruit, faint left carotid bruit  Cardiovascular: Intact distal pulses.   Tachycardic, irregularly irregular  Pulmonary/Chest: Effort normal and breath sounds normal. She has no wheezes.  Abdominal: Bowel sounds are normal. She exhibits no mass. There is no tenderness.  Musculoskeletal:  She exhibits no edema.  Neurological: She is alert and oriented to person, place, and time. She displays normal reflexes. No cranial nerve deficit. She exhibits normal muscle tone.  Skin: Skin is warm and dry.  Psychiatric: She has a normal mood and affect. Her behavior is normal.          Assessment & Plan:

## 2013-03-05 NOTE — ED Notes (Signed)
Per admission orders patient given [place] sandwich. Pt updated on POC. Side rail put down and pt sitting on side of bed with no issues at this time.

## 2013-03-05 NOTE — Assessment & Plan Note (Signed)
Obtain carotid dopplers.  Restart Lipitor.

## 2013-03-05 NOTE — ED Provider Notes (Signed)
CSN: [id number]     Arrival date & time 03/05/13  1407 History   First MD Initiated Contact with Patient 03/05/13 1505     Chief Complaint  Patient presents with  . Atrial Fibrillation   (Consider location/radiation/quality/duration/timing/severity/associated sxs/prior Treatment) The history is provided by the patient.  Maria Clarke is a 79 y.o. female hx of HTN, hypothyroidism here with palpitations. Palpitations since yesterday that is intermittent. In the last two days, she mistakenly taken double her normas instead of lozol. She has been urinating more frequently. Denies chest pain, just some mild chest pressure. Denies shortness of breath or leg swelling. Went to PMD, had new onset afib on EKG so sent for eval.    Past Medical History  Diagnosis Date  . Thyroid disease   . Depression   . Carotid bruit   . Hypertension   . Hypercholesteremia    Past Surgical History  Procedure Laterality Date  . Breast surgery    . Cesarean section     No family history on file. History  Substance Use Topics  . Smoking status: Never Smoker   . Smokeless tobacco: Not on file  . Alcohol Use: No   OB History   Grav Para Term Preterm Abortions TAB SAB Ect Mult Living                 Review of Systems  Cardiovascular: Positive for palpitations.  All other systems reviewed and are negative.    Allergies  Sulfonamide derivatives  Home Medications   Current Outpatient Rx  Name  Route  Sig  Dispense  Refill  . amLODipine (NORVASC) 5 MG tablet   Oral   Take 5 mg by mouth Clarke.         Marland Kitchen atorvastatin (LIPITOR) 20 MG tablet   Oral   Take 1 tablet (20 mg total) by mouth Clarke.   100 tablet   3   . cyanocobalamin (,VITAMIN B-12,) 1000 MCG/ML injection   Subcutaneous   Inject 1 mL (1,000 mcg total) into the skin once.   30 mL   1     Large vial if possible   . escitalopram (LEXAPRO) 10 MG tablet   Oral   Take 0.5 tablets (5 mg total) by mouth Clarke.   100 tablet   3    . indapamide (LOZOL) 2.5 MG tablet   Oral   Take 2.5 mg by mouth every morning.         Marland Kitchen levothyroxine (SYNTHROID, LEVOTHROID) 125 MCG tablet   Oral   Take 125 mcg by mouth Clarke before breakfast.         . SYRINGE-NEEDLE, DISP, 3 ML 25G X 5/8" 3 ML MISC   Does not apply   1 each by Does not apply route every 30 (thirty) days.   50 each   1    BP 174/75  Pulse 109  Temp(Src) 97.9 F (36.6 C) (Oral)  Resp 16  Ht 5\' 3"  (1.6 m)  Wt 136 lb 12.8 oz (62.052 kg)  BMI 24.24 kg/m2  SpO2 95% Physical Exam  Nursing note and vitals reviewed. Constitutional: She is oriented to person, place, and time. She appears well-nourished.  Comfortable   HENT:  Head: Normocephalic.  Mouth/Throat: Oropharynx is clear and moist.  Eyes: Conjunctivae are normal. Pupils are equal, round, and reactive to light.  Neck: Normal range of motion. Neck supple.  Cardiovascular: Normal heart sounds.   Mildly tachy, irregular   Pulmonary/Chest: Effort  normal and breath sounds normal. No respiratory distress. She has no wheezes. She has no rales.  Abdominal: Soft. Bowel sounds are normal. She exhibits no distension. There is no tenderness. There is no rebound and no guarding.  Musculoskeletal: Normal range of motion. She exhibits no edema and no tenderness.  Neurological: She is alert and oriented to person, place, and time.  Skin: Skin is warm and dry.  Psychiatric: She has a normal mood and affect. Her behavior is normal. Judgment and thought content normal.    ED Course  Procedures (including critical care time) Labs Review Labs Reviewed  CBC - Abnormal; Notable for the following:    Hemoglobin 15.4 (*)    All other components within normal limits  COMPREHENSIVE METABOLIC PANEL - Abnormal; Notable for the following:    Potassium 2.9 (*)    Glucose, Bld 139 (*)    Calcium 10.7 (*)    GFR calc non Af Amer 50 (*)    GFR calc Af Amer 58 (*)    All other components within normal limits   PROTIME-INR  POCT I-STAT TROPONIN I   Imaging Review Dg Chest 2 View  03/05/2013   *RADIOLOGY REPORT*  Clinical Data: Atrial fibrillation  CHEST - 2 VIEW  Comparison: 08/09/2008  Findings: The cardiac shadow is stable.  Some scarring is noted in the left lung base.  The lungs are clear bilaterally.  No acute bony abnormality is seen.  IMPRESSION: No acute abnormality noted.   Original Report Authenticated By: Alcide Clever, M.D.    Date: 03/05/2013  Rate: 109  Rhythm: sinus tachycardia  QRS Axis: left  Intervals: normal  ST/T Wave abnormalities: nonspecific ST changes  Conduction Disutrbances:right bundle branch block  Narrative Interpretation:   Old EKG Reviewed: changes noted    MDM   1. Chest pain   2. Carotid bruit   3. PAF (paroxysmal atrial fibrillation)   4. Unspecified essential hypertension   5. Unspecified hypothyroidism    Maria Clarke is a 79 y.o. female here with new onset afib. Initial EKG here showed sinus tach but EKG in the office was afib. Will call [facility] for eval.   6:24 PM Cardiology will admit for monitoring.    Richardean Canal, MD 03/05/13 [phone]

## 2013-03-05 NOTE — Assessment & Plan Note (Signed)
Check TFTs

## 2013-03-06 DIAGNOSIS — R079 Chest pain, unspecified: Secondary | ICD-10-CM | POA: Diagnosis not present

## 2013-03-06 DIAGNOSIS — I499 Cardiac arrhythmia, unspecified: Secondary | ICD-10-CM | POA: Diagnosis not present

## 2013-03-06 DIAGNOSIS — E079 Disorder of thyroid, unspecified: Secondary | ICD-10-CM | POA: Diagnosis not present

## 2013-03-06 DIAGNOSIS — I359 Nonrheumatic aortic valve disorder, unspecified: Secondary | ICD-10-CM | POA: Diagnosis not present

## 2013-03-06 DIAGNOSIS — R0989 Other specified symptoms and signs involving the circulatory and respiratory systems: Secondary | ICD-10-CM | POA: Diagnosis not present

## 2013-03-06 DIAGNOSIS — I471 Supraventricular tachycardia: Secondary | ICD-10-CM | POA: Diagnosis not present

## 2013-03-06 DIAGNOSIS — I10 Essential (primary) hypertension: Secondary | ICD-10-CM | POA: Diagnosis not present

## 2013-03-06 LAB — BASIC METABOLIC PANEL
BUN: 16 mg/dL (ref 6–23)
CO2: 28 mEq/L (ref 19–32)
Chloride: 104 mEq/L (ref 96–112)
GFR calc Af Amer: 85 mL/min — ABNORMAL LOW (ref >90)
Potassium: 3 mEq/L — ABNORMAL LOW (ref 3.5–5.1)

## 2013-03-06 LAB — LIPID PANEL
Cholesterol: 235 mg/dL — ABNORMAL HIGH (ref 0–200)
LDL Cholesterol: 161 mg/dL — ABNORMAL HIGH (ref 0–99)
VLDL: 30 mg/dL (ref 0–40)

## 2013-03-06 LAB — TROPONIN I: Troponin I: <0.3 ng/mL (ref <0.30)

## 2013-03-06 LAB — HEMOGLOBIN A1C: Hgb A1c MFr Bld: 5.8 % — ABNORMAL HIGH (ref <5.7)

## 2013-03-06 MED ORDER — POTASSIUM CHLORIDE CRYS ER 20 MEQ PO TBCR
40.0000 meq | EXTENDED_RELEASE_TABLET | Freq: Two times a day (BID) | ORAL | Status: DC
  Administered 2013-03-06 – 2013-03-07 (×3): 40 meq via ORAL
  Filled 2013-03-06 (×4): qty 2

## 2013-03-06 MED ORDER — LEVOTHYROXINE SODIUM 100 MCG PO TABS
100.0000 ug | ORAL_TABLET | Freq: Every day | ORAL | Status: DC
  Administered 2013-03-07: 100 ug via ORAL
  Filled 2013-03-06 (×2): qty 1

## 2013-03-06 MED ORDER — BISACODYL 5 MG PO TBEC
5.0000 mg | DELAYED_RELEASE_TABLET | Freq: Every day | ORAL | Status: DC | PRN
  Administered 2013-03-06: 5 mg via ORAL
  Filled 2013-03-06: qty 1

## 2013-03-06 MED ORDER — OFF THE BEAT BOOK
Freq: Once | Status: AC
  Administered 2013-03-06: 08:00:00
  Filled 2013-03-06: qty 1

## 2013-03-06 NOTE — Progress Notes (Signed)
Patient's potassium is 3.0 this AM after supplementation with K-dur PO and KCl 75meq/100ml IV x 2 runs yesterday in ED. Paged NP, Ward Givens, on-call for Dr. [place] to make aware.  Handoff given to Alda Lea, RN.

## 2013-03-06 NOTE — Progress Notes (Signed)
  Echocardiogram 2D Echocardiogram has been performed.  Maria Clarke 03/06/2013, 3:35 PM

## 2013-03-06 NOTE — Progress Notes (Signed)
Patient ID: Maria Clarke, female   DOB: 08-Dec-1921, 79 y.o.   MRN: [id number]    Subjective:  Denies SSCP, palpitations or Dyspnea   Objective:  Filed Vitals:   03/06/13 0445 03/06/13 0447 03/06/13 0449 03/06/13 0452  BP: 158/82 128/75 131/63 137/73  Pulse: 71     Temp: 97.5 F (36.4 C)     TempSrc: Oral     Resp: 18     Height:      Weight: 140 lb 14.4 oz (63.912 kg)     SpO2: 96%       Intake/Output from previous day: No intake or output data in the 24 hours ending 03/06/13 4098  Physical Exam: Affect appropriate Healthy:  appears stated age HEENT: normal Neck supple with no adenopathy JVP normal right carotid bruits no thyromegaly Lungs clear with no wheezing and good diaphragmatic motion Heart:  S1/S2 no murmur, no rub, gallop or click PMI normal Abdomen: benighn, BS positve, no tenderness, no AAA no bruit.  No HSM or HJR Distal pulses intact with no bruits No edema Neuro non-focal Skin warm and dry No muscular weakness   Lab Results: Basic Metabolic Panel:  Recent Labs  11/91/47 1422 03/05/13 2011 03/06/13 0145  NA 140  --  140  K 2.9*  --  3.0*  CL 101  --  104  CO2 27  --  28  GLUCOSE 139*  --  105*  BUN 18  --  16  CREATININE 0.97  --  0.74  CALCIUM 10.7*  --  9.7  MG  --  1.8  --    Liver Function Tests:  Recent Labs  03/05/13 1422  AST 19  ALT 9  ALKPHOS 50  BILITOT 0.4  PROT 7.3  ALBUMIN 3.8   CBC:  Recent Labs  03/05/13 1422  WBC 8.1  HGB 15.4*  HCT 42.9  MCV 85.8  PLT 152   Cardiac Enzymes:  Recent Labs  03/05/13 2012 03/06/13 0200  TROPONINI <0.30 <0.30     Recent Labs  03/05/13 2011  HGBA1C 5.8*   Fasting Lipid Panel:  Recent Labs  03/06/13 0145  CHOL 235*  HDL 44  LDLCALC 161*  TRIG 148  CHOLHDL 5.3   Thyroid Function Tests:  Recent Labs  03/05/13 2011  TSH 0.103*    Imaging: Dg Chest 2 View  03/05/2013   *RADIOLOGY REPORT*  Clinical Data: Atrial fibrillation  CHEST - 2 VIEW   Comparison: 08/09/2008  Findings: The cardiac shadow is stable.  Some scarring is noted in the left lung base.  The lungs are clear bilaterally.  No acute bony abnormality is seen.  IMPRESSION: No acute abnormality noted.   Original Report Authenticated By: Alcide Clever, M.D.    Cardiac Studies:  ECG:    Telemetry:  SR sinus tachycardia no arrhythmia or paf  03/06/2013   Echo:   Medications:   . amLODipine  5 mg Oral Daily  . atorvastatin  20 mg Oral Daily  . enoxaparin (LOVENOX) injection  40 mg Subcutaneous Q24H  . escitalopram  5 mg Oral Daily  . levothyroxine  125 mcg Oral QAC breakfast  . metoprolol tartrate  25 mg Oral Q6H  . potassium chloride SA  40 mEq Oral BID       Assessment/Plan:  Tachycardia:  No documeted arrhythmia  Can d/c in am wit lopressor 50 bid Bruit:  Carotid duplex today  ASA  No TIA symptoms HTN:  Non compliance at home  Supple K improved Chol:  Continue statin especially with bruit Thyroid:  TSH suppressed .1  May be related to tachycardia  Followed by Dr Tawanna Cooler check Free T4  Decrease synthroid to 100ug  D/C home in am if BP and telemetry remains stable.    Charlton Haws 03/06/2013, 8:29 AM

## 2013-03-06 NOTE — Progress Notes (Addendum)
VASCULAR LAB PRELIMINARY  PRELIMINARY  PRELIMINARY  PRELIMINARY  Carotid duplex  completed.    Preliminary report:  Right:  60-79% internal carotid artery stenosis.  Left:  1-39% ICA stenosis.  Bilateral:  Vertebral artery flow is antegrade.       Ylonda Storr, RVT 03/06/2013, 3:44 PM

## 2013-03-07 ENCOUNTER — Encounter (HOSPITAL_COMMUNITY): Payer: Self-pay | Admitting: Physician Assistant

## 2013-03-07 DIAGNOSIS — I471 Supraventricular tachycardia: Secondary | ICD-10-CM | POA: Diagnosis not present

## 2013-03-07 DIAGNOSIS — E079 Disorder of thyroid, unspecified: Secondary | ICD-10-CM | POA: Diagnosis not present

## 2013-03-07 DIAGNOSIS — I10 Essential (primary) hypertension: Secondary | ICD-10-CM | POA: Diagnosis not present

## 2013-03-07 DIAGNOSIS — R079 Chest pain, unspecified: Secondary | ICD-10-CM | POA: Diagnosis not present

## 2013-03-07 LAB — BASIC METABOLIC PANEL
CO2: 27 mEq/L (ref 19–32)
Chloride: 105 mEq/L (ref 96–112)
Glucose, Bld: 106 mg/dL — ABNORMAL HIGH (ref 70–99)
Sodium: 140 mEq/L (ref 135–145)

## 2013-03-07 MED ORDER — ATENOLOL 50 MG PO TABS: 25.0000 mg | ORAL_TABLET | Freq: Every day | ORAL | Status: DC

## 2013-03-07 MED ORDER — LEVOTHYROXINE SODIUM 100 MCG PO TABS: 100.0000 ug | ORAL_TABLET | Freq: Every day | ORAL | Status: DC

## 2013-03-07 NOTE — Progress Notes (Signed)
Patient Name: Maria Clarke      SUBJECTIVE admitted with palps and non sustained atrial arrhtythmia in setting of hypokalemia and hypertension;  Blood pressure much better; k normalized  Feels well without sob or chest pain or palpitations  Past Medical History  Diagnosis Date  . Thyroid disease   . Depression   . Carotid bruit   . Hypertension   . Hypercholesteremia     Scheduled Meds:  Scheduled Meds: . amLODipine  5 mg Oral Daily  . atorvastatin  20 mg Oral Daily  . enoxaparin (LOVENOX) injection  40 mg Subcutaneous Q24H  . escitalopram  5 mg Oral Daily  . levothyroxine  100 mcg Oral QAC breakfast  . potassium chloride SA  40 mEq Oral BID   Continuous Infusions:   PHYSICAL EXAM Filed Vitals:   03/06/13 1310 03/06/13 2139 03/07/13 0325 03/07/13 0409  BP: 138/68 118/58  148/53  Pulse: 70 61  73  Temp: 98.3 F (36.8 C) 97.5 F (36.4 C)  98 F (36.7 C)  TempSrc: Oral Oral  Oral  Resp: 16 18  18   Height:      Weight:   139 lb 8 oz (63.277 kg)   SpO2: 98% 96%  94%   Well developed and nourished in no acute distress HENT normal Neck supple with JVP-flat Clear Regular rate and rhythm, 2/6 systolic murmur Abd-soft with active BS No Clubbing cyanosis edema Skin-warm and dry A & Oriented  Grossly normal sensory and motor function  TELEMETRY: Reviewed telemetry pt in nsr    Intake/Output Summary (Last 24 hours) at 03/07/13 0954 Last data filed at 03/06/13 1200  Gross per 24 hour  Intake    360 ml  Output      0 ml  Net    360 ml    LABS: Basic Metabolic Panel:  Recent Labs Lab 03/05/13 1422 03/05/13 2011 03/06/13 0145 03/07/13 0555  NA 140  --  140 140  K 2.9*  --  3.0* 3.8  CL 101  --  104 105  CO2 27  --  28 27  GLUCOSE 139*  --  105* 106*  BUN 18  --  16 17  CREATININE 0.97  --  0.74 0.69  CALCIUM 10.7*  --  9.7 9.4  MG  --  1.8  --   --    Cardiac Enzymes:  Recent Labs  03/05/13 2012 03/06/13 0200 03/06/13 0800  TROPONINI  <0.30 <0.30 <0.30   CBC:  Recent Labs Lab 03/05/13 1422  WBC 8.1  HGB 15.4*  HCT 42.9  MCV 85.8  PLT 152   PROTIME:  Recent Labs  03/05/13 1512  LABPROT 12.6  INR 0.96   Liver Function Tests:  Recent Labs  03/05/13 1422  AST 19  ALT 9  ALKPHOS 50  BILITOT 0.4  PROT 7.3  ALBUMIN 3.8   No results found for this basename: LIPASE, AMYLASE,  in the last 72 hours BNP: BNP (last 3 results) No results found for this basename: PROBNP,  in the last 8760 hours D-Dimer: No results found for this basename: DDIMER,  in the last 72 hours Hemoglobin A1C:  Recent Labs  03/05/13 2011  HGBA1C 5.8*   Fasting Lipid Panel:  Recent Labs  03/06/13 0145  CHOL 235*  HDL 44  LDLCALC 161*  TRIG 148  CHOLHDL 5.3   Thyroid Function Tests:  Recent Labs  03/05/13 2011  TSH 0.103*    ASSESSMENT AND PLAN:  Principal Problem:   PAF (paroxysmal atrial fibrillation) Active Problems:   HYPOTHYROIDISM   HYPERLIPIDEMIA   HYPERTENSION   Chest pain  TSH low and synthroid decreased Home off  Diuretic  Would resume low dose atenolol at 25  followup with PCP in two-3 weeks Cardiology as needed  Signed, Sherryl Manges MD  03/07/2013

## 2013-03-07 NOTE — Discharge Summary (Signed)
Discharge Summary   Patient ID: Maria Clarke MRN: [id number], DOB/AGE: 77-Oct-1923 77 y.o. Admit date: 03/05/2013 D/C date:     03/07/2013  Primary Cardiologist: Saw [place] this admission  Primary Discharge Diagnoses:  1. Paroxysmal atrial tachycardia 2. Hypothyroidism with suppressed TSH this admission 3. Hypokalemia felt due to diuretic 4. HTN 5. HLD 6. Chest pain, ruled out for MI  Secondary Discharge Diagnoses:  1. Carotid artery disease - Carotid duplex showed 60-79% RICA and 1-39% LICA 2. Depression    Hospital Course: Maria Clarke is an 79 y/o F with HTN, HLD but no history of cardiac disease. She checks her BP/HR regularly. She has noted her HR to be elevated at times. Her BP has remained good. She has noticed some chest discomfort, not clearly associated with exertion. Her son described palpitations and feeling her heart "racing" but she denied this. She has had a light-headed feeling but has not had any syncope. She went to her family MD to be evaluated for the palpitations and EKG showed NSR with frequent bursts of atrial tachycardia 4-6 beats. She was sent to the ER. At time of cardiology eval in the ER she was maintaining NSR. Although it was felt possible that she may be having PAF, this tachycardia was deemed atrial tach. The patient is fairly active around the house, doing her housework and still works part-time. She had mixed up her BP pills, taking extra Lozol and missing the Norvasc. However, she has been compliant with other home meds, including Atenolol 25 mg daily.The patient was hypokalemic thus diuretic was discontinued and potassium was repleted. TSH was suppressed at 0.103 which was felt to be contributing to her presentation - synthyroid dose was reduced and she was instructed to f/u PCP for repeat thyroid function tests in 4 weeks. 2D Echo showed normal LV function without RWMA, mild AI. Carotid duplex for bruit showed 60-79% RICA and 1-39% LICA. Troponins remained  negative. Today the patient feels well. Atenolol was changed to metoprolol this admission then later reverted back to atenolol at discharge. Dr. Graciela Husbands has seen and examined the patient today and feels she is stable for discharge. He would like her to follow up with her PCP after discharge, and she may see cardiology back as needed. If symptoms recur could consider event monitoring. She was also instructed to f/u with PCP regarding cholesterol and carotid disease.   Discharge Vitals: Blood pressure 97/65, pulse 65, temperature 98 F (36.7 C), temperature source Oral, resp. rate 18, height 5\' 3"  (1.6 m), weight 139 lb 8 oz (63.277 kg), SpO2 94.00%.  Labs: Lab Results  Component Value Date   WBC 8.1 03/05/2013   HGB 15.4* 03/05/2013   HCT 42.9 03/05/2013   MCV 85.8 03/05/2013   PLT 152 03/05/2013    Recent Labs Lab 03/05/13 1422  03/07/13 0555  NA 140  < > 140  K 2.9*  < > 3.8  CL 101  < > 105  CO2 27  < > 27  BUN 18  < > 17  CREATININE 0.97  < > 0.69  CALCIUM 10.7*  < > 9.4  PROT 7.3  --   --   BILITOT 0.4  --   --   ALKPHOS 50  --   --   ALT 9  --   --   AST 19  --   --   GLUCOSE 139*  < > 106*  < > = values in this interval not displayed.  Recent Labs  03/05/13 2012 03/06/13 0200 03/06/13 0800  TROPONINI <0.30 <0.30 <0.30   Lab Results  Component Value Date   CHOL 235* 03/06/2013   HDL 44 03/06/2013   LDLCALC 161* 03/06/2013   TRIG 148 03/06/2013     Diagnostic Studies/Procedures   Dg Chest 2 View  03/05/2013   *RADIOLOGY REPORT*  Clinical Data: Atrial fibrillation  CHEST - 2 VIEW  Comparison: 08/09/2008  Findings: The cardiac shadow is stable.  Some scarring is noted in the left lung base.  The lungs are clear bilaterally.  No acute bony abnormality is seen.  IMPRESSION: No acute abnormality noted.   Original Report Authenticated By: Alcide Clever, M.D.   Carotid duplex showed 60-79% RICA and 1-39% LICA  2D Echo 03/06/13 - Left ventricle: The cavity size was normal. Wall  thickness was normal. Systolic function was vigorous. The estimated ejection fraction was in the range of 65% to 70%. Wall motion was normal; there were no regional wall motion abnormalities. There was an increased relative contribution of atrial contraction to ventricular filling. - Aortic valve: Mild regurgitation.   Discharge Medications     Medication List    STOP taking these medications       indapamide 2.5 MG tablet  Commonly known as:  LOZOL      TAKE these medications       amLODipine 5 MG tablet  Commonly known as:  NORVASC  Take 5 mg by mouth daily.     atenolol 50 MG tablet  Commonly known as:  TENORMIN  Take 0.5 tablets (25 mg total) by mouth daily.     atorvastatin 20 MG tablet  Commonly known as:  LIPITOR  Take 1 tablet (20 mg total) by mouth daily.     cyanocobalamin 1000 MCG/ML injection  Commonly known as:  (VITAMIN B-12)  Inject 1 mL (1,000 mcg total) into the skin once.     escitalopram 10 MG tablet  Commonly known as:  LEXAPRO  Take 0.5 tablets (5 mg total) by mouth daily.     levothyroxine 100 MCG tablet  Commonly known as:  SYNTHROID, LEVOTHROID  Take 1 tablet (100 mcg total) by mouth daily before breakfast.        Disposition   The patient will be discharged in stable condition to home. Discharge Orders   Future Orders Complete By Expires   Diet - low sodium heart healthy  As directed    Discharge instructions  As directed    Comments:     Follow up with your primary care doctor for the following issues: 1. To follow your carotid artery disease. You may need repeat ultrasound in 6 months to a year. 2. For repeat thyroid testing in 4 weeks and further management of your thyroid medicine. 3. To follow your heart rhythm. You may schedule an appointment with cardiology if needed. 4. To follow your cholesterol.   Increase activity slowly  As directed      Follow-up Information   Follow up with TODD,JEFFREY Freida Busman, MD. (See discharge  instructions section)    Specialty:  Family Medicine   Contact information:   [address] [place] [address] [phone]       Follow up with Peter [place], MD. (Follow up as needed)    Specialty:  Cardiology   Contact information:   [address] [place] [address] [phone]         Duration of Discharge Encounter: Greater than 30  minutes including physician and PA time.  Signed, Kriste Basque Menachem Urbanek PA-C 03/07/2013, 11:09 AM

## 2013-03-10 ENCOUNTER — Ambulatory Visit (INDEPENDENT_AMBULATORY_CARE_PROVIDER_SITE_OTHER): Payer: Medicare Other | Admitting: Family Medicine

## 2013-03-10 ENCOUNTER — Encounter: Payer: Self-pay | Admitting: Family Medicine

## 2013-03-10 VITALS — BP 140/70 | Temp 97.9°F | Wt 137.0 lb

## 2013-03-10 DIAGNOSIS — I10 Essential (primary) hypertension: Secondary | ICD-10-CM | POA: Diagnosis not present

## 2013-03-10 DIAGNOSIS — E039 Hypothyroidism, unspecified: Secondary | ICD-10-CM | POA: Diagnosis not present

## 2013-03-10 DIAGNOSIS — E785 Hyperlipidemia, unspecified: Secondary | ICD-10-CM

## 2013-03-10 DIAGNOSIS — I4891 Unspecified atrial fibrillation: Secondary | ICD-10-CM | POA: Diagnosis not present

## 2013-03-10 DIAGNOSIS — I471 Supraventricular tachycardia: Secondary | ICD-10-CM | POA: Diagnosis not present

## 2013-03-10 DIAGNOSIS — R0989 Other specified symptoms and signs involving the circulatory and respiratory systems: Secondary | ICD-10-CM

## 2013-03-10 NOTE — Patient Instructions (Signed)
Your current medications are amlodipine 5 mg daily, Tenormin 50 mg one half tab daily, Lipitor 20 mg daily, Lexapro 10 mg one half tab daily, thyroid 100 mcg daily, vitamin B 1 cc monthly, baby aspirin one daily  Continue to avoid the diuretic  Followup the first week in October Wednesday  We will also get you set up for consult with the vascular surgeons

## 2013-03-10 NOTE — Progress Notes (Signed)
  Subjective:    Patient ID: Maria Clarke, female    DOB: 1921/08/18, 79 y.o.   MRN: [id number]  HPI Maria Clarke is a 79 year old widowed female nonsmoker who comes in today for followup having been hospitalized last week for about of rapid heart rate  She noticed last Thursday morning her heart was racing. She came to the office. Her EKG initially was interpreted as atrial fibrillation. When she got to the hospital followup EKG showed PAT. They wondered if it was not PAF. However she converted spontaneously to sinus rhythm.  In the hospital her blood pressure was stable on Norvasc 5 mg daily and 25 mg of Tenormin daily. Her Synthroid dose was decreased from 125 mcg to 100 mcg because her TSH level was low.  Also during her hospital stay she was noted to be hypokalemic with a serum potassium of 2.9. Her son Maria Clarke is with her today and thinks that she may have taken 2 of the diuretics and skipped her beta blockers. They have reloaded her pill container and checked and double checked all her medications  Also during her hospital stay showed an echocardiogram which was normal. She had a carotid Doppler because her right carotid bruit. The right carotid is occluded 60-79% left 1-39%. She was advised to take an aspirin tablet daily and followup. I will get her set up to see Maria B.  Today she says she feels well and has no complaints. Her blood pressure today is 140/70 pulse is 70 and regular weight 137.  Review of Systems Review of systems otherwise negative no symptoms from the carotid artery stenosis.    Objective:   Physical Exam  Well-developed well nourished female no acute distress examination neck was normal there is a soft bruit in her right none on the bruit on the left. Lungs are clear to auscultation there is a grade 1-2 murmur of aortic insufficiency and being on her at her she does have a little aortic insufficiency.      Assessment & Plan:  Hypertension at goal continue current  therapy  Episode of rapid heart rate etiology unknown question hypo-ketonemia question excessive quantity of Synthroid in her system,,,,,,,, potassium now has been corrected we will hold the diuretic because she has no peripheral edema. Also get a followup TSH level since her Synthroid has been decreased from 100 2500 mcg daily  Right carotid bruit followup with Maria Clarke B.  Followup TSH here in one month and office visit.  Hyperlipidemia restart Lipitor 20 mg daily

## 2013-03-20 ENCOUNTER — Other Ambulatory Visit: Payer: Self-pay | Admitting: Internal Medicine

## 2013-03-20 ENCOUNTER — Other Ambulatory Visit: Payer: Self-pay | Admitting: *Deleted

## 2013-03-20 DIAGNOSIS — E785 Hyperlipidemia, unspecified: Secondary | ICD-10-CM

## 2013-03-20 DIAGNOSIS — I10 Essential (primary) hypertension: Secondary | ICD-10-CM

## 2013-03-20 MED ORDER — ESCITALOPRAM OXALATE 5 MG PO TABS: 5.0000 mg | ORAL_TABLET | Freq: Every day | ORAL | Status: DC

## 2013-03-20 MED ORDER — PRAVASTATIN SODIUM 20 MG PO TABS: 20.0000 mg | ORAL_TABLET | Freq: Every day | ORAL | Status: DC

## 2013-03-20 MED ORDER — METOPROLOL TARTRATE 25 MG PO TABS: 25.0000 mg | ORAL_TABLET | Freq: Two times a day (BID) | ORAL | Status: DC

## 2013-03-20 NOTE — Assessment & Plan Note (Signed)
Patient experienced leg pains within 3 days of restarting lipitor.  She discontinued medication.  I suggest we try switching to pravastatin 20 mg.  She will wait to start pravastatin until HR and BP issue resolved.

## 2013-03-20 NOTE — Assessment & Plan Note (Signed)
Received call from Loa her son.  Patient BP and HR has been erratic.  2 days ago BP was 170/80 with HR 110.  Patient took extra 1/4 tab of atenolol.  HR and BP improved next day.  Now BP too low and HR too high.  90/60 and HR 110.  I suggest we discontinue atenolol and replace with metoprolol 25 mg bid.  The HR and BP were normal while she was hospitalized for tachycardia and was taking metoprolol.

## 2013-04-01 ENCOUNTER — Other Ambulatory Visit: Payer: Self-pay | Admitting: *Deleted

## 2013-04-01 MED ORDER — LEVOTHYROXINE SODIUM 100 MCG PO TABS: 100.0000 ug | ORAL_TABLET | Freq: Every day | ORAL | Status: DC

## 2013-04-08 ENCOUNTER — Encounter: Payer: Self-pay | Admitting: Family Medicine

## 2013-04-08 ENCOUNTER — Ambulatory Visit (INDEPENDENT_AMBULATORY_CARE_PROVIDER_SITE_OTHER): Payer: Medicare Other | Admitting: Family Medicine

## 2013-04-08 VITALS — BP 160/80 | Temp 98.0°F | Wt 141.0 lb

## 2013-04-08 DIAGNOSIS — I10 Essential (primary) hypertension: Secondary | ICD-10-CM | POA: Diagnosis not present

## 2013-04-08 DIAGNOSIS — Z23 Encounter for immunization: Secondary | ICD-10-CM

## 2013-04-08 DIAGNOSIS — H6123 Impacted cerumen, bilateral: Secondary | ICD-10-CM

## 2013-04-08 DIAGNOSIS — H6120 Impacted cerumen, unspecified ear: Secondary | ICD-10-CM

## 2013-04-08 NOTE — Patient Instructions (Signed)
Increase the metoprolol.......... take 2 tablets in the morning and one tablet at bedtime  Check your blood pressure daily in the morning  Return in 3 weeks for followup  When u  Return  bring a  record of all your blood pressure readings and the device

## 2013-04-08 NOTE — Progress Notes (Signed)
  Subjective:    Patient ID: Maria Clarke, female    DOB: [birth year], 79 y.o.   MRN: [id number]  HPI Maria Clarke is an 79 year old widowed female nonsmoker who comes in today for evaluation of 2 problems  In my absence her beta blocker was increased to 25 mg twice a day because her blood pressure was not at goal. Her BP today right arm sitting position 190/80. Pulse was 80 and regular  She's been using ear wax softening drops because she has bilateral cerumen impactions. She is due to get a hearing aid evaluation at 3:00 today   Review of Systems    review of systems negative Objective:   Physical Exam  Well-developed well-nourished female no acute distress BP right arm sitting position 190/80 pulse was 80 and regular  Bilateral cerumen impactions    Assessment & Plan:  Hypertension not at goal increase Lopressor BP check daily followup in a month  Bilateral cerumen impactions irrigated

## 2013-04-17 ENCOUNTER — Encounter: Payer: Self-pay | Admitting: Surgery

## 2013-04-20 ENCOUNTER — Encounter: Payer: Self-pay | Admitting: Surgery

## 2013-04-20 ENCOUNTER — Ambulatory Visit (INDEPENDENT_AMBULATORY_CARE_PROVIDER_SITE_OTHER): Payer: Medicare Other | Admitting: Surgery

## 2013-04-20 DIAGNOSIS — I6529 Occlusion and stenosis of unspecified carotid artery: Secondary | ICD-10-CM | POA: Diagnosis not present

## 2013-04-20 NOTE — Progress Notes (Signed)
[facility]   Patient name: Maria Clarke MRN: [id number] DOB: [birth year] Sex: female   Referred by: Dr Tawanna Cooler  Reason for referral:  Chief Complaint  Patient presents with  . Carotid    new pt, consult for carotid stenosis, ref by Dr. Kelle Darting    HISTORY OF PRESENT ILLNESS: This is a very pleasant 79 year old female who was referred for evaluation of carotid stenosis.  A carotid bruit was initially detected.  She was sent for an ultrasound.  Her ultrasound identified 60-79% stenosis within the right carotid artery.  The left side was in the 1-39% category.  The patient is asymptomatic.  Specifically she denies numbness or weakness in either extremity.  She denies slurred speech.  She denies amaurosis fugax.  Patient is medically managed for hypertension.  She has tried a statin in the past for hypercholesterolemia, however she had significant leg discomfort and has since discontinued this.  She is a nonsmoker.  She denies any cardiac history.  Past Medical History  Diagnosis Date  . Hypothyroidism   . Depression   . Carotid artery disease     Carotid duplex showed 60-79% RICA and 1-39% LICA  . Hypertension   . Hypercholesteremia   . Atrial tachycardia     a. Noted 02/2013 on EKG.    Past Surgical History  Procedure Laterality Date  . Breast surgery    . Cesarean section    . Eye surgery Bilateral     cataract    History   Social History  . Marital Status: Single    Spouse Name: N/A    Number of Children: N/A  . Years of Education: N/A   Occupational History  . Works part-time in [employer]    Social History Main Topics  . Smoking status: Never Smoker   . Smokeless tobacco: Never Used  . Alcohol Use: No  . Drug Use: No  . Sexual Activity: Not on file   Other Topics Concern  . Not on file   Social History Narrative   Independent, family helps in her care.    Family History  Problem Relation Age of Onset  . Diabetes  Mother     Allergies as of 04/20/2013 - Review Complete 04/20/2013  Allergen Reaction Noted  . Sulfonamide derivatives  02/14/2007    Current Outpatient Prescriptions on File Prior to Visit  Medication Sig Dispense Refill  . amLODipine (NORVASC) 5 MG tablet Take 5 mg by mouth daily.      . cyanocobalamin (,VITAMIN B-12,) 1000 MCG/ML injection Inject 1 mL (1,000 mcg total) into the skin once.  30 mL  1  . escitalopram (LEXAPRO) 5 MG tablet Take 1 tablet (5 mg total) by mouth daily.  90 tablet  0  . levothyroxine (SYNTHROID, LEVOTHROID) 100 MCG tablet Take 1 tablet (100 mcg total) by mouth daily before breakfast.  30 tablet  5  . pravastatin (PRAVACHOL) 20 MG tablet Take 1 tablet (20 mg total) by mouth daily.  90 tablet  0   No current facility-administered medications on file prior to visit.     REVIEW OF SYSTEMS: Cardiovascular: No chest pain, chest pressure, palpitations, orthopnea, or dyspnea on exertion. No claudication or rest pain,  No history of DVT or phlebitis.  Positive for shortness of breath on exertion Pulmonary: No productive cough, asthma or wheezing. Neurologic: No weakness, paresthesias, aphasia, or amaurosis. No dizziness. Hematologic: No bleeding problems or clotting disorders. Musculoskeletal: No joint pain or joint  swelling. Gastrointestinal: No blood in stool or hematemesis Genitourinary: No dysuria or hematuria. Psychiatric:: No history of major depression. Integumentary: No rashes or ulcers. Constitutional: No fever or chills.  PHYSICAL EXAMINATION: General: The patient appears their stated age.  Vital signs are BP 182/70  Pulse 65  Ht 5\' 3"  (1.6 m)  Wt 139 lb 1.6 oz (63.095 kg)  BMI 24.65 kg/m2  SpO2 98% HEENT:  No gross abnormalities Pulmonary: Respirations are non-labored Abdomen: Soft and non-tender aorta is nonpalpable Musculoskeletal: There are no major deformities.   Neurologic: No focal weakness or paresthesias are detected, Skin: There are  no ulcer or rashes noted. Psychiatric: The patient has normal affect. Cardiovascular: There is a regular rate and rhythm without significant murmur appreciated.  Positive right carotid bruit.  Palpable pedal pulses.  Diagnostic Studies: I have reviewed her carotid ultrasound which showed 60-79% right carotid stenosis.  End diastolic velocities on the right were 65 cm/s the left side shows 1-39% stenosis.    Assessment:  Asymptomatic right carotid stenosis Plan: I reviewed the patient's ultrasound with her.  We went over the signs and symptoms of TIA/stroke.  I feel that she is asymptomatic currently.  As long as she remains asymptomatic I would not consider intervention for her carotid stenosis until he gets greater than 80%.  I will have her followup in 6 months for a repeat carotid ultrasound.     Jorge Ny, M.D. [facility] Office: [phone] Pager:  [phone]

## 2013-04-21 NOTE — Addendum Note (Signed)
Addended by: Sharee Pimple on: 04/21/2013 08:37 AM   Modules accepted: Orders

## 2013-04-30 ENCOUNTER — Encounter: Payer: Self-pay | Admitting: Family Medicine

## 2013-04-30 ENCOUNTER — Ambulatory Visit (INDEPENDENT_AMBULATORY_CARE_PROVIDER_SITE_OTHER): Payer: Medicare Other | Admitting: Family Medicine

## 2013-04-30 VITALS — BP 130/68 | Temp 97.9°F | Wt 140.0 lb

## 2013-04-30 DIAGNOSIS — I10 Essential (primary) hypertension: Secondary | ICD-10-CM

## 2013-04-30 NOTE — Patient Instructions (Signed)
Continue to check your blood pressure daily in the morning  Followup in 2 months  And remember to take your medication daily!!!!!!!!!!!!!!!!!!!!!

## 2013-04-30 NOTE — Progress Notes (Signed)
  Subjective:    Patient ID: Maria Clarke, female    DOB: Feb 16, 1922, 79 y.o.   MRN: [id number]  HPI Maria Clarke s an 79 year old widowed female nonsmoker who comes in today for followup of hypertension. We've been adjusting her medication because her blood pressure was too high. BP today was 130/68 we checked her digital cuff and is indeed accurate.   Review of Systems    no side effects to medication on a beta blocker pulse is 80 and regular Objective:   Physical Exam  Well-developed well-nourished female no acute distress BP right arm sitting position 130/68 pulse 80 and regular      Assessment & Plan:  Hypertension at goal continue current therapy followup in 2 months

## 2013-05-20 ENCOUNTER — Other Ambulatory Visit: Payer: Self-pay | Admitting: *Deleted

## 2013-05-20 MED ORDER — METOPROLOL TARTRATE 25 MG PO TABS: ORAL_TABLET | ORAL | Status: DC

## 2013-06-17 ENCOUNTER — Other Ambulatory Visit: Payer: Self-pay

## 2013-06-18 ENCOUNTER — Other Ambulatory Visit: Payer: Self-pay

## 2013-06-18 DIAGNOSIS — E785 Hyperlipidemia, unspecified: Secondary | ICD-10-CM

## 2013-06-18 MED ORDER — PRAVASTATIN SODIUM 20 MG PO TABS: 20.0000 mg | ORAL_TABLET | Freq: Every day | ORAL | Status: DC

## 2013-06-18 MED ORDER — ESCITALOPRAM OXALATE 5 MG PO TABS: 5.0000 mg | ORAL_TABLET | Freq: Every day | ORAL | Status: DC

## 2013-07-06 ENCOUNTER — Ambulatory Visit: Payer: Medicare Other | Admitting: Family Medicine

## 2013-07-13 ENCOUNTER — Ambulatory Visit (INDEPENDENT_AMBULATORY_CARE_PROVIDER_SITE_OTHER): Payer: Medicare Other | Admitting: Family Medicine

## 2013-07-13 ENCOUNTER — Encounter: Payer: Self-pay | Admitting: Family Medicine

## 2013-07-13 VITALS — BP 168/70 | Temp 97.7°F | Wt 141.0 lb

## 2013-07-13 DIAGNOSIS — I10 Essential (primary) hypertension: Secondary | ICD-10-CM | POA: Diagnosis not present

## 2013-07-13 MED ORDER — METOPROLOL TARTRATE 50 MG PO TABS: 50.0000 mg | ORAL_TABLET | Freq: Two times a day (BID) | ORAL | Status: DC

## 2013-07-13 NOTE — Progress Notes (Signed)
   Subjective:    Patient ID: Maria Clarke, female    DOB: [birth year], 80 y.o.   MRN: [id number]  HPI Mrs. Boesen is a 80 year old widowed female nonsmoker who comes in today for followup of hypertension  She takes Norvasc 5 mg daily and Lopressor 50 mg the morning and 25 mg at bedtime. Her diastolics are normal her systolics running in the 093-267 range   Review of Systems    negative review of systems Objective:   Physical Exam  Well-developed well-nourished female no acute distress vital signs stable she's afebrile BP right arm sitting position 180/80 pulse 80 and regular      Assessment & Plan:  Hypertension not at goal increase beta blocker to 50 mg twice a day followup in one month

## 2013-07-13 NOTE — Patient Instructions (Signed)
Lopressor 25 mg,,,,,,,,,, 2 in the morning and 2 at bedtime until all the 25 mg tablets are gone then switch to the 50 mg tablets............ one twice daily  Check your blood pressure daily in the morning  Return in one month for followup

## 2013-07-13 NOTE — Progress Notes (Signed)
Pre visit review using our clinic review tool, if applicable. No additional management support is needed unless otherwise documented below in the visit note. 

## 2013-07-16 ENCOUNTER — Other Ambulatory Visit: Payer: Self-pay | Admitting: *Deleted

## 2013-07-16 MED ORDER — DONEPEZIL HCL 5 MG PO TABS: 5.0000 mg | ORAL_TABLET | Freq: Every day | ORAL | Status: DC

## 2013-08-13 ENCOUNTER — Ambulatory Visit (INDEPENDENT_AMBULATORY_CARE_PROVIDER_SITE_OTHER): Payer: Medicare Other | Admitting: Family Medicine

## 2013-08-13 ENCOUNTER — Encounter: Payer: Self-pay | Admitting: Family Medicine

## 2013-08-13 VITALS — BP 140/80 | Temp 98.0°F | Wt 142.0 lb

## 2013-08-13 DIAGNOSIS — I10 Essential (primary) hypertension: Secondary | ICD-10-CM | POA: Diagnosis not present

## 2013-08-13 DIAGNOSIS — R413 Other amnesia: Secondary | ICD-10-CM | POA: Diagnosis not present

## 2013-08-13 NOTE — Patient Instructions (Signed)
Continue your current medications  Check your blood pressure once weekly since your blood pressures normal  Return the fourth week in August for your annual complete exam  Labs one week prior

## 2013-08-13 NOTE — Progress Notes (Signed)
   Subjective:    Patient ID: Maria Clarke, female    DOB: Nov 02, 1921, 80 y.o.   MRN: [id number]  HPI Maria Clarke is a 80 year old female who comes in today for evaluation of 2 problems  We've been adjusting her blood pressure medication on her current doses of Norvasc 5 mg in the morning, Lopressor 50 twice a day, her BP is 140/80  Her daughter Maria Clarke is monitoring his medications  Maria Clarke does have some short-term memory loss and we started her on Aricept 5 mg daily. The family does not want her to know that she's taking this medication...........Marland Kitchen we labeled it as a vitamin   Review of Systems Review of systems negative    Objective:   Physical Exam Well-developed well nourished female no acute distress vital signs stable she's afebrile BP 140/80       Assessment & Plan:  Hypertension at goal continue current therapy  Short-term memory loss continue Aricept

## 2013-08-14 ENCOUNTER — Telehealth: Payer: Self-pay | Admitting: Family Medicine

## 2013-08-14 NOTE — Telephone Encounter (Signed)
Relevant patient education mailed to patient.  

## 2013-09-02 ENCOUNTER — Telehealth: Payer: Self-pay | Admitting: *Deleted

## 2013-09-02 MED ORDER — AMLODIPINE BESYLATE 5 MG PO TABS: 5.0000 mg | ORAL_TABLET | Freq: Every day | ORAL | Status: DC

## 2013-09-02 NOTE — Telephone Encounter (Signed)
Refill amlodipine 5 mg twice daily

## 2013-09-02 NOTE — Telephone Encounter (Signed)
Rx sent to pharmacy   

## 2013-09-15 ENCOUNTER — Telehealth: Payer: Self-pay | Admitting: Family Medicine

## 2013-09-15 DIAGNOSIS — D649 Anemia, unspecified: Secondary | ICD-10-CM

## 2013-09-15 MED ORDER — CYANOCOBALAMIN 1000 MCG/ML IJ SOLN: 1000.0000 ug | Freq: Once | INTRAMUSCULAR | Status: DC

## 2013-09-15 NOTE — Telephone Encounter (Signed)
Rx sent to pharmacy   

## 2013-09-15 NOTE — Telephone Encounter (Signed)
[place], [address] requesting refill of cyanocobalamin (,VITAMIN B-12,) 1000 MCG/ML injection

## 2013-09-24 ENCOUNTER — Other Ambulatory Visit: Payer: Self-pay | Admitting: *Deleted

## 2013-09-24 MED ORDER — LEVOTHYROXINE SODIUM 100 MCG PO TABS: 100.0000 ug | ORAL_TABLET | Freq: Every day | ORAL | Status: DC

## 2013-10-16 ENCOUNTER — Encounter: Payer: Self-pay | Admitting: Surgery

## 2013-10-19 ENCOUNTER — Encounter: Payer: Self-pay | Admitting: Surgery

## 2013-10-19 ENCOUNTER — Ambulatory Visit (HOSPITAL_COMMUNITY)
Admission: RE | Admit: 2013-10-19 | Discharge: 2013-10-19 | Disposition: A | Discharge status: Home / Self Care | Payer: Medicare Other | Source: Ambulatory Visit | Attending: Surgery | Admitting: Surgery

## 2013-10-19 ENCOUNTER — Ambulatory Visit (INDEPENDENT_AMBULATORY_CARE_PROVIDER_SITE_OTHER): Payer: Medicare Other | Admitting: Surgery

## 2013-10-19 VITALS — BP 134/54 | HR 59 | Ht 63.0 in | Wt 143.0 lb

## 2013-10-19 DIAGNOSIS — I6529 Occlusion and stenosis of unspecified carotid artery: Secondary | ICD-10-CM | POA: Insufficient documentation

## 2013-10-19 DIAGNOSIS — I658 Occlusion and stenosis of other precerebral arteries: Secondary | ICD-10-CM | POA: Insufficient documentation

## 2013-10-19 NOTE — Progress Notes (Signed)
Patient name: Maria Clarke MRN: [id number] DOB: May 28, 1922 Sex: female     Chief Complaint  Patient presents with  . Carotid    6 month f/u     HISTORY OF PRESENT ILLNESS: The patient is here today for followup of her carotid stenosis.  Her carotid disease was initially detected by bruit.  Her last ultrasound showed 60-79% stenosis.  She is here today without complaints.  She denies any new symptoms.  Specifically, she denies numbness or weakness in either extremity.  She denies slurred speech.  She denies amaurosis fugax.  Her only complaint is that of her arthritis in her hands.  Past Medical History  Diagnosis Date  . Hypothyroidism   . Depression   . Carotid artery disease     Carotid duplex showed 29-92% RICA and 4-26% LICA  . Hypertension   . Hypercholesteremia   . Atrial tachycardia     a. Noted 02/2013 on EKG.    Past Surgical History  Procedure Laterality Date  . Breast surgery    . Cesarean section    . Eye surgery Bilateral     cataract    History   Social History  . Marital Status: Single    Spouse Name: N/A    Number of Children: N/A  . Years of Education: N/A   Occupational History  . Works part-time in Engineer, [employer]    Social History Main Topics  . Smoking status: Never Smoker   . Smokeless tobacco: Never Used  . Alcohol Use: No  . Drug Use: No  . Sexual Activity: Not on file   Other Topics Concern  . Not on file   Social History Narrative   Independent, family helps in her care.    Family History  Problem Relation Age of Onset  . Diabetes Mother     Allergies as of 10/19/2013 - Review Complete 10/19/2013  Allergen Reaction Noted  . Sulfonamide derivatives  02/14/2007    Current Outpatient Prescriptions on File Prior to Visit  Medication Sig Dispense Refill  . amLODipine (NORVASC) 5 MG tablet Take 1 tablet (5 mg total) by mouth daily.  90 tablet  3  . cyanocobalamin (,VITAMIN B-12,) 1000 MCG/ML injection Inject 1 mL (1,000 mcg  total) into the skin once.  30 mL  1  . donepezil (ARICEPT) 5 MG tablet Take 1 tablet (5 mg total) by mouth at bedtime.  90 tablet  3  . escitalopram (LEXAPRO) 5 MG tablet Take 1 tablet (5 mg total) by mouth daily.  90 tablet  1  . levothyroxine (SYNTHROID, LEVOTHROID) 100 MCG tablet Take 1 tablet (100 mcg total) by mouth daily before breakfast.  30 tablet  5  . metoprolol (LOPRESSOR) 50 MG tablet Take 1 tablet (50 mg total) by mouth 2 (two) times daily.  180 tablet  3  . metoprolol tartrate (LOPRESSOR) 25 MG tablet two tablets every am and one tablet at night  90 tablet  5  . pravastatin (PRAVACHOL) 20 MG tablet Take 1 tablet (20 mg total) by mouth daily.  90 tablet  1   No current facility-administered medications on file prior to visit.     REVIEW OF SYSTEMS: Please see history of present illness, otherwise all systems are negative  PHYSICAL EXAMINATION:   Vital signs are BP 134/54  Pulse 59  Ht 5\' 3"  (1.6 m)  Wt 143 lb (64.864 kg)  BMI 25.34 kg/m2  SpO2 98% General: The patient appears their stated age.  HEENT:  No gross abnormalities Pulmonary:  Non labored breathing  Musculoskeletal: There are no major deformities. Neurologic: No focal weakness or paresthesias are detected, Skin: There are no ulcer or rashes noted. Psychiatric: The patient has normal affect. Cardiovascular: There is a regular rate and rhythm with systolic ejection murmur.  Positive bilateral carotid bruit.   Diagnostic Studies Duplex ultrasound was ordered and reviewed.  This shows a stable exam.  Ultrasound remained in the 60-79% carotid stenosis on the right and less than 40% left  Assessment: Asymptomatic carotid stenosis, right greater than left Plan: Continue with medical management.  Currently, there is no role for surgical intervention, as the patient remains asymptomatic, and has less than 80% stenosis.  I discussed the signs and symptoms to be aware of.  She will followup in 6 months with a repeat  ultrasound.  Eldridge Abrahams, M.D. [facility] Office: [phone] Pager:  [phone]

## 2013-10-20 NOTE — Addendum Note (Signed)
Addended by: Dorthula Rue L on: 10/20/2013 01:57 PM   Modules accepted: Orders

## 2013-12-16 ENCOUNTER — Telehealth: Payer: Self-pay | Admitting: Family Medicine

## 2013-12-16 MED ORDER — ESCITALOPRAM OXALATE 5 MG PO TABS: 5.0000 mg | ORAL_TABLET | Freq: Every day | ORAL | Status: DC

## 2013-12-16 NOTE — Telephone Encounter (Signed)
[place], [address] is requesting re-fill on escitalopram (LEXAPRO) 5 MG tablet

## 2013-12-21 ENCOUNTER — Telehealth: Payer: Self-pay | Admitting: Family Medicine

## 2013-12-21 MED ORDER — DONEPEZIL HCL 5 MG PO TABS: 5.0000 mg | ORAL_TABLET | Freq: Every day | ORAL | Status: DC

## 2013-12-21 NOTE — Telephone Encounter (Signed)
[place], [address] DR is requesting donepezil (ARICEPT) 5 MG tablet

## 2014-03-01 ENCOUNTER — Encounter: Payer: Medicare Other | Admitting: Family Medicine

## 2014-03-03 ENCOUNTER — Encounter: Payer: Self-pay | Admitting: Family Medicine

## 2014-03-03 ENCOUNTER — Ambulatory Visit (INDEPENDENT_AMBULATORY_CARE_PROVIDER_SITE_OTHER): Payer: Medicare Other | Admitting: Family Medicine

## 2014-03-03 VITALS — BP 148/78 | Temp 98.2°F | Ht 60.75 in | Wt 138.0 lb

## 2014-03-03 DIAGNOSIS — I6529 Occlusion and stenosis of unspecified carotid artery: Secondary | ICD-10-CM

## 2014-03-03 DIAGNOSIS — Z23 Encounter for immunization: Secondary | ICD-10-CM | POA: Diagnosis not present

## 2014-03-03 DIAGNOSIS — R0989 Other specified symptoms and signs involving the circulatory and respiratory systems: Secondary | ICD-10-CM

## 2014-03-03 DIAGNOSIS — F3289 Other specified depressive episodes: Secondary | ICD-10-CM

## 2014-03-03 DIAGNOSIS — E785 Hyperlipidemia, unspecified: Secondary | ICD-10-CM

## 2014-03-03 DIAGNOSIS — F329 Major depressive disorder, single episode, unspecified: Secondary | ICD-10-CM

## 2014-03-03 DIAGNOSIS — E039 Hypothyroidism, unspecified: Secondary | ICD-10-CM | POA: Diagnosis not present

## 2014-03-03 DIAGNOSIS — I10 Essential (primary) hypertension: Secondary | ICD-10-CM | POA: Diagnosis not present

## 2014-03-03 DIAGNOSIS — D649 Anemia, unspecified: Secondary | ICD-10-CM

## 2014-03-03 LAB — POCT URINALYSIS DIPSTICK
BILIRUBIN UA: NEGATIVE
Glucose, UA: NEGATIVE
Ketones, UA: NEGATIVE
Leukocytes, UA: NEGATIVE
Nitrite, UA: NEGATIVE
PH UA: 5
SPEC GRAV UA: 1.02
Urobilinogen, UA: 0.2

## 2014-03-03 LAB — BASIC METABOLIC PANEL
BUN: 16 mg/dL (ref 6–23)
CHLORIDE: 105 meq/L (ref 96–112)
CO2: 27 mEq/L (ref 19–32)
CREATININE: 0.8 mg/dL (ref 0.4–1.2)
Calcium: 9.6 mg/dL (ref 8.4–10.5)
GFR: 73.69 mL/min (ref >60.00)
GLUCOSE: 101 mg/dL — AB (ref 70–99)
POTASSIUM: 4 meq/L (ref 3.5–5.1)
Sodium: 142 mEq/L (ref 135–145)

## 2014-03-03 LAB — TSH: TSH: 0.4 u[IU]/mL (ref 0.35–4.50)

## 2014-03-03 LAB — CBC WITH DIFFERENTIAL/PLATELET
BASOS PCT: 0.3 % (ref 0.0–3.0)
Basophils Absolute: 0 10*3/uL (ref 0.0–0.1)
EOS PCT: 2 % (ref 0.0–5.0)
Eosinophils Absolute: 0.2 10*3/uL (ref 0.0–0.7)
HCT: 41.7 % (ref 36.0–46.0)
HEMOGLOBIN: 13.8 g/dL (ref 12.0–15.0)
LYMPHS ABS: 1.8 10*3/uL (ref 0.7–4.0)
Lymphocytes Relative: 23.8 % (ref 12.0–46.0)
MCHC: 33 g/dL (ref 30.0–36.0)
MCV: 91.4 fl (ref 78.0–100.0)
MONO ABS: 0.5 10*3/uL (ref 0.1–1.0)
Monocytes Relative: 7 % (ref 3.0–12.0)
NEUTROS ABS: 5.1 10*3/uL (ref 1.4–7.7)
Neutrophils Relative %: 66.9 % (ref 43.0–77.0)
PLATELETS: 179 10*3/uL (ref 150.0–400.0)
RBC: 4.57 Mil/uL (ref 3.87–5.11)
RDW: 14.3 % (ref 11.5–15.5)
WBC: 7.6 10*3/uL (ref 4.0–10.5)

## 2014-03-03 LAB — LIPID PANEL
CHOLESTEROL: 258 mg/dL — AB (ref 0–200)
HDL: 54.4 mg/dL (ref >39.00)
LDL Cholesterol: 168 mg/dL — ABNORMAL HIGH (ref 0–99)
NonHDL: 203.6
Total CHOL/HDL Ratio: 5
Triglycerides: 179 mg/dL — ABNORMAL HIGH (ref 0.0–149.0)
VLDL: 35.8 mg/dL (ref 0.0–40.0)

## 2014-03-03 MED ORDER — DONEPEZIL HCL 5 MG PO TABS: 5.0000 mg | ORAL_TABLET | Freq: Every day | ORAL | Status: DC

## 2014-03-03 MED ORDER — AMLODIPINE BESYLATE 5 MG PO TABS: 5.0000 mg | ORAL_TABLET | Freq: Every day | ORAL | Status: DC

## 2014-03-03 MED ORDER — CYANOCOBALAMIN 1000 MCG/ML IJ SOLN
1000.0000 ug | Freq: Once | INTRAMUSCULAR | Status: DC
Start: 2014-03-03 — End: 2014-08-23

## 2014-03-03 MED ORDER — ESCITALOPRAM OXALATE 5 MG PO TABS: 5.0000 mg | ORAL_TABLET | Freq: Every day | ORAL | Status: DC

## 2014-03-03 MED ORDER — LEVOTHYROXINE SODIUM 100 MCG PO TABS: 100.0000 ug | ORAL_TABLET | Freq: Every day | ORAL | Status: DC

## 2014-03-03 MED ORDER — METOPROLOL TARTRATE 50 MG PO TABS: 50.0000 mg | ORAL_TABLET | Freq: Two times a day (BID) | ORAL | Status: DC

## 2014-03-03 MED ORDER — PRAVASTATIN SODIUM 20 MG PO TABS: 20.0000 mg | ORAL_TABLET | Freq: Every day | ORAL | Status: DC

## 2014-03-03 NOTE — Progress Notes (Signed)
   Subjective:    Patient ID: Maria Clarke, female    DOB: [birth year], 80 y.o.   MRN: [id number]  HPI Maria Clarke is a 80 year old widowed female nonsmoker who comes in today for evaluation of hypertension, B12 deficiency, slight early dementia, mild depression, hypothyroidism, and hyperlipidemia, and carotid bruit.  She continues to live alone. Her medications are monitored by her son Maria Clarke and daughter-in-law Maria Clarke. She's doing well and has no complaints. She gets routine eye care, dental care, no longer needs it mammography or colonoscopy.  Vaccinations updated by Maria Clarke feels like she her memory is stable on the Aricept 5 mg daily. She's not seen any deterioration   Review of Systems Review of systems otherwise negative    Objective:   Physical Exam Well-developed well-nourished female no acute distress vital signs stable she's afebrile BP here 140/78 BP at home this morning 135/81  HEENT negative neck was supple no adenopathy thyroid normal cardiopulmonary exam normal bowel exam normal extremities normal skin normal peripheral pulses .......Marland Kitchen except for her carotid arteries. She has a soft bruit on the left and a loud bruit on the right. She's followed in vascular by Dr. Assunta Found   Bilateral breast exam normal pelvic and rectal not indicated       Assessment & Plan:  Healthy female  Hypertension at goal continue current therapy  History of B12 deficiency continue B12  Mild dementia continue Aricept 5 mg daily  Mild depression continue Lexapro 5 mg daily  Hypothyroidism continue Synthroid 100 mcg daily  Hyperlipidemia and carotid bruit......... Pravachol 20 mg daily

## 2014-03-03 NOTE — Patient Instructions (Signed)
Baby aspirin...........Marland Kitchen 1 Monday Wednesday Friday  Continue other medications  Blood pressure check once weekly in the morning  Followup in 3 months sooner if any problems.

## 2014-03-03 NOTE — Progress Notes (Signed)
Pre visit review using our clinic review tool, if applicable. No additional management support is needed unless otherwise documented below in the visit note. 

## 2014-03-10 DIAGNOSIS — H43819 Vitreous degeneration, unspecified eye: Secondary | ICD-10-CM | POA: Diagnosis not present

## 2014-03-10 DIAGNOSIS — H26499 Other secondary cataract, unspecified eye: Secondary | ICD-10-CM | POA: Diagnosis not present

## 2014-03-10 DIAGNOSIS — Z961 Presence of intraocular lens: Secondary | ICD-10-CM | POA: Diagnosis not present

## 2014-03-30 ENCOUNTER — Telehealth: Payer: Self-pay | Admitting: Family Medicine

## 2014-03-30 NOTE — Telephone Encounter (Signed)
Noted in chart.

## 2014-03-30 NOTE — Telephone Encounter (Signed)
Pt's daughter in law states pt is no longer taking pravastatin (PRAVACHOL) 20 MG tablet. They would like her med list updated.

## 2014-04-19 ENCOUNTER — Encounter: Payer: Self-pay | Admitting: Family

## 2014-04-20 ENCOUNTER — Ambulatory Visit (INDEPENDENT_AMBULATORY_CARE_PROVIDER_SITE_OTHER): Payer: Medicare Other | Admitting: Family

## 2014-04-20 ENCOUNTER — Ambulatory Visit (HOSPITAL_COMMUNITY)
Admission: RE | Admit: 2014-04-20 | Discharge: 2014-04-20 | Disposition: A | Discharge status: Home / Self Care | Payer: Medicare Other | Source: Ambulatory Visit | Attending: Family | Admitting: Family

## 2014-04-20 ENCOUNTER — Encounter: Payer: Self-pay | Admitting: Family

## 2014-04-20 VITALS — BP 130/63 | HR 58 | Resp 14 | Ht 62.0 in | Wt 140.0 lb

## 2014-04-20 DIAGNOSIS — I6529 Occlusion and stenosis of unspecified carotid artery: Secondary | ICD-10-CM | POA: Insufficient documentation

## 2014-04-20 DIAGNOSIS — I6523 Occlusion and stenosis of bilateral carotid arteries: Secondary | ICD-10-CM

## 2014-04-20 NOTE — Progress Notes (Signed)
Established Carotid Patient   History of Present Illness  Maria Clarke is a 80 y.o. female patient of Dr. Trula Slade who is here today for followup of her carotid stenosis. Her carotid disease was initially detected by bruit.   Patient has not had previous carotid artery intervention. Patient reports that she has never had a stroke or TIA, specifically she denies any history of amaurosis fugax or monocular blindness, denies a history of unilateral facial drooping, denies a history of hemiplegia, denies a history of receptive or expressive aphasia.   Patient denies claudication symptoms with walking.  Pt  denies New Medical or Surgical History.  Pt Diabetic: No Pt smoker: non-smoker  Pt meds include: Statin : No: Lipitor was stopped by her PCP due to myalgias ASA: Yes Other anticoagulants/antiplatelets: no   Past Medical History  Diagnosis Date  . Hypothyroidism   . Depression   . Carotid artery disease     Carotid duplex showed 76-73% RICA and 4-19% LICA  . Hypertension   . Hypercholesteremia   . Atrial tachycardia     a. Noted 02/2013 on EKG.    Social History History  Substance Use Topics  . Smoking status: Never Smoker   . Smokeless tobacco: Never Used  . Alcohol Use: No    Family History Family History  Problem Relation Age of Onset  . Diabetes Mother   . Heart disease Mother     After age 78  . Hyperlipidemia Mother   . Hypertension Mother   . Heart disease Sister     After age 28  . Asthma Sister   . Hyperlipidemia Sister     Surgical History Past Surgical History  Procedure Laterality Date  . Breast surgery    . Cesarean section    . Eye surgery Bilateral     cataract    Allergies  Allergen Reactions  . Sulfonamide Derivatives     REACTION: ORAL S.E.    Current Outpatient Prescriptions  Medication Sig Dispense Refill  . amLODipine (NORVASC) 5 MG tablet Take 1 tablet (5 mg total) by mouth daily.  90 tablet  4  . cyanocobalamin (,VITAMIN  B-12,) 1000 MCG/ML injection Inject 1 mL (1,000 mcg total) into the skin once.  30 mL  1  . donepezil (ARICEPT) 5 MG tablet Take 1 tablet (5 mg total) by mouth at bedtime.  90 tablet  4  . escitalopram (LEXAPRO) 5 MG tablet Take 1 tablet (5 mg total) by mouth daily.  90 tablet  4  . levothyroxine (SYNTHROID, LEVOTHROID) 100 MCG tablet Take 1 tablet (100 mcg total) by mouth daily before breakfast.  90 tablet  4  . metoprolol (LOPRESSOR) 50 MG tablet Take 1 tablet (50 mg total) by mouth 2 (two) times daily.  180 tablet  4   No current facility-administered medications for this visit.    Review of Systems : See HPI for pertinent positives and negatives.  Physical Examination  Filed Vitals:   04/20/14 1125 04/20/14 1128  BP: 122/62 130/63  Pulse: 57 58  Resp:  14  Height:  5\' 2"  (1.575 m)  Weight:  140 lb (63.504 kg)  SpO2:  97%   Body mass index is 25.6 kg/(m^2).  General: WDWN female in NAD, daughter in law with patient. GAIT: normal Eyes: PERRLA Pulmonary:  Non-labored, CTAB, Negative  Rales, Negative rhonchi, & Negative wheezing.  Cardiac: regular Rhythm, no detected murmur.  VASCULAR EXAM Carotid Bruits Right Left   Positive, mild Negative  Aorta is not palpable. Radial pulses are 1+ right, 2+ left, palpable.                                                                                                                           LE Pulses Right Left       POPLITEAL  not palpable   not palpable       POSTERIOR TIBIAL  2+ palpable   2+ palpable        DORSALIS PEDIS      ANTERIOR TIBIAL 2+ palpable  2+ palpable     Gastrointestinal: soft, nontender, BS WNL, no r/g,  negative masses palpated.  Musculoskeletal: Negative muscle atrophy/wasting. M/S 5/5 throughout, Extremities without ischemic changes.  Neurologic: A&O X 3; Appropriate Affect ; SENSATION ;normal;  Speech is normal CN 2-12 intact, Pain and light touch intact in extremities, Motor exam as listed  above.   Non-Invasive Vascular Imaging CAROTID DUPLEX 04/20/2014   CEREBROVASCULAR DUPLEX EVALUATION    INDICATION: Carotid artery disease     PREVIOUS INTERVENTION(S):     DUPLEX EXAM:     RIGHT  LEFT  Peak Systolic Velocities (cm/s) End Diastolic Velocities (cm/s) Plaque LOCATION Peak Systolic Velocities (cm/s) End Diastolic Velocities (cm/s) Plaque  86 12  CCA PROXIMAL 99 13   63 11  CCA MID 79 12   62 11  CCA DISTAL 65 8   194 0 HT ECA 102 0   222 22 CP ICA PROXIMAL 92 18 CP  253 42  ICA MID 92 18   72 16  ICA DISTAL 83 19     4.01 ICA / CCA Ratio (PSV) 1.16  Antegrade  Vertebral Flow Antegrade   448 Brachial Systolic Pressure (mmHg) 185  Triphasic  Brachial Artery Waveforms Triphasic     Plaque Morphology:  HM = Homogeneous, HT = Heterogeneous, CP = Calcific Plaque, SP = Smooth Plaque, IP = Irregular Plaque     ADDITIONAL FINDINGS:     IMPRESSION: Right internal carotid artery velocities suggest a 40-59% stenosis.  Left internal carotid artery velocities suggest a <40% stenosis.     Compared to the previous exam:  No significant change in velocities when compared to the last exam on 10/19/2013.     Assessment: Maria Clarke is a 80 y.o. female who presents with asymptomatic 40 - 59 % right ICA stenosis and <40% left ICA stenosis. The  ICA stenosis is  Unchanged from previous exam.  Plan: Follow-up in 6 months with Carotid Duplex scan.   I discussed in depth with the patient the nature of atherosclerosis, and emphasized the importance of maximal medical management including strict control of blood pressure, blood glucose, and lipid levels, obtaining regular exercise, and continued cessation of smoking.  The patient is aware that without maximal medical management the underlying atherosclerotic disease process will progress, limiting the benefit of any interventions. The patient was given information about stroke prevention and what symptoms should prompt  the  patient to seek immediate medical care. Thank you for allowing [place] to participate in this patient's care.  Clemon Chambers, RN, MSN, FNP-C [facility] Office: [phone]  Clinic Physician: Early  04/20/2014 11:49 AM

## 2014-04-20 NOTE — Patient Instructions (Signed)
Stroke Prevention Some medical conditions and behaviors are associated with an increased chance of having a stroke. You may prevent a stroke by making healthy choices and managing medical conditions. HOW CAN I REDUCE MY RISK OF HAVING A STROKE?   Stay physically active. Get at least 30 minutes of activity on most or all days.  Do not smoke. It may also be helpful to avoid exposure to secondhand smoke.  Limit alcohol use. Moderate alcohol use is considered to be:  No more than 2 drinks per day for men.  No more than 1 drink per day for nonpregnant women.  Eat healthy foods. This involves:  Eating 5 or more servings of fruits and vegetables a day.  Making dietary changes that address high blood pressure (hypertension), high cholesterol, diabetes, or obesity.  Manage your cholesterol levels.  Making food choices that are high in fiber and low in saturated fat, trans fat, and cholesterol may control cholesterol levels.  Take any prescribed medicines to control cholesterol as directed by your health care provider.  Manage your diabetes.  Controlling your carbohydrate and sugar intake is recommended to manage diabetes.  Take any prescribed medicines to control diabetes as directed by your health care provider.  Control your hypertension.  Making food choices that are low in salt (sodium), saturated fat, trans fat, and cholesterol is recommended to manage hypertension.  Take any prescribed medicines to control hypertension as directed by your health care provider.  Maintain a healthy weight.  Reducing calorie intake and making food choices that are low in sodium, saturated fat, trans fat, and cholesterol are recommended to manage weight.  Stop drug abuse.  Avoid taking birth control pills.  Talk to your health care provider about the risks of taking birth control pills if you are over 35 years old, smoke, get migraines, or have ever had a blood clot.  Get evaluated for sleep  disorders (sleep apnea).  Talk to your health care provider about getting a sleep evaluation if you snore a lot or have excessive sleepiness.  Take medicines only as directed by your health care provider.  For some people, aspirin or blood thinners (anticoagulants) are helpful in reducing the risk of forming abnormal blood clots that can lead to stroke. If you have the irregular heart rhythm of atrial fibrillation, you should be on a blood thinner unless there is a good reason you cannot take them.  Understand all your medicine instructions.  Make sure that other conditions (such as anemia or atherosclerosis) are addressed. SEEK IMMEDIATE MEDICAL CARE IF:   You have sudden weakness or numbness of the face, arm, or leg, especially on one side of the body.  Your face or eyelid droops to one side.  You have sudden confusion.  You have trouble speaking (aphasia) or understanding.  You have sudden trouble seeing in one or both eyes.  You have sudden trouble walking.  You have dizziness.  You have a loss of balance or coordination.  You have a sudden, severe headache with no known cause.  You have new chest pain or an irregular heartbeat. Any of these symptoms may represent a serious problem that is an emergency. Do not wait to see if the symptoms will go away. Get medical help at once. Call your local emergency services (911 in [place] Do not drive yourself to the hospital. Document Released: 08/02/2004 Document Revised: 11/09/2013 Document Reviewed: 12/26/2012 Exit[employer] Patient Information 2015 [employer]. This information is not intended to replace advice given   to you by your health care provider. Make sure you discuss any questions you have with your health care provider.  

## 2014-06-10 ENCOUNTER — Telehealth: Payer: Self-pay | Admitting: Family Medicine

## 2014-06-10 DIAGNOSIS — I10 Essential (primary) hypertension: Secondary | ICD-10-CM

## 2014-06-10 MED ORDER — AMLODIPINE BESYLATE 5 MG PO TABS: 5.0000 mg | ORAL_TABLET | Freq: Every day | ORAL | Status: DC

## 2014-06-10 NOTE — Telephone Encounter (Signed)
[place] [place] [phone] is requesting re-fill on amLODipine (NORVASC) 5 MG tablet

## 2014-08-23 ENCOUNTER — Telehealth: Payer: Self-pay

## 2014-08-23 DIAGNOSIS — N912 Amenorrhea, unspecified: Secondary | ICD-10-CM

## 2014-08-23 MED ORDER — CYANOCOBALAMIN 1000 MCG/ML IJ SOLN: 1000.0000 ug | Freq: Once | INTRAMUSCULAR | Status: DC

## 2014-08-23 NOTE — Telephone Encounter (Signed)
Rx sent to pharmacy   

## 2014-08-23 NOTE — Telephone Encounter (Signed)
[facility] refill request for CYANOCOBALAMIN 1,000 MCG/ML

## 2014-09-23 ENCOUNTER — Other Ambulatory Visit: Payer: Self-pay | Admitting: *Deleted

## 2014-09-23 DIAGNOSIS — E038 Other specified hypothyroidism: Secondary | ICD-10-CM

## 2014-09-23 MED ORDER — LEVOTHYROXINE SODIUM 100 MCG PO TABS: 100.0000 ug | ORAL_TABLET | Freq: Every day | ORAL | Status: DC

## 2014-10-22 ENCOUNTER — Encounter: Payer: Self-pay | Admitting: Family

## 2014-10-25 ENCOUNTER — Encounter: Payer: Self-pay | Admitting: Family

## 2014-10-25 ENCOUNTER — Ambulatory Visit (HOSPITAL_COMMUNITY)
Admission: RE | Admit: 2014-10-25 | Discharge: 2014-10-25 | Disposition: A | Discharge status: Home / Self Care | Payer: Medicare Other | Source: Ambulatory Visit | Attending: Family | Admitting: Family

## 2014-10-25 ENCOUNTER — Ambulatory Visit (INDEPENDENT_AMBULATORY_CARE_PROVIDER_SITE_OTHER): Payer: Medicare Other | Admitting: Family

## 2014-10-25 VITALS — BP 155/73 | HR 56 | Resp 14 | Ht 61.5 in | Wt 133.0 lb

## 2014-10-25 DIAGNOSIS — I6523 Occlusion and stenosis of bilateral carotid arteries: Secondary | ICD-10-CM

## 2014-10-25 DIAGNOSIS — R030 Elevated blood-pressure reading, without diagnosis of hypertension: Secondary | ICD-10-CM | POA: Insufficient documentation

## 2014-10-25 DIAGNOSIS — IMO0001 Reserved for inherently not codable concepts without codable children: Secondary | ICD-10-CM

## 2014-10-25 NOTE — Progress Notes (Signed)
Filed Vitals:   10/25/14 1335 10/25/14 1339 10/25/14 1346 10/25/14 1348  BP: 161/72 150/69 148/69 155/73  Pulse: 58 55 53 56  Resp:  14    Height:  5' 1.5" (1.562 m)    Weight:  133 lb (60.328 kg)    SpO2:  97%

## 2014-10-25 NOTE — Progress Notes (Signed)
Established Carotid Patient   History of Present Illness  Maria Clarke is a 81 y.o. female patient of Dr. Trula Slade who is here today for followup of her carotid stenosis. Her carotid disease was initially detected by bruit.   Patient has not had previous carotid artery intervention. Patient reports that she has never had a stroke or TIA, specifically she denies any history of amaurosis fugax or monocular blindness, denies a history of unilateral facial drooping, denies a history of hemiplegia, denies a history of receptive or expressive aphasia.   She takes 2 Advil daily since 2015 for arthritis pain.  Patient denies claudication symptoms with walking.  Pt denies New Medical or Surgical History.  Pt Diabetic: No Pt smoker: non-smoker  Pt meds include: Statin : No: Lipitor was stopped by her PCP due to myalgias ASA: Yes Other anticoagulants/antiplatelets: no   Past Medical History  Diagnosis Date  . Hypothyroidism   . Depression   . Carotid artery disease     Carotid duplex showed 81-77% RICA and 1-16% LICA  . Hypertension   . Hypercholesteremia   . Atrial tachycardia     a. Noted 02/2013 on EKG.    Social History History  Substance Use Topics  . Smoking status: Never Smoker   . Smokeless tobacco: Never Used  . Alcohol Use: No    Family History Family History  Problem Relation Age of Onset  . Diabetes Mother   . Heart disease Mother     After age 10  . Hyperlipidemia Mother   . Hypertension Mother   . Heart disease Sister     After age 40  . Asthma Sister   . Hyperlipidemia Sister     Surgical History Past Surgical History  Procedure Laterality Date  . Breast surgery    . Cesarean section    . Eye surgery Bilateral     cataract    Allergies  Allergen Reactions  . Sulfonamide Derivatives Other (See Comments)    REACTION: ORAL S.E.    Current Outpatient Prescriptions  Medication Sig Dispense Refill  . amLODipine (NORVASC) 5 MG tablet Take 1  tablet (5 mg total) by mouth daily. 90 tablet 4  . aspirin 81 MG tablet Take 81 mg by mouth daily.    . cyanocobalamin (,VITAMIN B-12,) 1000 MCG/ML injection Inject 1 mL (1,000 mcg total) into the skin once. 30 mL 1  . donepezil (ARICEPT) 5 MG tablet Take 1 tablet (5 mg total) by mouth at bedtime. 90 tablet 4  . escitalopram (LEXAPRO) 5 MG tablet Take 1 tablet (5 mg total) by mouth daily. 90 tablet 4  . levothyroxine (SYNTHROID, LEVOTHROID) 100 MCG tablet Take 1 tablet (100 mcg total) by mouth daily before breakfast. 90 tablet 4  . metoprolol (LOPRESSOR) 50 MG tablet Take 1 tablet (50 mg total) by mouth 2 (two) times daily. 180 tablet 4   No current facility-administered medications for this visit.    Review of Systems : See HPI for pertinent positives and negatives.  Physical Examination  Filed Vitals:   10/25/14 1335 10/25/14 1339  BP: 161/72 150/69  Pulse: 58 55  Resp:  14  Height:  5' 1.5" (1.562 m)  Weight:  133 lb (60.328 kg)  SpO2:  97%   Body mass index is 24.73 kg/(m^2).  General: WDWN female in NAD, daughter in law with patient. GAIT: normal Eyes: PERRLA Pulmonary: Non-labored, CTAB, Negative Rales, Negative rhonchi, & Negative wheezing.  Cardiac: regular Rhythm, no detected murmur.  VASCULAR EXAM Carotid Bruits Right Left   Positive, mild Negative   Aorta is not palpable. Radial pulses are 1+ right, 2+ left, palpable.      LE Pulses Right Left   POPLITEAL not palpable  not palpable   POSTERIOR TIBIAL 2+ palpable  2+ palpable    DORSALIS PEDIS  ANTERIOR TIBIAL 2+ palpable  2+ palpable     Gastrointestinal: soft, nontender, BS WNL, no r/g, no palpable masses palpated.  Musculoskeletal: Negative muscle atrophy/wasting. M/S 5/5 throughout, Extremities without ischemic  changes.  Neurologic: A&O X 3; Appropriate Affect, Speech is normal CN 2-12 intact, Pain and light touch intact in extremities, Motor exam as listed above.           Non-Invasive Vascular Imaging CAROTID DUPLEX 10/25/2014   CEREBROVASCULAR DUPLEX EVALUATION    INDICATION: Carotid stenosis    PREVIOUS INTERVENTION(S): NA    DUPLEX EXAM:     RIGHT  LEFT  Peak Systolic Velocities (cm/s) End Diastolic Velocities (cm/s) Plaque LOCATION Peak Systolic Velocities (cm/s) End Diastolic Velocities (cm/s) Plaque  80 12  CCA PROXIMAL 106 17   63 10  CCA MID 87 17   72 11 HT CCA DISTAL 76 14 HT  223 13 HT ECA 101 0 HT  236 41 HT ICA PROXIMAL 108 24 HT  92 16  ICA MID 94 21   52 9  ICA DISTAL 101 23     3.8 ICA / CCA Ratio (PSV) 1.24  Antegrade Vertebral Flow Antegrade  578 Brachial Systolic Pressure (mmHg) 469  Triphasic Brachial Artery Waveforms Triphasic    Plaque Morphology:  HM = Homogeneous, HT = Heterogeneous, CP = Calcific Plaque, SP = Smooth Plaque, IP = Irregular Plaque  ADDITIONAL FINDINGS:     IMPRESSION: Right internal carotid artery stenosis present in the 40%-59% range. Left internal carotid artery stenosis present in the less than 40% range. Right external carotid artery stenosis present.    Compared to the previous exam:  Essentially unchanged since study on 04/20/2014.      Assessment: CAMBELL Clarke is a 81 y.o. female who presents with asymptomatic 40%-59% right ICA stenosis and less than 40% left ICA stenosis.  Essentially unchanged since study on 04/20/2014.  Elevated blood pressure today, see Plan.  Plan:  Pt advised to follow up with her PCP as soon as possible re her elevated blood pressure. Follow-up in 1 year with Carotid Duplex.   I discussed in depth with the patient the nature of atherosclerosis, and emphasized the importance of maximal medical management including strict control of blood pressure, blood glucose, and lipid levels, obtaining  regular exercise, and continued cessation of smoking.  The patient is aware that without maximal medical management the underlying atherosclerotic disease process will progress, limiting the benefit of any interventions. The patient was given information about stroke prevention and what symptoms should prompt the patient to seek immediate medical care. Thank you for allowing [place] to participate in this patient's care.  Clemon Chambers, RN, MSN, FNP-C [facility] Office: [facility] Physician: Trula Slade  10/25/2014 1:42 PM

## 2014-10-25 NOTE — Patient Instructions (Signed)
Stroke Prevention Some medical conditions and behaviors are associated with an increased chance of having a stroke. You may prevent a stroke by making healthy choices and managing medical conditions. HOW CAN I REDUCE MY RISK OF HAVING A STROKE?   Stay physically active. Get at least 30 minutes of activity on most or all days.  Do not smoke. It may also be helpful to avoid exposure to secondhand smoke.  Limit alcohol use. Moderate alcohol use is considered to be:  No more than 2 drinks per day for men.  No more than 1 drink per day for nonpregnant women.  Eat healthy foods. This involves:  Eating 5 or more servings of fruits and vegetables a day.  Making dietary changes that address high blood pressure (hypertension), high cholesterol, diabetes, or obesity.  Manage your cholesterol levels.  Making food choices that are high in fiber and low in saturated fat, trans fat, and cholesterol may control cholesterol levels.  Take any prescribed medicines to control cholesterol as directed by your health care provider.  Manage your diabetes.  Controlling your carbohydrate and sugar intake is recommended to manage diabetes.  Take any prescribed medicines to control diabetes as directed by your health care provider.  Control your hypertension.  Making food choices that are low in salt (sodium), saturated fat, trans fat, and cholesterol is recommended to manage hypertension.  Take any prescribed medicines to control hypertension as directed by your health care provider.  Maintain a healthy weight.  Reducing calorie intake and making food choices that are low in sodium, saturated fat, trans fat, and cholesterol are recommended to manage weight.  Stop drug abuse.  Avoid taking birth control pills.  Talk to your health care provider about the risks of taking birth control pills if you are over 35 years old, smoke, get migraines, or have ever had a blood clot.  Get evaluated for sleep  disorders (sleep apnea).  Talk to your health care provider about getting a sleep evaluation if you snore a lot or have excessive sleepiness.  Take medicines only as directed by your health care provider.  For some people, aspirin or blood thinners (anticoagulants) are helpful in reducing the risk of forming abnormal blood clots that can lead to stroke. If you have the irregular heart rhythm of atrial fibrillation, you should be on a blood thinner unless there is a good reason you cannot take them.  Understand all your medicine instructions.  Make sure that other conditions (such as anemia or atherosclerosis) are addressed. SEEK IMMEDIATE MEDICAL CARE IF:   You have sudden weakness or numbness of the face, arm, or leg, especially on one side of the body.  Your face or eyelid droops to one side.  You have sudden confusion.  You have trouble speaking (aphasia) or understanding.  You have sudden trouble seeing in one or both eyes.  You have sudden trouble walking.  You have dizziness.  You have a loss of balance or coordination.  You have a sudden, severe headache with no known cause.  You have new chest pain or an irregular heartbeat. Any of these symptoms may represent a serious problem that is an emergency. Do not wait to see if the symptoms will go away. Get medical help at once. Call your local emergency services (911 in [place] Do not drive yourself to the hospital. Document Released: 08/02/2004 Document Revised: 11/09/2013 Document Reviewed: 12/26/2012 Exit[employer] Patient Information 2015 [employer]. This information is not intended to replace advice given   to you by your health care provider. Make sure you discuss any questions you have with your health care provider.  

## 2014-11-01 NOTE — Addendum Note (Signed)
Addended by: Mena Goes on: 11/01/2014 04:02 PM   Modules accepted: Orders

## 2014-11-02 DIAGNOSIS — L570 Actinic keratosis: Secondary | ICD-10-CM | POA: Diagnosis not present

## 2014-11-24 ENCOUNTER — Telehealth: Payer: Self-pay | Admitting: Family Medicine

## 2014-11-24 DIAGNOSIS — M25569 Pain in unspecified knee: Secondary | ICD-10-CM

## 2014-11-24 NOTE — Telephone Encounter (Signed)
Pt needs referral to dr whitfield  For knee pain.  Son states best to call him for appt info.mr hollenkamp states dr todd aware of the issue.

## 2014-11-29 DIAGNOSIS — M1712 Unilateral primary osteoarthritis, left knee: Secondary | ICD-10-CM | POA: Diagnosis not present

## 2015-03-09 ENCOUNTER — Other Ambulatory Visit: Payer: Self-pay

## 2015-03-09 DIAGNOSIS — I10 Essential (primary) hypertension: Secondary | ICD-10-CM

## 2015-03-09 MED ORDER — METOPROLOL TARTRATE 50 MG PO TABS: 50.0000 mg | ORAL_TABLET | Freq: Two times a day (BID) | ORAL | Status: DC

## 2015-03-11 ENCOUNTER — Other Ambulatory Visit: Payer: Self-pay | Admitting: *Deleted

## 2015-03-11 MED ORDER — DONEPEZIL HCL 5 MG PO TABS: 5.0000 mg | ORAL_TABLET | Freq: Every day | ORAL | Status: DC

## 2015-04-13 ENCOUNTER — Other Ambulatory Visit: Payer: Self-pay | Admitting: *Deleted

## 2015-04-13 DIAGNOSIS — I10 Essential (primary) hypertension: Secondary | ICD-10-CM

## 2015-04-13 MED ORDER — METOPROLOL TARTRATE 50 MG PO TABS: 50.0000 mg | ORAL_TABLET | Freq: Two times a day (BID) | ORAL | Status: DC

## 2015-05-04 ENCOUNTER — Ambulatory Visit (INDEPENDENT_AMBULATORY_CARE_PROVIDER_SITE_OTHER): Payer: Medicare Other | Admitting: *Deleted

## 2015-05-04 DIAGNOSIS — Z23 Encounter for immunization: Secondary | ICD-10-CM

## 2015-05-09 ENCOUNTER — Other Ambulatory Visit: Payer: Self-pay | Admitting: *Deleted

## 2015-05-09 DIAGNOSIS — F339 Major depressive disorder, recurrent, unspecified: Secondary | ICD-10-CM

## 2015-05-09 MED ORDER — ESCITALOPRAM OXALATE 5 MG PO TABS: 5.0000 mg | ORAL_TABLET | Freq: Every day | ORAL | Status: DC

## 2015-05-10 ENCOUNTER — Ambulatory Visit (INDEPENDENT_AMBULATORY_CARE_PROVIDER_SITE_OTHER): Payer: Medicare Other | Admitting: Family Medicine

## 2015-05-10 VITALS — BP 120/70

## 2015-05-10 DIAGNOSIS — H9193 Unspecified hearing loss, bilateral: Secondary | ICD-10-CM | POA: Diagnosis not present

## 2015-05-10 DIAGNOSIS — I6523 Occlusion and stenosis of bilateral carotid arteries: Secondary | ICD-10-CM | POA: Diagnosis not present

## 2015-05-10 NOTE — Progress Notes (Signed)
   Subjective:    Patient ID: Maria Clarke, female    DOB: [birth year], 81 y.o.   MRN: [id number]  HPI Maria Clarke is a 81 year old female who comes in today accompanied by her daughter-in-law Maria Clarke because a hearing loss     Review of Systems Review of systems negative except she gets in hearing aids and they didn't work    Objective:   Physical Exam Well-developed well-nourished female no acute distress vital signs stable she's afebrile  Examination ears bilateral cerumen impactions. After suction irrigation the wax was removed. Her hearing is still impaired. 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

## 2015-05-10 NOTE — Patient Instructions (Signed)
Return when necessary 

## 2015-05-25 ENCOUNTER — Encounter: Payer: Self-pay | Admitting: Family Medicine

## 2015-05-25 ENCOUNTER — Ambulatory Visit (INDEPENDENT_AMBULATORY_CARE_PROVIDER_SITE_OTHER): Payer: Medicare Other | Admitting: Family Medicine

## 2015-05-25 VITALS — BP 120/80 | Temp 98.1°F | Wt 128.0 lb

## 2015-05-25 DIAGNOSIS — B029 Zoster without complications: Secondary | ICD-10-CM | POA: Diagnosis not present

## 2015-05-25 DIAGNOSIS — I6523 Occlusion and stenosis of bilateral carotid arteries: Secondary | ICD-10-CM

## 2015-05-25 MED ORDER — ACYCLOVIR 400 MG PO TABS: 400.0000 mg | ORAL_TABLET | Freq: Every day | ORAL | Status: DC

## 2015-05-25 MED ORDER — PREDNISONE 20 MG PO TABS: ORAL_TABLET | ORAL | Status: DC

## 2015-05-25 MED ORDER — HYDROCODONE-ACETAMINOPHEN 5-300 MG PO TABS: ORAL_TABLET | ORAL | Status: DC

## 2015-05-25 NOTE — Progress Notes (Signed)
Pre visit review using our clinic review tool, if applicable. No additional management support is needed unless otherwise documented below in the visit note. 

## 2015-05-25 NOTE — Progress Notes (Signed)
   Subjective:    Patient ID: Maria Clarke, female    DOB: 27-Jan-1922, 81 y.o.   MRN: [id number]  HPI Maria Clarke is a 81 year old widowed female nonsmoker who comes in today for evaluation of skin rash.  About a week ago she had pain in the back of her neck in a couple days later she broke out in a rash. It's very painful. Her pain level on a scale of 1-10 as a 10. She had a shingles vaccine in 2013  The rash is in the ear back of the scalp in the left side of her neck   Review of Systems    review of systems otherwise negative Objective:   Physical Exam Well-developed well-nourished female no acute distress vital signs stable she's afebrile rash consistent with shingles       Assessment & Plan:  Shingles......... antiviral medicine....... Prednisone.... Vicodin return when necessary.

## 2015-05-25 NOTE — Patient Instructions (Signed)
Acyclovir 400 mg........... 2 in the morning......... 3 at bedtime  Prednisone 20 mg.......... 2 tabs for 3 days then taper as outlined  Vicodin............... one half tab every 4-6 hours as needed for severe pain  Rest at home until next Monday  Do not drive while taking the pain medication

## 2015-06-30 ENCOUNTER — Other Ambulatory Visit: Payer: Self-pay

## 2015-06-30 DIAGNOSIS — I10 Essential (primary) hypertension: Secondary | ICD-10-CM

## 2015-06-30 MED ORDER — METOPROLOL TARTRATE 50 MG PO TABS: 50.0000 mg | ORAL_TABLET | Freq: Two times a day (BID) | ORAL | Status: DC

## 2015-06-30 NOTE — Telephone Encounter (Signed)
Rx request for metoprolol tartrate 50 mg tablet-Take 1 tablet by mouth twice daily #60  Rx sent to pharmacy.

## 2015-07-06 ENCOUNTER — Encounter: Payer: Self-pay | Admitting: *Deleted

## 2015-07-07 ENCOUNTER — Ambulatory Visit (INDEPENDENT_AMBULATORY_CARE_PROVIDER_SITE_OTHER): Payer: Medicare Other | Admitting: Family Medicine

## 2015-07-07 ENCOUNTER — Encounter: Payer: Self-pay | Admitting: Family Medicine

## 2015-07-07 ENCOUNTER — Ambulatory Visit (INDEPENDENT_AMBULATORY_CARE_PROVIDER_SITE_OTHER)
Admission: RE | Admit: 2015-07-07 | Discharge: 2015-07-07 | Disposition: A | Discharge status: Home / Self Care | Payer: Medicare Other | Source: Ambulatory Visit | Attending: Family Medicine | Admitting: Family Medicine

## 2015-07-07 DIAGNOSIS — L03114 Cellulitis of left upper limb: Secondary | ICD-10-CM

## 2015-07-07 DIAGNOSIS — M7989 Other specified soft tissue disorders: Secondary | ICD-10-CM | POA: Diagnosis not present

## 2015-07-07 DIAGNOSIS — W5501XD Bitten by cat, subsequent encounter: Secondary | ICD-10-CM

## 2015-07-07 DIAGNOSIS — I6523 Occlusion and stenosis of bilateral carotid arteries: Secondary | ICD-10-CM

## 2015-07-07 DIAGNOSIS — M19042 Primary osteoarthritis, left hand: Secondary | ICD-10-CM | POA: Diagnosis not present

## 2015-07-07 LAB — CBC WITH DIFFERENTIAL/PLATELET
BASOS ABS: 0 10*3/uL (ref 0.0–0.1)
BASOS PCT: 0.4 % (ref 0.0–3.0)
EOS ABS: 0.2 10*3/uL (ref 0.0–0.7)
Eosinophils Relative: 2.7 % (ref 0.0–5.0)
HCT: 38.6 % (ref 36.0–46.0)
Hemoglobin: 12.5 g/dL (ref 12.0–15.0)
LYMPHS ABS: 1.3 10*3/uL (ref 0.7–4.0)
Lymphocytes Relative: 14.5 % (ref 12.0–46.0)
MCHC: 32.5 g/dL (ref 30.0–36.0)
MCV: 90.9 fl (ref 78.0–100.0)
Monocytes Absolute: 0.8 10*3/uL (ref 0.1–1.0)
Monocytes Relative: 8.9 % (ref 3.0–12.0)
NEUTROS PCT: 73.5 % (ref 43.0–77.0)
Neutro Abs: 6.6 10*3/uL (ref 1.4–7.7)
PLATELETS: 349 10*3/uL (ref 150.0–400.0)
RBC: 4.25 Mil/uL (ref 3.87–5.11)
RDW: 15.2 % (ref 11.5–15.5)
WBC: 9 10*3/uL (ref 4.0–10.5)

## 2015-07-07 LAB — C-REACTIVE PROTEIN: CRP: 1 mg/dL (ref 0.5–20.0)

## 2015-07-07 MED ORDER — CEFTRIAXONE SODIUM 1 G IJ SOLR
1.0000 g | Freq: Once | INTRAMUSCULAR | Status: AC
  Administered 2015-07-07: 1 g via INTRAMUSCULAR

## 2015-07-07 NOTE — Progress Notes (Signed)
Pre visit review using our clinic review tool, if applicable. No additional management support is needed unless otherwise documented below in the visit note. 

## 2015-07-07 NOTE — Addendum Note (Signed)
Addended by: Elio Forget on: 07/07/2015 01:32 PM   Modules accepted: Orders

## 2015-07-07 NOTE — Progress Notes (Signed)
Subjective:    Patient ID: Maria Clarke, female    DOB: 04/03/1922, 81 y.o.   MRN: [id number]  HPI Patient seen with redness and cellulitis changes of the left hand and left forearm. About 3 weeks ago she reports a cat bite from her cat. Her cat has been fully vaccinated. Patient's last tetanus was August 2015. Patient was started on full 10 day course of Augmentin and her redness, pain, and heat left forearm almost fully resolved. Then, a few days ago she had increased redness and warmth and pain without any associated fever or chills. Another 10 day course of Augmentin was called in and she started on this 2 days ago. Her son relates that her forearm and hand are much less red today and patient states it feels better than it did 2 days ago. She's not had any fever. No nausea or vomiting. No confusion. She's been elevating this and using low heat-from heating pad  Past Medical History  Diagnosis Date  . Hypothyroidism   . Depression   . Carotid artery disease (HCC)     Carotid duplex showed [id number] RICA and [id number] LICA  . Hypertension   . Hypercholesteremia   . Atrial tachycardia ([place])     a. Noted 02/2013 on EKG.   Past Surgical History  Procedure Laterality Date  . Breast surgery Left     Benign Tumor- several years ago  . Cesarean section    . Eye surgery Bilateral     cataract    reports that she has never smoked. She has never used smokeless tobacco. She reports that she does not drink alcohol or use illicit drugs. family history includes Asthma in her sister; Diabetes in her mother and son; Heart disease in her brother, mother, and sister; Hyperlipidemia in her mother and sister; Hypertension in her mother and son. Allergies  Allergen Reactions  . Sulfonamide Derivatives Other (See Comments)    REACTION: ORAL S.E.      Review of Systems  Constitutional: Negative for fever and chills.  Respiratory: Negative for shortness of breath.   Gastrointestinal: Negative for  nausea and vomiting.  Neurological: Negative for dizziness and weakness.  Hematological: Negative for adenopathy.  Psychiatric/Behavioral: Negative for confusion.       Objective:   Physical Exam  Constitutional: She appears well-developed and well-nourished.  Cardiovascular: Normal rate and regular rhythm.   Pulmonary/Chest: Effort normal and breath sounds normal. No respiratory distress. She has no wheezes. She has no rales.  Musculoskeletal: She exhibits no edema.  Full range of motion all digits of the hand and wrist and elbow. No bony tenderness  Neurological: She is alert.  Skin:  Left hand is erythematous involving mostly the dorsal aspect down to visit PIP joint of several fingers. This extends to about two thirds the way up the forearm dorsally. No ventral involvement. Slightly warm to touch. Nontender. No skin breakdown. No fluctuance or evidence for abscess.          Assessment & Plan:  Recurrent cellulitis symptoms left forearm/hand following cat bite a few weeks ago. She had significant clinical improvement and near resolution after Augmentin and after starting back Augmentin couple days ago son states she is again seeing great improvement. She is nontoxic. Check CBC and CRP. X-rays left hand and forearm. Rule out osteomyelitis changes. We discussed possible admission for IV antibiotics but she has seen great improvement with addition back of Augmentin and will continue with this. Given ceftriaxone 1 g  IM for better capnocytophaga coverage.  Should have decent pasteurella and anaerobic coverage with Augmentin. Reassess tomorrow. Continue frequent elevation. Follow-up promptly for any fever, nausea, vomiting, confusion or other concerns

## 2015-07-07 NOTE — Patient Instructions (Signed)

## 2015-07-08 ENCOUNTER — Ambulatory Visit (INDEPENDENT_AMBULATORY_CARE_PROVIDER_SITE_OTHER): Payer: Medicare Other | Admitting: Family Medicine

## 2015-07-08 ENCOUNTER — Encounter: Payer: Self-pay | Admitting: Family Medicine

## 2015-07-08 VITALS — BP 160/70 | HR 114 | Temp 98.5°F | Resp 16 | Ht 61.5 in | Wt 126.2 lb

## 2015-07-08 DIAGNOSIS — I6523 Occlusion and stenosis of bilateral carotid arteries: Secondary | ICD-10-CM

## 2015-07-08 DIAGNOSIS — L03114 Cellulitis of left upper limb: Secondary | ICD-10-CM

## 2015-07-08 NOTE — Patient Instructions (Signed)
Finish out the Augmentin Elevate arm frequently Follow up promptly for any fever or other concerns.

## 2015-07-08 NOTE — Progress Notes (Signed)
   Subjective:    Patient ID: Maria Clarke, female    DOB: 1921-07-18, 81 y.o.   MRN: [id number]  HPI Follow-up regarding cat bite related cellulitis left forearm. Refer to note from yesterday:  07-07-15 note: "Patient seen with redness and cellulitis changes of the left hand and left forearm.  About 3 weeks ago she reports a cat bite from her cat.  Her cat has been fully vaccinated. Patient's last tetanus was August 2015.  Patient was started on full 10 day course of Augmentin and her redness, pain, and heat left forearm almost fully resolved.  Then, a few days ago she had increased redness and warmth and pain without any associated fever or chills. Another 10 day course of Augmentin was called in and she started on this 2 days ago. Her son relates that her forearm and hand are much less red today and patient states it feels better than it did 2 days ago. She's not had any fever. No nausea or vomiting. No confusion.  She's been elevating this and using low heat-from heating pad"  CRP of 1.0.  White blood count normal. X-rays no evidence for osteomyelitis. We gave ceftriaxone 1 g IM and continued Augmentin. She feels she's had even further improvement.  No fever or chills.She has less redness today. Still warm to touch  Past Medical History  Diagnosis Date  . Hypothyroidism   . Depression   . Carotid artery disease (HCC)     Carotid duplex showed [id number] RICA and [id number] LICA  . Hypertension   . Hypercholesteremia   . Atrial tachycardia ([place])     a. Noted 02/2013 on EKG.   Past Surgical History  Procedure Laterality Date  . Breast surgery Left     Benign Tumor- several years ago  . Cesarean section    . Eye surgery Bilateral     cataract    reports that she has never smoked. She has never used smokeless tobacco. She reports that she does not drink alcohol or use illicit drugs. family history includes Asthma in her sister; Diabetes in her mother and son; Heart disease in her brother,  mother, and sister; Hyperlipidemia in her mother and sister; Hypertension in her mother and son. Allergies  Allergen Reactions  . Sulfonamide Derivatives Other (See Comments)    REACTION: ORAL S.E.       Review of Systems  Constitutional: Negative for fever and chills.  Gastrointestinal: Negative for nausea.  Neurological: Negative for dizziness.       Objective:   Physical Exam  Constitutional: She appears well-developed and well-nourished.  Cardiovascular: Normal rate.   Pulmonary/Chest: Effort normal and breath sounds normal. No respiratory distress. She has no wheezes. She has no rales.  Skin:   Erythema dorsum left hand and distal one half of the left forearm. Slightly warm to touch. No fluctuance nontender          Assessment & Plan:  Cat bite related cellulitis left hand and forearm clinically improving. No evidence for osteomyelitis by recent x-ray  CRP of 1. continue Augmentin 875 mg twice daily. Suggest follow-up with primary in one week to reasess.

## 2015-07-08 NOTE — Progress Notes (Signed)
Pre visit review using our clinic review tool, if applicable. No additional management support is needed unless otherwise documented below in the visit note. 

## 2015-07-14 ENCOUNTER — Other Ambulatory Visit: Payer: Self-pay | Admitting: *Deleted

## 2015-07-14 MED ORDER — AMOXICILLIN-POT CLAVULANATE 875-125 MG PO TABS: 1.0000 | ORAL_TABLET | Freq: Every day | ORAL | Status: DC

## 2015-08-02 ENCOUNTER — Other Ambulatory Visit: Payer: Self-pay | Admitting: Family Medicine

## 2015-08-02 DIAGNOSIS — I10 Essential (primary) hypertension: Secondary | ICD-10-CM

## 2015-08-02 MED ORDER — AMLODIPINE BESYLATE 5 MG PO TABS: 5.0000 mg | ORAL_TABLET | Freq: Every day | ORAL | Status: DC

## 2015-10-06 ENCOUNTER — Telehealth: Payer: Self-pay | Admitting: Family Medicine

## 2015-10-06 NOTE — Telephone Encounter (Signed)
Pt needs cpx before nov 20017. Can I create 30 min slot? Apolonio Schneiders also for [facility] and [employer] their cpx was sch for may 2017

## 2015-10-07 NOTE — Telephone Encounter (Signed)
Per Dr Sherren Mocha you can block 2 spots.

## 2015-10-11 ENCOUNTER — Other Ambulatory Visit: Payer: Self-pay | Admitting: *Deleted

## 2015-10-11 DIAGNOSIS — N912 Amenorrhea, unspecified: Secondary | ICD-10-CM

## 2015-10-11 MED ORDER — CYANOCOBALAMIN 1000 MCG/ML IJ SOLN: 1000.0000 ug | Freq: Once | INTRAMUSCULAR | Status: DC

## 2015-10-14 NOTE — Telephone Encounter (Signed)
Talk with Maria Clarke and he will check with medicare to see if cpx must be year to date or 1 per calendar year

## 2015-10-19 ENCOUNTER — Encounter: Payer: Self-pay | Admitting: Family

## 2015-10-25 ENCOUNTER — Other Ambulatory Visit: Payer: Self-pay | Admitting: General Practice

## 2015-10-25 DIAGNOSIS — E038 Other specified hypothyroidism: Secondary | ICD-10-CM

## 2015-10-25 MED ORDER — LEVOTHYROXINE SODIUM 100 MCG PO TABS: 100.0000 ug | ORAL_TABLET | Freq: Every day | ORAL | Status: DC

## 2015-10-26 ENCOUNTER — Ambulatory Visit (INDEPENDENT_AMBULATORY_CARE_PROVIDER_SITE_OTHER): Payer: Medicare Other | Admitting: Family Medicine

## 2015-10-26 ENCOUNTER — Encounter: Payer: Self-pay | Admitting: Family Medicine

## 2015-10-26 ENCOUNTER — Ambulatory Visit: Payer: Medicare Other | Admitting: Family Medicine

## 2015-10-26 VITALS — BP 120/70 | Temp 97.7°F | Ht 62.0 in | Wt 123.0 lb

## 2015-10-26 DIAGNOSIS — R413 Other amnesia: Secondary | ICD-10-CM

## 2015-10-26 DIAGNOSIS — H9193 Unspecified hearing loss, bilateral: Secondary | ICD-10-CM

## 2015-10-26 DIAGNOSIS — N912 Amenorrhea, unspecified: Secondary | ICD-10-CM

## 2015-10-26 DIAGNOSIS — E039 Hypothyroidism, unspecified: Secondary | ICD-10-CM | POA: Diagnosis not present

## 2015-10-26 DIAGNOSIS — I10 Essential (primary) hypertension: Secondary | ICD-10-CM | POA: Diagnosis not present

## 2015-10-26 DIAGNOSIS — E038 Other specified hypothyroidism: Secondary | ICD-10-CM

## 2015-10-26 DIAGNOSIS — F339 Major depressive disorder, recurrent, unspecified: Secondary | ICD-10-CM

## 2015-10-26 LAB — CBC WITH DIFFERENTIAL/PLATELET
BASOS PCT: 0.4 % (ref 0.0–3.0)
Basophils Absolute: 0 10*3/uL (ref 0.0–0.1)
EOS PCT: 0.8 % (ref 0.0–5.0)
Eosinophils Absolute: 0 10*3/uL (ref 0.0–0.7)
HCT: 41.5 % (ref 36.0–46.0)
Hemoglobin: 14 g/dL (ref 12.0–15.0)
LYMPHS ABS: 1.5 10*3/uL (ref 0.7–4.0)
Lymphocytes Relative: 24.2 % (ref 12.0–46.0)
MCHC: 33.7 g/dL (ref 30.0–36.0)
MCV: 85.4 fl (ref 78.0–100.0)
MONO ABS: 0.4 10*3/uL (ref 0.1–1.0)
Monocytes Relative: 6.2 % (ref 3.0–12.0)
NEUTROS PCT: 68.4 % (ref 43.0–77.0)
Neutro Abs: 4.1 10*3/uL (ref 1.4–7.7)
Platelets: 162 10*3/uL (ref 150.0–400.0)
RBC: 4.86 Mil/uL (ref 3.87–5.11)
RDW: 15.1 % (ref 11.5–15.5)
WBC: 6 10*3/uL (ref 4.0–10.5)

## 2015-10-26 LAB — POCT URINALYSIS DIPSTICK
Bilirubin, UA: NEGATIVE
GLUCOSE UA: NEGATIVE
KETONES UA: NEGATIVE
Leukocytes, UA: NEGATIVE
Nitrite, UA: NEGATIVE
Protein, UA: NEGATIVE
SPEC GRAV UA: 1.02
Urobilinogen, UA: 0.2
pH, UA: 6.5

## 2015-10-26 LAB — BASIC METABOLIC PANEL
BUN: 13 mg/dL (ref 6–23)
CALCIUM: 10.2 mg/dL (ref 8.4–10.5)
CO2: 31 meq/L (ref 19–32)
CREATININE: 0.74 mg/dL (ref 0.40–1.20)
Chloride: 105 mEq/L (ref 96–112)
GFR: 78.02 mL/min (ref >60.00)
Glucose, Bld: 103 mg/dL — ABNORMAL HIGH (ref 70–99)
Potassium: 3.8 mEq/L (ref 3.5–5.1)
Sodium: 143 mEq/L (ref 135–145)

## 2015-10-26 LAB — TSH: TSH: 0.16 u[IU]/mL — AB (ref 0.35–4.50)

## 2015-10-26 MED ORDER — CYANOCOBALAMIN 1000 MCG/ML IJ SOLN: 1000.0000 ug | Freq: Once | INTRAMUSCULAR | Status: DC

## 2015-10-26 MED ORDER — METOPROLOL TARTRATE 50 MG PO TABS: ORAL_TABLET | ORAL | Status: DC

## 2015-10-26 MED ORDER — DONEPEZIL HCL 5 MG PO TABS: 5.0000 mg | ORAL_TABLET | Freq: Every day | ORAL | Status: DC

## 2015-10-26 MED ORDER — LEVOTHYROXINE SODIUM 100 MCG PO TABS: 100.0000 ug | ORAL_TABLET | Freq: Every day | ORAL | Status: DC

## 2015-10-26 MED ORDER — AMLODIPINE BESYLATE 5 MG PO TABS: 5.0000 mg | ORAL_TABLET | Freq: Every day | ORAL | Status: DC

## 2015-10-26 MED ORDER — ESCITALOPRAM OXALATE 5 MG PO TABS: 5.0000 mg | ORAL_TABLET | Freq: Every day | ORAL | Status: DC

## 2015-10-26 NOTE — Telephone Encounter (Signed)
[name] will have his wife call back to rsc their cpx

## 2015-10-26 NOTE — Progress Notes (Signed)
Pre visit review using our clinic review tool, if applicable. No additional management support is needed unless otherwise documented below in the visit note. 

## 2015-10-26 NOTE — Progress Notes (Signed)
Subjective:    Patient ID: Maria Clarke, female    DOB: 10/06/1921, 82 y.o.   MRN: [id number]  HPI Maria Clarke 82 year old widowed female nonsmoker who comes in today for general physical examination  She takes Norvasc 5 mg daily along with Lopressor 50 mg twice a day for hypertension and PAT  She feels well except that her blood pressure days 120/70. She says on occasion she's lightheaded when she stands up. She's never passed out.  She takes vitamin B12 1 mL monthly because of a history of B12 deficiency  She takes Aricept 5 mg daily because of a history of mild cognitive impairment. She also takes Lexapro 5 mg bedtime  She takes Synthroid 100 g daily because of a history of hypothyroidism. We'll check her TSH level today  Overall she's had a good year except for a severe cellulitis of her arm when she had a cat bite. Fortunately that resolved with antibiotics  She gets routine eye care, dental care, no longer needs colonoscopy or mammography  Vaccinations up-to-date tetanus booster 2010  She continues to live alone and has a lot of help from her family when needed.  She has had bilateral cataracts and lens implants. Vision normal  Hearing deteriorated. They tried some hearing devices from [employer] and it didn't work. I will refer her to Tonia Ghent at [facility]  She's had a history of bilateral carotid bruits. Last sonogram showed a 60% lesion left 40% on the right. She is asymptomatic. She has appointment the end of April to go back and see the vascular folks however I don't think we need to pursue it at this juncture except to continue to keep her blood pressure normal and take an aspirin tablet daily   Review of Systems  Constitutional: Negative.   HENT: Negative.   Eyes: Negative.   Respiratory: Negative.   Cardiovascular: Negative.   Gastrointestinal: Negative.   Endocrine: Negative.   Genitourinary: Negative.   Musculoskeletal: Negative.   Skin: Negative.     Allergic/Immunologic: Negative.   Neurological: Negative.   Hematological: Negative.   Psychiatric/Behavioral: Negative.        Objective:   Physical Exam  Constitutional: She appears well-developed and well-nourished.  HENT:  Head: Normocephalic and atraumatic.  Right Ear: External ear normal.  Left Ear: External ear normal.  Nose: Nose normal.  Mouth/Throat: Oropharynx is clear and moist.  Right ear complete cerumen impaction left ear partial,,,,,,,,, irrigated by Maria Clarke  Eyes: EOM are normal. Pupils are equal, round, and reactive to light.  Neck: Normal range of motion. Neck supple. No JVD present. No tracheal deviation present. No thyromegaly present.  Cardiovascular: Normal rate, regular rhythm, normal heart sounds and intact distal pulses.  Exam reveals no gallop and no friction rub.   No murmur heard. Pulmonary/Chest: Effort normal and breath sounds normal. No stridor. No respiratory distress. She has no wheezes. She has no rales. She exhibits no tenderness.  Abdominal: Soft. Bowel sounds are normal. She exhibits no distension and no mass. There is no tenderness. There is no rebound and no guarding.  Musculoskeletal: Normal range of motion.  Lymphadenopathy:    She has no cervical adenopathy.  Neurological: She is alert. She has normal reflexes. No cranial nerve deficit. She exhibits normal muscle tone. Coordination normal.  Skin: Skin is warm and dry. No rash noted. No erythema. No pallor.  Psychiatric: She has a normal mood and affect. Her behavior is normal. Judgment and thought content normal.  Nursing note and vitals reviewed.         Assessment & Plan:  Hypertension at goal continue current therapy  Hypothyroidism at goal continue current therapy check TSH level  Mild cognitive impairment continue Aricept and Lexapro  History of vitamin B12 deficiency continue B12 1 mL monthly  Hearing loss............. consult with Nira Conn [facility] at [facility]  Bilateral carotid bruits clinically asymptomatic no audible change since last year

## 2015-10-26 NOTE — Patient Instructions (Addendum)
All your medications stay the same except for minimal decrease in Metaprel Paul to 1 tablet in the morning instead of 1 pill twice a day  Return in May 10 for follow-up on your blood pressure  Check your blood pressure daily in the morning  When you return bring a record of all your blood pressure readings and the device  Call Tonia Ghent at [facility] for consultation concerning your hearing loss warm and

## 2015-10-31 ENCOUNTER — Encounter: Payer: Self-pay | Admitting: Family

## 2015-10-31 ENCOUNTER — Ambulatory Visit (HOSPITAL_COMMUNITY)
Admission: RE | Admit: 2015-10-31 | Discharge: 2015-10-31 | Disposition: A | Discharge status: Home / Self Care | Payer: Medicare Other | Source: Ambulatory Visit | Attending: Family | Admitting: Family

## 2015-10-31 ENCOUNTER — Other Ambulatory Visit: Payer: Self-pay | Admitting: Family

## 2015-10-31 ENCOUNTER — Ambulatory Visit (INDEPENDENT_AMBULATORY_CARE_PROVIDER_SITE_OTHER): Payer: Medicare Other | Admitting: Family

## 2015-10-31 VITALS — BP 155/66 | HR 60 | Temp 97.1°F | Resp 16 | Ht 60.0 in | Wt 123.0 lb

## 2015-10-31 DIAGNOSIS — I6523 Occlusion and stenosis of bilateral carotid arteries: Secondary | ICD-10-CM

## 2015-10-31 DIAGNOSIS — E7800 Pure hypercholesterolemia, unspecified: Secondary | ICD-10-CM | POA: Insufficient documentation

## 2015-10-31 DIAGNOSIS — I10 Essential (primary) hypertension: Secondary | ICD-10-CM | POA: Insufficient documentation

## 2015-10-31 NOTE — Progress Notes (Signed)
Chief Complaint: Extracranial Carotid Artery Stenosis   History of Present Illness  Maria Clarke is a 82 y.o. female patient of Dr. Trula Slade who is here today for followup of her carotid stenosis. Her carotid disease was initially detected by bruit.   Patient has not had previous carotid artery intervention. Patient reports that she has never had a stroke or TIA, specifically she denies any history of amaurosis fugax or monocular blindness, denies a history of unilateral facial drooping, denies a history of hemiplegia, denies a history of receptive or expressive aphasia.   Pt states she checks her blood pressure daily at home, states [id number] systolic.   She takes 2 Advil daily since 2015 for arthritis pain.  Patient denies claudication symptoms with walking.  Pt denies New Medical or Surgical History.  Pt Diabetic: No Pt smoker: non-smoker  Pt meds include: Statin : No: Lipitor was stopped by her PCP due to myalgias ASA: Yes Other anticoagulants/antiplatelets: no   Past Medical History  Diagnosis Date  . Hypothyroidism   . Depression   . Carotid artery disease (HCC)     Carotid duplex showed [id number] RICA and [id number] LICA  . Hypertension   . Hypercholesteremia   . Atrial tachycardia ([place])     a. Noted 02/2013 on EKG.    Social History Social History  Substance Use Topics  . Smoking status: Never Smoker   . Smokeless tobacco: Never Used  . Alcohol Use: No    Family History Family History  Problem Relation Age of Onset  . Diabetes Mother   . Heart disease Mother     After age 19  . Hyperlipidemia Mother   . Hypertension Mother   . Heart disease Sister     After age 82  . Asthma Sister   . Hyperlipidemia Sister   . Heart disease Brother     After age 8  . Diabetes Son   . Hypertension Son     Surgical History Past Surgical History  Procedure Laterality Date  . Breast surgery Left     Benign Tumor- several years ago  . Cesarean section    . Eye  surgery Bilateral     cataract    Allergies  Allergen Reactions  . Sulfonamide Derivatives Other (See Comments)    REACTION: ORAL S.E.    Current Outpatient Prescriptions  Medication Sig Dispense Refill  . acyclovir (ZOVIRAX) 400 MG tablet Take 1 tablet (400 mg total) by mouth 5 (five) times daily. 50 tablet 1  . amLODipine (NORVASC) 5 MG tablet Take 1 tablet (5 mg total) by mouth daily. 90 tablet 4  . aspirin 81 MG tablet Take 81 mg by mouth daily.    . cyanocobalamin (,VITAMIN B-12,) 1000 MCG/ML injection Inject 1 mL (1,000 mcg total) into the skin once. 30 mL 5  . donepezil (ARICEPT) 5 MG tablet Take 1 tablet (5 mg total) by mouth at bedtime. 90 tablet 4  . escitalopram (LEXAPRO) 5 MG tablet Take 1 tablet (5 mg total) by mouth daily. 90 tablet 4  . Hydrocodone-Acetaminophen 5-300 MG TABS One half tablet every 6-8 hours as needed for severe pain 30 each 0  . levothyroxine (SYNTHROID, LEVOTHROID) 100 MCG tablet Take 1 tablet (100 mcg total) by mouth daily before breakfast. 100 tablet 4  . metoprolol (LOPRESSOR) 50 MG tablet 1 daily in the morning 100 tablet 5   No current facility-administered medications for this visit.    Review of Systems : See HPI for  pertinent positives and negatives.  Physical Examination  Filed Vitals:   10/31/15 1145 10/31/15 1148  BP: 153/71 155/66  Pulse: 58 60  Temp:  97.1 F (36.2 C)  TempSrc:  Oral  Resp: 14 16  Height: 5' (1.524 m)   Weight: 123 lb (55.792 kg)   SpO2: 99%    Body mass index is 24.02 kg/(m^2).  General: WDWN female in NAD, daughter in law with patient. GAIT: normal Eyes: PERRLA Pulmonary: Non-labored, CTAB, Negative Rales, Negative rhonchi, & Negative wheezing.  Cardiac: regular Rhythm, + murmur.  VASCULAR EXAM Carotid Bruits Right Left   Positive, possibly transmitted cardiac murmur Negative   Aorta is not palpable. Radial pulses are 1+ right, 2+ left, palpable.       LE Pulses Right Left   POPLITEAL not palpable  not palpable   POSTERIOR TIBIAL 2+ palpable  2+ palpable    DORSALIS PEDIS  ANTERIOR TIBIAL 2+ palpable  2+ palpable     Gastrointestinal: soft, nontender, BS WNL, no r/g, no palpable masses palpated.  Musculoskeletal: Negative muscle atrophy/wasting. M/S 5/5 throughout, Extremities without ischemic changes.  Neurologic: A&O X 3; Appropriate Affect, Speech is normal CN 2-12 intact, Pain and light touch intact in extremities, Motor exam as listed above.                Non-Invasive Vascular Imaging CAROTID DUPLEX 10/31/2015   Right ICA: 40 - 59 % stenosis. Left ICA: <40% stenosis. Bilateral vertebral artery is antegrade. No significant change in a year   Assessment: Maria Clarke is a 82 y.o. female who has no history of stroke or TIA. Today's carotid duplex suggests 40-59% right ICA stenosis and <40% left ICA stenosis; unchanged from 10/25/14 and 04/20/2014 exams.   Plan: Follow-up in 1 year with Carotid Duplex scan.   I discussed in depth with the patient the nature of atherosclerosis, and emphasized the importance of maximal medical management including strict control of blood pressure, blood glucose, and lipid levels, obtaining regular exercise, and continued cessation of smoking.  The patient is aware that without maximal medical management the underlying atherosclerotic disease process will progress, limiting the benefit of any interventions. The patient was given information about stroke prevention and what symptoms should prompt the patient to seek immediate medical care. Thank you for allowing [place] to participate in this patient's care.  Clemon Chambers, RN, MSN, FNP-C [facility] [place] Office: [facility] Physician:  Trula Slade  10/31/2015 12:10 PM

## 2015-10-31 NOTE — Patient Instructions (Signed)
Stroke Prevention Some medical conditions and behaviors are associated with an increased chance of having a stroke. You may prevent a stroke by making healthy choices and managing medical conditions. HOW CAN I REDUCE MY RISK OF HAVING A STROKE?   Stay physically active. Get at least 30 minutes of activity on most or all days.  Do not smoke. It may also be helpful to avoid exposure to secondhand smoke.  Limit alcohol use. Moderate alcohol use is considered to be:  No more than 2 drinks per day for men.  No more than 1 drink per day for nonpregnant women.  Eat healthy foods. This involves:  Eating 5 or more servings of fruits and vegetables a day.  Making dietary changes that address high blood pressure (hypertension), high cholesterol, diabetes, or obesity.  Manage your cholesterol levels.  Making food choices that are high in fiber and low in saturated fat, trans fat, and cholesterol may control cholesterol levels.  Take any prescribed medicines to control cholesterol as directed by your health care provider.  Manage your diabetes.  Controlling your carbohydrate and sugar intake is recommended to manage diabetes.  Take any prescribed medicines to control diabetes as directed by your health care provider.  Control your hypertension.  Making food choices that are low in salt (sodium), saturated fat, trans fat, and cholesterol is recommended to manage hypertension.  Ask your health care provider if you need treatment to lower your blood pressure. Take any prescribed medicines to control hypertension as directed by your health care provider.  If you are 18-39 years of age, have your blood pressure checked every 3-5 years. If you are 40 years of age or older, have your blood pressure checked every year.  Maintain a healthy weight.  Reducing calorie intake and making food choices that are low in sodium, saturated fat, trans fat, and cholesterol are recommended to manage  weight.  Stop drug abuse.  Avoid taking birth control pills.  Talk to your health care provider about the risks of taking birth control pills if you are over 35 years old, smoke, get migraines, or have ever had a blood clot.  Get evaluated for sleep disorders (sleep apnea).  Talk to your health care provider about getting a sleep evaluation if you snore a lot or have excessive sleepiness.  Take medicines only as directed by your health care provider.  For some people, aspirin or blood thinners (anticoagulants) are helpful in reducing the risk of forming abnormal blood clots that can lead to stroke. If you have the irregular heart rhythm of atrial fibrillation, you should be on a blood thinner unless there is a good reason you cannot take them.  Understand all your medicine instructions.  Make sure that other conditions (such as anemia or atherosclerosis) are addressed. SEEK IMMEDIATE MEDICAL CARE IF:   You have sudden weakness or numbness of the face, arm, or leg, especially on one side of the body.  Your face or eyelid droops to one side.  You have sudden confusion.  You have trouble speaking (aphasia) or understanding.  You have sudden trouble seeing in one or both eyes.  You have sudden trouble walking.  You have dizziness.  You have a loss of balance or coordination.  You have a sudden, severe headache with no known cause.  You have new chest pain or an irregular heartbeat. Any of these symptoms may represent a serious problem that is an emergency. Do not wait to see if the symptoms will   go away. Get medical help at once. Call your local emergency services (911 in [place] Do not drive yourself to the hospital.   This information is not intended to replace advice given to you by your health care provider. Make sure you discuss any questions you have with your health care provider.   Document Released: 08/02/2004 Document Revised: 07/16/2014 Document Reviewed:  12/26/2012 Elsevier Interactive Patient Education 2016 Elsevier Inc.  

## 2015-11-02 DIAGNOSIS — H903 Sensorineural hearing loss, bilateral: Secondary | ICD-10-CM | POA: Diagnosis not present

## 2015-11-16 ENCOUNTER — Telehealth: Payer: Self-pay | Admitting: Family Medicine

## 2015-11-16 ENCOUNTER — Encounter: Payer: Self-pay | Admitting: Family Medicine

## 2015-11-16 ENCOUNTER — Ambulatory Visit (INDEPENDENT_AMBULATORY_CARE_PROVIDER_SITE_OTHER): Payer: Medicare Other | Admitting: Family Medicine

## 2015-11-16 VITALS — Temp 97.8°F | Wt 122.0 lb

## 2015-11-16 DIAGNOSIS — I10 Essential (primary) hypertension: Secondary | ICD-10-CM

## 2015-11-16 DIAGNOSIS — I6523 Occlusion and stenosis of bilateral carotid arteries: Secondary | ICD-10-CM

## 2015-11-16 MED ORDER — METOPROLOL TARTRATE 25 MG PO TABS: ORAL_TABLET | ORAL | Status: DC

## 2015-11-16 NOTE — Progress Notes (Signed)
   Subjective:    Patient ID: Maria Clarke, female    DOB: 1921/10/01, 82 y.o.   MRN: [id number]  HPI Maria Clarke is a 82 year old female widowed nonsmoker comes in today for follow-up of hypertension  She's on Norvasc 5 mg and Lopressor 50 mg twice a day. However blood pressure dropped too low when she was lightheaded when she stood up. We therefore stopped her evening dose of Lopressor. Now her blood pressures gone up to 170/70 review of systems negative asymptomatic   Review of Systems     Objective:   Physical Exam  Well-developed well-nourished female no acute distress vital signs stable she's afebrile BP right arm sitting position 170/70      Assessment & Plan:  Hypertension not at goal................. 25 mg of Lopressor at bedtime continue the 50 mg dose in the morning BP check daily follow-up in one month.

## 2015-11-16 NOTE — Patient Instructions (Signed)
Continue the Norvasc and Lopressor....... one of each in the morning  Lopressor 25 mg.........Marland Kitchen 1 at bedtime  Blood pressure daily in the morning............ follow-up in 4 weeks

## 2015-11-16 NOTE — Progress Notes (Signed)
Pre visit review using our clinic review tool, if applicable. No additional management support is needed unless otherwise documented below in the visit note. 

## 2015-11-16 NOTE — Telephone Encounter (Signed)
Pharmacy wanted to let you know that pt was getting 5 mg not 50 mg of donepezil (ARICEPT).  Need new Rx for the 5 mg being cut in 1/2 due to the pts blood pressure being low.

## 2015-11-17 NOTE — Telephone Encounter (Signed)
Spoke with pharmacy and son for clarification.

## 2015-12-02 NOTE — Addendum Note (Signed)
Addended by: Mena Goes on: 12/02/2015 02:04 PM   Modules accepted: Orders

## 2016-01-03 DIAGNOSIS — H43813 Vitreous degeneration, bilateral: Secondary | ICD-10-CM | POA: Diagnosis not present

## 2016-01-03 DIAGNOSIS — H524 Presbyopia: Secondary | ICD-10-CM | POA: Diagnosis not present

## 2016-01-03 DIAGNOSIS — H26493 Other secondary cataract, bilateral: Secondary | ICD-10-CM | POA: Diagnosis not present

## 2016-01-03 DIAGNOSIS — H52223 Regular astigmatism, bilateral: Secondary | ICD-10-CM | POA: Diagnosis not present

## 2016-02-16 DIAGNOSIS — H524 Presbyopia: Secondary | ICD-10-CM | POA: Diagnosis not present

## 2016-02-16 DIAGNOSIS — H26492 Other secondary cataract, left eye: Secondary | ICD-10-CM | POA: Diagnosis not present

## 2016-02-16 DIAGNOSIS — H43813 Vitreous degeneration, bilateral: Secondary | ICD-10-CM | POA: Diagnosis not present

## 2016-02-16 DIAGNOSIS — H52223 Regular astigmatism, bilateral: Secondary | ICD-10-CM | POA: Diagnosis not present

## 2016-03-29 DIAGNOSIS — H52223 Regular astigmatism, bilateral: Secondary | ICD-10-CM | POA: Diagnosis not present

## 2016-03-29 DIAGNOSIS — H43813 Vitreous degeneration, bilateral: Secondary | ICD-10-CM | POA: Diagnosis not present

## 2016-03-29 DIAGNOSIS — H26491 Other secondary cataract, right eye: Secondary | ICD-10-CM | POA: Diagnosis not present

## 2016-03-29 DIAGNOSIS — H524 Presbyopia: Secondary | ICD-10-CM | POA: Diagnosis not present

## 2016-04-20 IMAGING — DX DG FOREARM 2V*L*
2 series · 2 of 2 positions shown · non-contrast
Comparison: None.

CLINICAL DATA: Left arm swelling, redness and fever following a cat
bite 14 days ago.

EXAM:
LEFT FOREARM - 2 VIEW

[forearm ap]
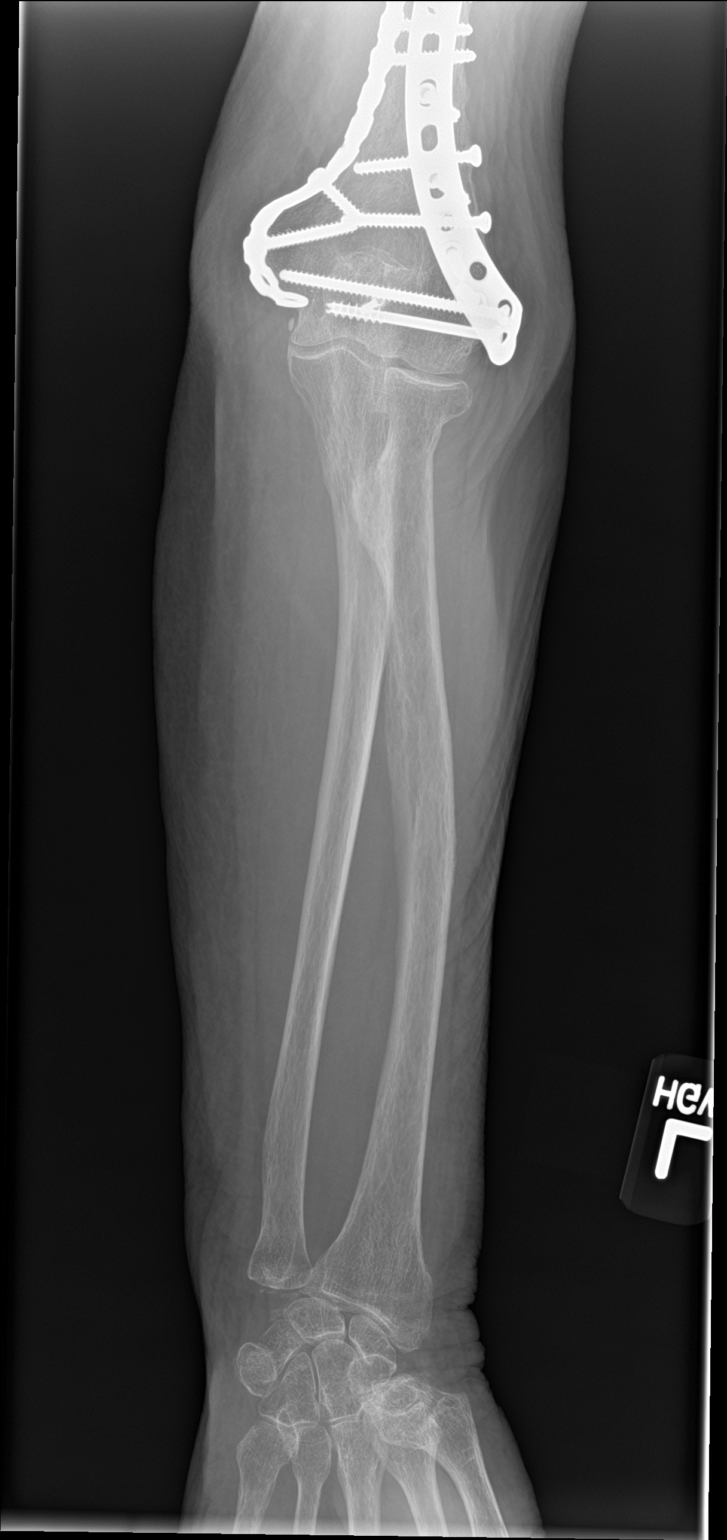

[forearm lat]
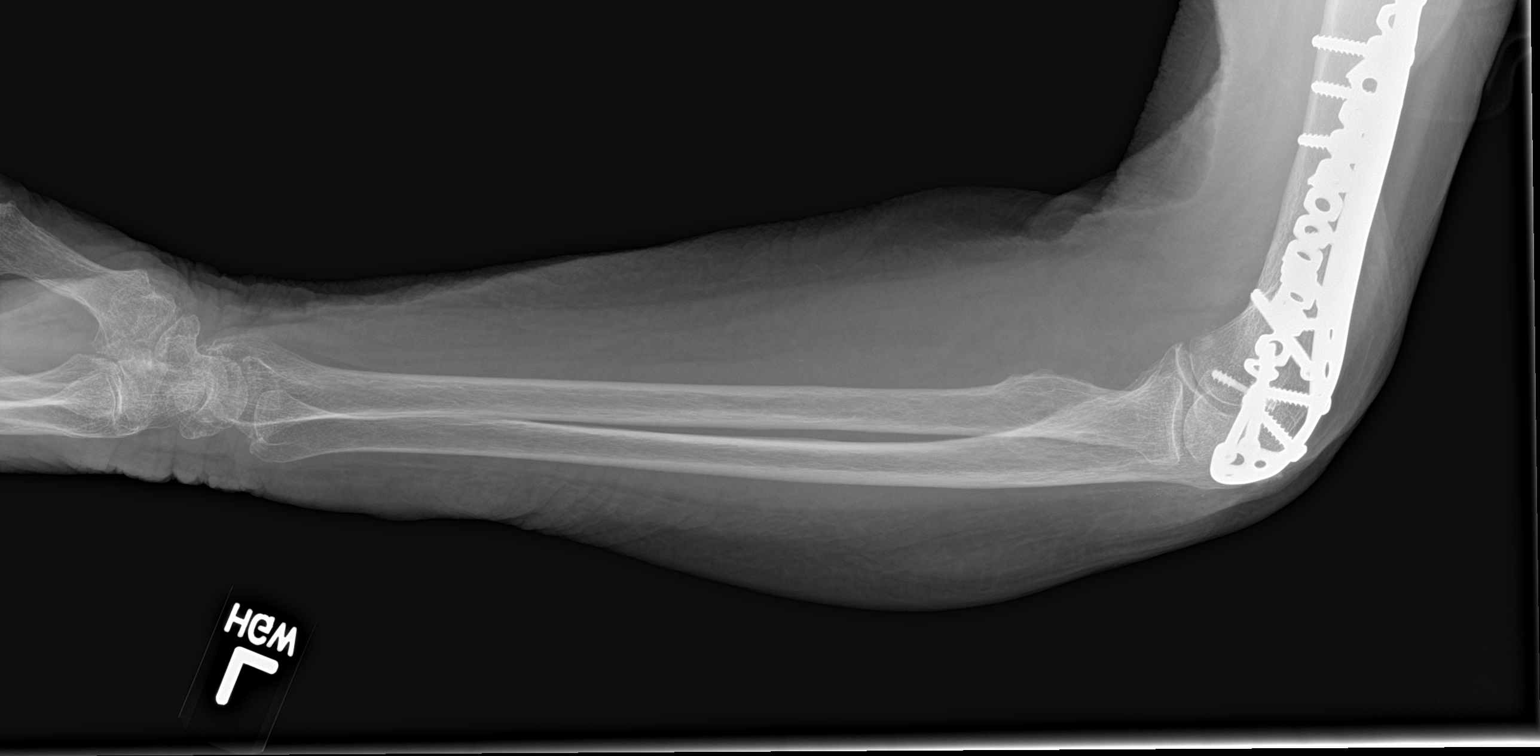

[2 of 2 positions shown; findings below may reference images not displayed]

FINDINGS: Diffuse soft tissue swelling. No bone destruction or periosteal
reaction. No soft tissue gas. Distal humeral fixation hardware.
IMPRESSION: Soft tissue swelling without underlying changes of osteomyelitis.

## 2016-06-14 ENCOUNTER — Other Ambulatory Visit: Payer: Self-pay

## 2016-07-17 ENCOUNTER — Other Ambulatory Visit: Payer: Self-pay | Admitting: Emergency Medicine

## 2016-07-19 ENCOUNTER — Other Ambulatory Visit: Payer: Self-pay | Admitting: Family Medicine

## 2016-07-19 DIAGNOSIS — I10 Essential (primary) hypertension: Secondary | ICD-10-CM

## 2016-07-19 MED ORDER — AMLODIPINE BESYLATE 5 MG PO TABS: 5.0000 mg | ORAL_TABLET | Freq: Every day | ORAL | 4 refills | Status: DC

## 2016-10-10 ENCOUNTER — Other Ambulatory Visit: Payer: Self-pay | Admitting: Family Medicine

## 2016-10-10 ENCOUNTER — Telehealth: Payer: Self-pay | Admitting: Family Medicine

## 2016-10-10 DIAGNOSIS — N912 Amenorrhea, unspecified: Secondary | ICD-10-CM

## 2016-10-10 MED ORDER — CYANOCOBALAMIN 1000 MCG/ML IJ SOLN
1000.0000 ug | INTRAMUSCULAR | 0 refills | Status: DC
Start: 2016-10-10 — End: 2016-12-05

## 2016-10-10 NOTE — Telephone Encounter (Signed)
Pt is due for appt with Sherren Mocha.  Please help the pt to make that appt.  Yearly visit please.

## 2016-10-10 NOTE — Telephone Encounter (Signed)
Pt has been sch

## 2016-10-26 ENCOUNTER — Encounter: Payer: Self-pay | Admitting: Family

## 2016-10-30 ENCOUNTER — Other Ambulatory Visit: Payer: Self-pay | Admitting: Family Medicine

## 2016-10-30 DIAGNOSIS — F339 Major depressive disorder, recurrent, unspecified: Secondary | ICD-10-CM

## 2016-10-30 MED ORDER — ESCITALOPRAM OXALATE 5 MG PO TABS
5.0000 mg | ORAL_TABLET | Freq: Every day | ORAL | 0 refills | Status: DC
Start: 2016-10-30 — End: 2016-12-05

## 2016-10-30 MED ORDER — DONEPEZIL HCL 5 MG PO TABS: 5.0000 mg | ORAL_TABLET | Freq: Every day | ORAL | 0 refills | Status: DC

## 2016-10-30 NOTE — Telephone Encounter (Signed)
Sent to the pharmacy for 90 days.  Pt has upcoming yearly on 12/04/16

## 2016-11-05 ENCOUNTER — Ambulatory Visit (INDEPENDENT_AMBULATORY_CARE_PROVIDER_SITE_OTHER): Payer: Medicare Other | Admitting: Family

## 2016-11-05 ENCOUNTER — Encounter: Payer: Self-pay | Admitting: Family

## 2016-11-05 ENCOUNTER — Ambulatory Visit (HOSPITAL_COMMUNITY)
Admission: RE | Admit: 2016-11-05 | Discharge: 2016-11-05 | Disposition: A | Discharge status: Home / Self Care | Payer: Medicare Other | Source: Ambulatory Visit | Attending: Family | Admitting: Family

## 2016-11-05 VITALS — BP 166/72 | HR 57 | Ht 61.5 in | Wt 127.6 lb

## 2016-11-05 DIAGNOSIS — I6523 Occlusion and stenosis of bilateral carotid arteries: Secondary | ICD-10-CM

## 2016-11-05 LAB — VAS US CAROTID
LCCADDIAS: -13 cm/s
LCCADSYS: -74 cm/s
LCCAPSYS: 99 cm/s
LEFT ECA DIAS: -2 cm/s
LEFT VERTEBRAL DIAS: 9 cm/s
LICADDIAS: -27 cm/s
LICAPSYS: -114 cm/s
Left CCA prox dias: 15 cm/s
Left ICA dist sys: -101 cm/s
Left ICA prox dias: -28 cm/s
RIGHT CCA MID DIAS: -10 cm/s
RIGHT ECA DIAS: -7 cm/s
RIGHT VERTEBRAL DIAS: -14 cm/s
Right CCA prox dias: 10 cm/s
Right CCA prox sys: 74 cm/s
Right cca dist sys: -95 cm/s

## 2016-11-05 NOTE — Progress Notes (Signed)
Chief Complaint: Follow up Extracranial Carotid Artery Stenosis   History of Present Illness  Maria Clarke is a 83 y.o. female patient of Dr. Trula Slade who is here today for followup of her carotid stenosis. Her carotid disease was initially detected by bruit.   Patient has not had previous carotid artery intervention. Patient reports that she has never had a stroke or TIA, specifically she denies any history of amaurosis fugax or monocular blindness,  unilateral facial drooping, hemiplegia, or receptive or expressive aphasia.   She checks her blood pressure daily at home, states 782-956'O systolic.   She takes 2 Advil daily since 2015 for arthritis pain.  She denies claudication symptoms with walking.  Pt's daughter states pt's blood pressure at home runs about 130-865 systolic. Pt states her blood pressure increases in a medical setting.   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   Pt Diabetic: No Pt smoker: non-smoker  Pt meds include: Statin : No: Lipitor was stopped by her PCP due to myalgias ASA: Yes Other anticoagulants/antiplatelets: no      Past Medical History:  Diagnosis Date  . Atrial tachycardia ([place])    a. Noted 02/2013 on EKG.  . Carotid artery disease ([place])    Carotid duplex showed 78-46% RICA and 9-62% LICA  . Depression   . Hypercholesteremia   . Hypertension   . Hypothyroidism     Social History Social History  Substance Use Topics  . Smoking status: Never Smoker  . Smokeless tobacco: Never Used  . Alcohol use No    Family History Family History  Problem Relation Age of Onset  . Diabetes Mother   . Heart disease Mother     After age 36  . Hyperlipidemia Mother   . Hypertension Mother   . Heart disease Sister     After age 30  . Asthma Sister   . Hyperlipidemia Sister   . Heart disease Brother     After age 52  . Diabetes Son   . Hypertension Son     Surgical History Past Surgical History:  Procedure  Laterality Date  . BREAST SURGERY Left    Benign Tumor- several years ago  . CESAREAN SECTION    . EYE SURGERY Bilateral    cataract    Allergies  Allergen Reactions  . Sulfonamide Derivatives Other (See Comments)    REACTION: ORAL S.E.    Current Outpatient Prescriptions  Medication Sig Dispense Refill  . amLODipine (NORVASC) 5 MG tablet Take 1 tablet (5 mg total) by mouth daily. 90 tablet 4  . aspirin 81 MG tablet Take 81 mg by mouth daily.    . cyanocobalamin (,VITAMIN B-12,) 1000 MCG/ML injection Inject 1 mL (1,000 mcg total) into the skin every 30 (thirty) days. 10 mL 0  . donepezil (ARICEPT) 5 MG tablet Take 1 tablet (5 mg total) by mouth at bedtime. 90 tablet 0  . escitalopram (LEXAPRO) 5 MG tablet Take 1 tablet (5 mg total) by mouth daily. 90 tablet 0  . levothyroxine (SYNTHROID, LEVOTHROID) 100 MCG tablet Take 1 tablet (100 mcg total) by mouth daily before breakfast. 100 tablet 4  . metoprolol (LOPRESSOR) 50 MG tablet 1 daily in the morning 100 tablet 5  . metoprolol tartrate (LOPRESSOR) 25 MG tablet 1 tablet daily at bedtime 100 tablet 3   No current facility-administered medications for this visit.     Review of Systems : See HPI for pertinent positives and negatives.  Physical Examination  Vitals:   11/05/16 1151 11/05/16 1154  BP: (!) 173/70 (!) 166/72  Pulse: (!) 58 (!) 57  SpO2: 97%   Weight: 127 lb 9.6 oz (57.9 kg)   Height: 5' 1.5" (1.562 m)    Body mass index is 23.72 kg/m.  General: WDWN female in NAD, daughter in law with patient. GAIT: normal Eyes: PERRLA Pulmonary: Non-labored, CTAB, Negative Rales, Negative rhonchi, & Negative wheezing.  Cardiac: regular rhythm, + murmur.  VASCULAR EXAM Carotid Bruits Right Left   Positive, possibly transmitted cardiac murmur Negative   Aorta is not palpable. Radial pulses are 1+ right, 2+ left, palpable.       LE Pulses Right Left   POPLITEAL not palpable  not palpable   POSTERIOR TIBIAL 2+ palpable  2+ palpable    DORSALIS PEDIS  ANTERIOR TIBIAL 2+ palpable  2+ palpable     Gastrointestinal: soft, nontender, BS WNL, no r/g, no palpable masses palpated.  Musculoskeletal: No muscle atrophy/wasting. M/S 5/5 throughout, Extremities without ischemic changes.  Neurologic: A&O X 3; Appropriate Affect, Speech is normal CN 2-12 intact, Pain and light touch intact in extremities, Motor exam as listed above     Assessment: Maria Clarke is a 83 y.o. female who has no history of stroke or TIA. Fortunately she does not have DM and has never used tobacco.  She is statin intolerant.  She takes a daily ASA.   DATA  Today's carotid duplex suggests 40-59% vs 60-79% right ICA stenosis and <40% left ICA stenosis. >50% stenosis of the right ECA. Bilateral vertebral artery flow is antegrade.  Bilateral subclavian artery waveforms are normal.  Slight increased stenosis of the right ICA compared to a year ago.    Plan: Follow-up in 6 months with Carotid Duplex scan.   I discussed in depth with the patient the nature of atherosclerosis, and emphasized the importance of maximal medical management including strict control of blood pressure, blood glucose, and lipid levels, obtaining regular exercise, and continued cessation of smoking.  The patient is aware that without maximal medical management the underlying atherosclerotic disease process will progress, limiting the benefit of any interventions. The patient was given information about stroke prevention and what symptoms should prompt the patient to seek immediate medical care. Thank you for allowing [place] to participate in this patient's care.  Clemon Chambers, RN, MSN, FNP-C [facility] [place] Office: [facility] Physician: Trula Slade  11/05/16 12:53 PM

## 2016-11-05 NOTE — Patient Instructions (Signed)
Stroke Prevention Some medical conditions and behaviors are associated with an increased chance of having a stroke. You may prevent a stroke by making healthy choices and managing medical conditions. How can I reduce my risk of having a stroke?  Stay physically active. Get at least 30 minutes of activity on most or all days.  Do not smoke. It may also be helpful to avoid exposure to secondhand smoke.  Limit alcohol use. Moderate alcohol use is considered to be:  No more than 2 drinks per day for men.  No more than 1 drink per day for nonpregnant women.  Eat healthy foods. This involves:  Eating 5 or more servings of fruits and vegetables a day.  Making dietary changes that address high blood pressure (hypertension), high cholesterol, diabetes, or obesity.  Manage your cholesterol levels.  Making food choices that are high in fiber and low in saturated fat, trans fat, and cholesterol may control cholesterol levels.  Take any prescribed medicines to control cholesterol as directed by your health care provider.  Manage your diabetes.  Controlling your carbohydrate and sugar intake is recommended to manage diabetes.  Take any prescribed medicines to control diabetes as directed by your health care provider.  Control your hypertension.  Making food choices that are low in salt (sodium), saturated fat, trans fat, and cholesterol is recommended to manage hypertension.  Ask your health care provider if you need treatment to lower your blood pressure. Take any prescribed medicines to control hypertension as directed by your health care provider.  If you are 18-39 years of age, have your blood pressure checked every 3-5 years. If you are 40 years of age or older, have your blood pressure checked every year.  Maintain a healthy weight.  Reducing calorie intake and making food choices that are low in sodium, saturated fat, trans fat, and cholesterol are recommended to manage  weight.  Stop drug abuse.  Avoid taking birth control pills.  Talk to your health care provider about the risks of taking birth control pills if you are over 35 years old, smoke, get migraines, or have ever had a blood clot.  Get evaluated for sleep disorders (sleep apnea).  Talk to your health care provider about getting a sleep evaluation if you snore a lot or have excessive sleepiness.  Take medicines only as directed by your health care provider.  For some people, aspirin or blood thinners (anticoagulants) are helpful in reducing the risk of forming abnormal blood clots that can lead to stroke. If you have the irregular heart rhythm of atrial fibrillation, you should be on a blood thinner unless there is a good reason you cannot take them.  Understand all your medicine instructions.  Make sure that other conditions (such as anemia or atherosclerosis) are addressed. Get help right away if:  You have sudden weakness or numbness of the face, arm, or leg, especially on one side of the body.  Your face or eyelid droops to one side.  You have sudden confusion.  You have trouble speaking (aphasia) or understanding.  You have sudden trouble seeing in one or both eyes.  You have sudden trouble walking.  You have dizziness.  You have a loss of balance or coordination.  You have a sudden, severe headache with no known cause.  You have new chest pain or an irregular heartbeat. Any of these symptoms may represent a serious problem that is an emergency. Do not wait to see if the symptoms will go away.   Get medical help at once. Call your local emergency services (911 in [place] Do not drive yourself to the hospital. This information is not intended to replace advice given to you by your health care provider. Make sure you discuss any questions you have with your health care provider. Document Released: 08/02/2004 Document Revised: 12/01/2015 Document Reviewed: 12/26/2012 Elsevier  Interactive Patient Education  2017 Elsevier Inc.  

## 2016-11-06 NOTE — Addendum Note (Signed)
Addended by: Lianne Cure A on: 11/06/2016 09:51 AM   Modules accepted: Orders

## 2016-12-04 ENCOUNTER — Ambulatory Visit: Payer: Medicare Other | Admitting: Family Medicine

## 2016-12-05 ENCOUNTER — Encounter: Payer: Self-pay | Admitting: Family Medicine

## 2016-12-05 ENCOUNTER — Ambulatory Visit (INDEPENDENT_AMBULATORY_CARE_PROVIDER_SITE_OTHER): Payer: Medicare Other | Admitting: Family Medicine

## 2016-12-05 VITALS — BP 166/70 | Temp 97.7°F | Ht 60.0 in | Wt 125.0 lb

## 2016-12-05 DIAGNOSIS — H903 Sensorineural hearing loss, bilateral: Secondary | ICD-10-CM

## 2016-12-05 DIAGNOSIS — E039 Hypothyroidism, unspecified: Secondary | ICD-10-CM

## 2016-12-05 DIAGNOSIS — F32A Depression, unspecified: Secondary | ICD-10-CM

## 2016-12-05 DIAGNOSIS — Z136 Encounter for screening for cardiovascular disorders: Secondary | ICD-10-CM | POA: Diagnosis not present

## 2016-12-05 DIAGNOSIS — F329 Major depressive disorder, single episode, unspecified: Secondary | ICD-10-CM | POA: Diagnosis not present

## 2016-12-05 DIAGNOSIS — E038 Other specified hypothyroidism: Secondary | ICD-10-CM

## 2016-12-05 DIAGNOSIS — R413 Other amnesia: Secondary | ICD-10-CM

## 2016-12-05 DIAGNOSIS — I6523 Occlusion and stenosis of bilateral carotid arteries: Secondary | ICD-10-CM

## 2016-12-05 DIAGNOSIS — N912 Amenorrhea, unspecified: Secondary | ICD-10-CM

## 2016-12-05 DIAGNOSIS — I10 Essential (primary) hypertension: Secondary | ICD-10-CM

## 2016-12-05 LAB — POCT URINALYSIS DIPSTICK
Bilirubin, UA: NEGATIVE
Glucose, UA: NEGATIVE
Ketones, UA: NEGATIVE
LEUKOCYTES UA: NEGATIVE
Nitrite, UA: NEGATIVE
PROTEIN UA: NEGATIVE
Spec Grav, UA: >=1.03 — AB (ref 1.010–1.025)
UROBILINOGEN UA: 0.2 U/dL
pH, UA: 6 (ref 5.0–8.0)

## 2016-12-05 LAB — CBC WITH DIFFERENTIAL/PLATELET
Basophils Absolute: 0 10*3/uL (ref 0.0–0.1)
Basophils Relative: 0.5 % (ref 0.0–3.0)
EOS ABS: 0.2 10*3/uL (ref 0.0–0.7)
EOS PCT: 2.2 % (ref 0.0–5.0)
HCT: 43.8 % (ref 36.0–46.0)
HEMOGLOBIN: 15 g/dL (ref 12.0–15.0)
Lymphocytes Relative: 28.6 % (ref 12.0–46.0)
Lymphs Abs: 2 10*3/uL (ref 0.7–4.0)
MCHC: 34.2 g/dL (ref 30.0–36.0)
MCV: 88.8 fl (ref 78.0–100.0)
Monocytes Absolute: 0.4 10*3/uL (ref 0.1–1.0)
Monocytes Relative: 6.4 % (ref 3.0–12.0)
Neutro Abs: 4.3 10*3/uL (ref 1.4–7.7)
Neutrophils Relative %: 62.3 % (ref 43.0–77.0)
Platelets: 154 10*3/uL (ref 150.0–400.0)
RBC: 4.94 Mil/uL (ref 3.87–5.11)
RDW: 14.6 % (ref 11.5–15.5)
WBC: 7 10*3/uL (ref 4.0–10.5)

## 2016-12-05 LAB — BASIC METABOLIC PANEL
BUN: 16 mg/dL (ref 6–23)
CHLORIDE: 107 meq/L (ref 96–112)
CO2: 27 mEq/L (ref 19–32)
Calcium: 10 mg/dL (ref 8.4–10.5)
Creatinine, Ser: 0.73 mg/dL (ref 0.40–1.20)
GFR: 79.06 mL/min (ref >60.00)
Glucose, Bld: 98 mg/dL (ref 70–99)
POTASSIUM: 4 meq/L (ref 3.5–5.1)
Sodium: 142 mEq/L (ref 135–145)

## 2016-12-05 LAB — TSH: TSH: 0.02 u[IU]/mL — AB (ref 0.35–4.50)

## 2016-12-05 MED ORDER — ESCITALOPRAM OXALATE 5 MG PO TABS
5.0000 mg | ORAL_TABLET | Freq: Every day | ORAL | 4 refills | Status: DC
Start: 2016-12-05 — End: 2018-02-07

## 2016-12-05 MED ORDER — DONEPEZIL HCL 5 MG PO TABS: 5.0000 mg | ORAL_TABLET | Freq: Every day | ORAL | 4 refills | Status: DC

## 2016-12-05 MED ORDER — METOPROLOL TARTRATE 50 MG PO TABS: ORAL_TABLET | ORAL | 5 refills | Status: DC

## 2016-12-05 MED ORDER — AMLODIPINE BESYLATE 5 MG PO TABS: 5.0000 mg | ORAL_TABLET | Freq: Every day | ORAL | 4 refills | Status: DC

## 2016-12-05 MED ORDER — CYANOCOBALAMIN 1000 MCG/ML IJ SOLN: 1000.0000 ug | INTRAMUSCULAR | 3 refills | Status: DC

## 2016-12-05 MED ORDER — LEVOTHYROXINE SODIUM 100 MCG PO TABS: 100.0000 ug | ORAL_TABLET | Freq: Every day | ORAL | 4 refills | Status: DC

## 2016-12-05 NOTE — Patient Instructions (Signed)
Lopressor 50 mg................. one tablet twice daily  Continue your other medications  I would strongly recommend reconsult eating with Tonia Ghent at [facility] nose and [facility] about your hearing loss  Follow-up blood pressure check in 3 weeks

## 2016-12-05 NOTE — Progress Notes (Signed)
Maria Clarke's a 83 year old widowed female nonsmoker who comes in today for evaluation of hypertension and hearing loss  She takes Norvasc 5 mg daily along with Lopressor 50 mg the morning and 25 mg at bedtime for hypertension. She says she takes her medication daily. However her blood pressure today is 190/80 right arm sitting position. She's checking her blood pressures at home which are reported to be normal. I suspect her machine is not accurate  Her hearing is gotten worse. We had a good [facility] before but she's declined to try the hearing aids. I wonder if we are not to try this again.  Vaccinations up-to-date  She also takes Aricept 5 mg daily for mild cognitive impairment. She takes Lexapro 5 mg at bedtime for slight sleep and depression  She takes Synthroid 100 g daily for hypothyroidism  She takes B12 1 mL monthly because of a history of B12 deficiency  Social history she is widowed she lives alone her son [name] bloats her pillbox weekly. She comes in today accompanied by her daughter-in-law [name]  14 point review of systems reviewed and otherwise negative  BP (!) 166/70   Temp 97.7 F (36.5 C) (Oral)   Ht 5' (1.524 m)   Wt 125 lb (56.7 kg)   BMI 24.41 kg/m  In general she is a thin female no acute distress with profound hearing loss. Cardiopulmonary exam normal except for heart sounds extremely distant. Abdominal exam normal.  BP right arm sitting position repeated by me 190/80 pulse 70 and regular.  #1 hypertension not at goal........... increase Lopressor to 50 mg twice a day.......... follow-up in one month  #2 hypothyroidism..... Continue Synthroid check labs  B12 deficiency...Marland KitchenMarland KitchenMarland Kitchen continue B12 1 mL monthly  #4 mild depression......... continue Lexapro  Mild cognitive impairment........ continue Aricept.Marland KitchenMarland Kitchen

## 2017-03-14 ENCOUNTER — Other Ambulatory Visit: Payer: Self-pay

## 2017-03-14 DIAGNOSIS — R7989 Other specified abnormal findings of blood chemistry: Secondary | ICD-10-CM

## 2017-03-20 ENCOUNTER — Other Ambulatory Visit (INDEPENDENT_AMBULATORY_CARE_PROVIDER_SITE_OTHER): Payer: Medicare Other

## 2017-03-20 DIAGNOSIS — R946 Abnormal results of thyroid function studies: Secondary | ICD-10-CM | POA: Diagnosis not present

## 2017-03-20 DIAGNOSIS — R7989 Other specified abnormal findings of blood chemistry: Secondary | ICD-10-CM

## 2017-03-20 LAB — TSH: TSH: 7.37 u[IU]/mL — AB (ref 0.35–4.50)

## 2017-03-21 ENCOUNTER — Other Ambulatory Visit: Payer: Medicare Other

## 2017-04-01 ENCOUNTER — Ambulatory Visit (INDEPENDENT_AMBULATORY_CARE_PROVIDER_SITE_OTHER): Payer: Medicare Other | Admitting: Family Medicine

## 2017-04-01 ENCOUNTER — Encounter: Payer: Self-pay | Admitting: Family Medicine

## 2017-04-01 VITALS — BP 168/76 | HR 60 | Temp 97.8°F | Wt 126.4 lb

## 2017-04-01 DIAGNOSIS — I10 Essential (primary) hypertension: Secondary | ICD-10-CM

## 2017-04-01 DIAGNOSIS — I6523 Occlusion and stenosis of bilateral carotid arteries: Secondary | ICD-10-CM | POA: Diagnosis not present

## 2017-04-01 NOTE — Patient Instructions (Signed)
Norvasc 5 mg............ one tablet daily in the morning  Lopressor 50 mg......... one tablet twice daily  Check your blood pressure in the morning and the evening  Return in 3 weeks for follow-up........Marland Kitchen bring a record of all your blood pressure readings with you

## 2017-04-01 NOTE — Progress Notes (Signed)
Maria Clarke is a 83 year old widowed female comes in today for evaluation of hypertension. She has been on Norvasc 5 mg daily along with Lopressor 50 mg in the morning and 25 mg at bedtime  Last May we increased her Lopressor 50 mg twice a day however they continued to only give her 25 mg bedtime.  Her son checked her blood pressure last night it was 200/90. We gave her another full tablet of the Lopressor To bed rest another dose this morning and she's here for follow-up. She's otherwise asymptomatic.  Marland Kitchen All her medications are in a medication dispenser and she states she's not missed any doses.  BP (!) 168/76 (BP Location: Right Arm, Patient Position: Sitting, Cuff Size: Normal)   Pulse 60   Temp 97.8 F (36.6 C) (Oral)   Wt 126 lb 6.4 oz (57.3 kg)   SpO2 97%   BMI 24.69 kg/m  General she is a well-developed well-nourished female no acute distress BP right arm sitting position is 150/70 pulse 70 and regular  Hypertension.......... increase Lopressor 50 mg twice a day continue the Norvasc 5 mg daily follow-up in 3 weeks

## 2017-04-23 ENCOUNTER — Encounter: Payer: Self-pay | Admitting: Family Medicine

## 2017-04-23 ENCOUNTER — Ambulatory Visit (INDEPENDENT_AMBULATORY_CARE_PROVIDER_SITE_OTHER): Payer: Medicare Other | Admitting: Family Medicine

## 2017-04-23 VITALS — BP 140/67 | HR 59 | Temp 97.6°F | Wt 123.0 lb

## 2017-04-23 DIAGNOSIS — I6523 Occlusion and stenosis of bilateral carotid arteries: Secondary | ICD-10-CM

## 2017-04-23 DIAGNOSIS — I10 Essential (primary) hypertension: Secondary | ICD-10-CM | POA: Diagnosis not present

## 2017-04-23 DIAGNOSIS — Z23 Encounter for immunization: Secondary | ICD-10-CM

## 2017-04-23 NOTE — Patient Instructions (Signed)
Continue current medications  Check your blood pressure weekly in the morning  Consider seeing Dr. Tonia Ghent at [facility] for hearing evaluation

## 2017-04-23 NOTE — Addendum Note (Signed)
Addended by: Wyvonne Lenz on: 04/23/2017 11:48 AM   Modules accepted: Orders

## 2017-04-23 NOTE — Progress Notes (Signed)
Maria Clarke is a 83 year old widowed female nonsmoker who comes in today for follow-up of hypertension. We increased her beta blocker 2 months ago because her blood pressure was running 1 6244 systolic. Now her blood pressures 136/68. Her we checked her cuff and it's accurate. No side effects from increase medication. Her pulse is 60 and regular.  She continues to have difficulty hearing. We recommend she see Tonia Ghent at [facility]. She's had it radiates in the past but she has trouble getting a man.  BP 140/67 (BP Location: Left Arm, Cuff Size: Normal)   Pulse (!) 59   Temp 97.6 F (36.4 C) (Oral)   Wt 123 lb (55.8 kg)   BMI 24.02 kg/m  She's well-developed well-nourished female very hard of hearing. BP right arm sitting position 136/68  #1 hypertension at goal.... Continue current therapy follow-up in 3 months...Marland KitchenMarland Kitchen

## 2017-05-07 ENCOUNTER — Ambulatory Visit (INDEPENDENT_AMBULATORY_CARE_PROVIDER_SITE_OTHER): Payer: Medicare Other | Admitting: Family

## 2017-05-07 ENCOUNTER — Ambulatory Visit (HOSPITAL_COMMUNITY)
Admission: RE | Admit: 2017-05-07 | Discharge: 2017-05-07 | Disposition: A | Discharge status: Home / Self Care | Payer: Medicare Other | Source: Ambulatory Visit | Attending: Family | Admitting: Family

## 2017-05-07 ENCOUNTER — Encounter: Payer: Self-pay | Admitting: Family

## 2017-05-07 VITALS — BP 185/76 | HR 57 | Temp 96.8°F | Resp 18 | Ht 60.0 in | Wt 128.0 lb

## 2017-05-07 DIAGNOSIS — I6523 Occlusion and stenosis of bilateral carotid arteries: Secondary | ICD-10-CM

## 2017-05-07 LAB — VAS US CAROTID
LCCAPDIAS: 16 cm/s
LICADSYS: -49 cm/s
LICAPDIAS: -22 cm/s
LICAPSYS: -80 cm/s
Left CCA dist dias: 13 cm/s
Left CCA dist sys: 62 cm/s
Left CCA prox sys: 86 cm/s
Left ICA dist dias: -15 cm/s
RCCAPSYS: 57 cm/s
RIGHT CCA MID DIAS: 12 cm/s
RIGHT ECA DIAS: 28 cm/s
Right CCA prox dias: 11 cm/s
Right cca dist sys: -72 cm/s

## 2017-05-07 NOTE — Progress Notes (Signed)
Chief Complaint: Follow up Extracranial Carotid Artery Stenosis   History of Present Illness  Maria Clarke is a 83 y.o. female patient of Dr. Trula Slade who is here today for followup of her carotid stenosis. Her carotid disease was initially detected by bruit.   Patient has not had previous carotid artery intervention. Patient reports that she has never had a stroke or TIA, specifically she denies any history of amaurosis fugax or monocular blindness,  unilateral facial drooping, hemiplegia, or receptive or expressive aphasia.   She takes 2 Advil daily since 2015 for arthritis pain.  She denies claudication symptoms with walking.  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   Pt Diabetic: No Pt smoker: non-smoker  Pt meds include: Statin : No: Lipitor was stopped by her PCP due to myalgias ASA: Yes Other anticoagulants/antiplatelets: no     Past Medical History:  Diagnosis Date  . Atrial tachycardia ([place])    a. Noted 02/2013 on EKG.  . Carotid artery disease ([place])    Carotid duplex showed 53-97% RICA and 6-73% LICA  . Depression   . Hypercholesteremia   . Hypertension   . Hypothyroidism     Social History Social History  Substance Use Topics  . Smoking status: Never Smoker  . Smokeless tobacco: Never Used  . Alcohol use No    Family History Family History  Problem Relation Age of Onset  . Diabetes Mother   . Heart disease Mother        After age 63  . Hyperlipidemia Mother   . Hypertension Mother   . Heart disease Sister        After age 33  . Asthma Sister   . Hyperlipidemia Sister   . Heart disease Brother        After age 9  . Diabetes Son   . Hypertension Son     Surgical History Past Surgical History:  Procedure Laterality Date  . BREAST SURGERY Left    Benign Tumor- several years ago  . CESAREAN SECTION    . EYE SURGERY Bilateral    cataract    Allergies  Allergen Reactions  . Other Other (See Comments)     REACTION: ORAL S.E.  . Sulfonamide Derivatives Other (See Comments)    REACTION: ORAL S.E.    Current Outpatient Prescriptions  Medication Sig Dispense Refill  . amLODipine (NORVASC) 5 MG tablet Take 1 tablet (5 mg total) by mouth daily. 90 tablet 4  . aspirin 81 MG tablet Take 81 mg by mouth daily.    . cyanocobalamin (,VITAMIN B-12,) 1000 MCG/ML injection Inject 1 mL (1,000 mcg total) into the skin every 30 (thirty) days. 10 mL 3  . donepezil (ARICEPT) 5 MG tablet Take 1 tablet (5 mg total) by mouth at bedtime. 90 tablet 4  . escitalopram (LEXAPRO) 5 MG tablet Take 1 tablet (5 mg total) by mouth at bedtime. 100 tablet 4  . levothyroxine (SYNTHROID, LEVOTHROID) 100 MCG tablet Take 1 tablet (100 mcg total) by mouth daily before breakfast. 100 tablet 4  . metoprolol tartrate (LOPRESSOR) 50 MG tablet 1 tablet twice daily 200 tablet 5   No current facility-administered medications for this visit.     Review of Systems : See HPI for pertinent positives and negatives.  Physical Examination  Vitals:   05/07/17 1003 05/07/17 1004  BP: (!) 180/75 (!) 185/76  Pulse: (!) 57   Resp: 18   Temp: (!) 96.8 F (36 C)  TempSrc: Oral   SpO2: 99%   Weight: 128 lb (58.1 kg)   Height: 5' (1.524 m)    Body mass index is 25 kg/m.  General: WDWN female in NAD. GAIT:normal Eyes: PERRLA Pulmonary: Non-labored respirations, CTAB, no rales, rhonchi, or wheezing.  Cardiac: regular rhythm, + murmur.  VASCULAR EXAM Carotid Bruits Right Left   Positive, possibly transmitted cardiac murmur Negative   Aorta is not palpable. Radial pulses are 1+ right, 2+ left, palpable.      LE Pulses Right Left   POPLITEAL not palpable  not palpable   POSTERIOR TIBIAL 2+ palpable  2+ palpable    DORSALIS PEDIS  ANTERIOR TIBIAL  2+ palpable  2+ palpable     Gastrointestinal: soft, nontender, BS WNL, no r/g, no palpable masses palpated.  Musculoskeletal: No muscle atrophy/wasting. M/S 5/5 throughout, Extremities without ischemic changes.  Neurologic: A&O X 3; Appropriate Affect, speech is normal, CN 2-12 intact, Pain and light touch intact in extremities, Motor exam as listed above     Assessment: Maria Clarke is a 83 y.o. female who has no history of stroke or TIA. Fortunately she does not have DM and has never used tobacco.  She is statin intolerant.  She takes a daily ASA.   Her blood pressure is quite elevated now, and pt showed me her blood pressure log, yesterday's blood pressure was about the same as today. I advised her to stop by her PCP's office and ask them to recheck her blood pressure and advise her.   DATA  Carotid Duplex (05/07/17):  Right ICA: 40-59% stenosis.  Left ICA: 1-39% stenosis.  >50% stenosis of the right ECA. Bilateral vertebral artery flow is antegrade.  Bilateral subclavian artery waveforms are normal.  No significant change compared to the exam on 11-05-16.   Plan: Follow-up in 1 year with Carotid Duplex scan.   I discussed in depth with the patient the nature of atherosclerosis, and emphasized the importance of maximal medical management including strict control of blood pressure, blood glucose, and lipid levels, obtaining regular exercise, and continued cessation of smoking.  The patient is aware that without maximal medical management the underlying atherosclerotic disease process will progress, limiting the benefit of any interventions. The patient was given information about stroke prevention and what symptoms should prompt the patient to seek immediate medical care. Thank you for allowing [place] to participate in this patient's care.  Clemon Chambers, RN, MSN, FNP-C [facility] Office: [phone]  Clinic Physician:  Early  05/07/17 10:14 AM

## 2017-05-07 NOTE — Patient Instructions (Signed)
Stroke Prevention Some medical conditions and behaviors are associated with an increased chance of having a stroke. You may prevent a stroke by making healthy choices and managing medical conditions. How can I reduce my risk of having a stroke?  Stay physically active. Get at least 30 minutes of activity on most or all days.  Do not smoke. It may also be helpful to avoid exposure to secondhand smoke.  Limit alcohol use. Moderate alcohol use is considered to be: ? No more than 2 drinks per day for men. ? No more than 1 drink per day for nonpregnant women.  Eat healthy foods. This involves: ? Eating 5 or more servings of fruits and vegetables a day. ? Making dietary changes that address high blood pressure (hypertension), high cholesterol, diabetes, or obesity.  Manage your cholesterol levels. ? Making food choices that are high in fiber and low in saturated fat, trans fat, and cholesterol may control cholesterol levels. ? Take any prescribed medicines to control cholesterol as directed by your health care provider.  Manage your diabetes. ? Controlling your carbohydrate and sugar intake is recommended to manage diabetes. ? Take any prescribed medicines to control diabetes as directed by your health care provider.  Control your hypertension. ? Making food choices that are low in salt (sodium), saturated fat, trans fat, and cholesterol is recommended to manage hypertension. ? Ask your health care provider if you need treatment to lower your blood pressure. Take any prescribed medicines to control hypertension as directed by your health care provider. ? If you are 18-39 years of age, have your blood pressure checked every 3-5 years. If you are 40 years of age or older, have your blood pressure checked every year.  Maintain a healthy weight. ? Reducing calorie intake and making food choices that are low in sodium, saturated fat, trans fat, and cholesterol are recommended to manage  weight.  Stop drug abuse.  Avoid taking birth control pills. ? Talk to your health care provider about the risks of taking birth control pills if you are over 35 years old, smoke, get migraines, or have ever had a blood clot.  Get evaluated for sleep disorders (sleep apnea). ? Talk to your health care provider about getting a sleep evaluation if you snore a lot or have excessive sleepiness.  Take medicines only as directed by your health care provider. ? For some people, aspirin or blood thinners (anticoagulants) are helpful in reducing the risk of forming abnormal blood clots that can lead to stroke. If you have the irregular heart rhythm of atrial fibrillation, you should be on a blood thinner unless there is a good reason you cannot take them. ? Understand all your medicine instructions.  Make sure that other conditions (such as anemia or atherosclerosis) are addressed. Get help right away if:  You have sudden weakness or numbness of the face, arm, or leg, especially on one side of the body.  Your face or eyelid droops to one side.  You have sudden confusion.  You have trouble speaking (aphasia) or understanding.  You have sudden trouble seeing in one or both eyes.  You have sudden trouble walking.  You have dizziness.  You have a loss of balance or coordination.  You have a sudden, severe headache with no known cause.  You have new chest pain or an irregular heartbeat. Any of these symptoms may represent a serious problem that is an emergency. Do not wait to see if the symptoms will go away.   Get medical help at once. Call your local emergency services (911 in [place] Do not drive yourself to the hospital. This information is not intended to replace advice given to you by your health care provider. Make sure you discuss any questions you have with your health care provider. Document Released: 08/02/2004 Document Revised: 12/01/2015 Document Reviewed: 12/26/2012 Elsevier  Interactive Patient Education  2017 Elsevier Inc.     Preventing Cerebrovascular Disease Arteries are blood vessels that carry blood that contains oxygen from the heart to all parts of the body. Cerebrovascular disease affects arteries that supply the brain. Any condition that blocks or disrupts blood flow to the brain can cause cerebrovascular disease. Brain cells that lose blood supply start to die within minutes (stroke). Stroke is the main danger of cerebrovascular disease. Atherosclerosis and high blood pressure are common causes of cerebrovascular disease. Atherosclerosis is narrowing and hardening of an artery that results when fat, cholesterol, calcium, or other substances (plaque) build up inside an artery. Plaque reduces blood flow through the artery. High blood pressure increases the risk of bleeding inside the brain. Making diet and lifestyle changes to prevent atherosclerosis and high blood pressure lowers your risk of cerebrovascular disease. What nutrition changes can be made?  Eat more fruits, vegetables, and whole grains.  Reduce how much saturated fat you eat. To do this, eat less red meat and fewer full-fat dairy products.  Eat healthy proteins instead of red meat. Healthy proteins include: ? Fish. Eat fish that contains heart-healthy omega-3 fatty acids, twice a week. Examples include salmon, albacore tuna, mackerel, and herring. ? Chicken. ? Nuts. ? Low-fat or nonfat yogurt.  Avoid processed meats, like bacon and lunchmeat.  Avoid foods that contain: ? A lot of sugar, such as sweets and drinks with added sugar. ? A lot of salt (sodium). Avoid adding extra salt to your food, as told by your health care provider. ? Trans fats, such as margarine and baked goods. Trans fats may be listed as "partially hydrogenated oils" on food labels.  Check food labels to see how much sodium, sugar, and trans fats are in foods.  Use vegetable oils that contain low amounts of  saturated fat, such as olive oil or canola oil. What lifestyle changes can be made?  Drink alcohol in moderation. This means no more than 1 drink a day for nonpregnant women and 2 drinks a day for men. One drink equals 12 oz of beer, 5 oz of wine, or 1 oz of hard liquor.  If you are overweight, ask your health care provider to recommend a weight-loss plan for you. Losing 5-10 lb (2.2-4.5 kg) can reduce your risk of diabetes, atherosclerosis, and high blood pressure.  Exercise for 30?60 minutes on most days, or as much as told by your health care provider. ? Do moderate-intensity exercise, such as brisk walking, bicycling, and water aerobics. Ask your health care provider which activities are safe for you.  Do not use any products that contain nicotine or tobacco, such as cigarettes and e-cigarettes. If you need help quitting, ask your health care provider. Why are these changes important? Making these changes lowers your risk of many diseases that can cause cerebrovascular disease and stroke. Stroke is a leading cause of death and disability. Making these changes also improves your overall health and quality of life. What can I do to lower my risk? The following factors make you more likely to develop cerebrovascular disease:  Being overweight.  Smoking.  Being physically inactive.    Eating a high-fat diet.  Having certain health conditions, such as: ? Diabetes. ? High blood pressure. ? Heart disease. ? Atherosclerosis. ? High cholesterol. ? Sickle cell disease.  Talk with your health care provider about your risk for cerebrovascular disease. Work with your health care provider to control diseases that you have that may contribute to cerebrovascular disease. Your health care provider may prescribe medicines to help prevent major causes of cerebrovascular disease. Where to find more information: Learn more about preventing cerebrovascular disease from:  National Heart, Lung, and  Blood Institute: www.nhlbi.nih.gov/health/health-topics/topics/stroke  Centers for Disease Control and Prevention: cdc.gov/stroke/about.htm  Summary  Cerebrovascular disease can lead to a stroke.  Atherosclerosis and high blood pressure are major causes of cerebrovascular disease.  Making diet and lifestyle changes can reduce your risk of cerebrovascular disease.  Work with your health care provider to get your risk factors under control to reduce your risk of cerebrovascular disease. This information is not intended to replace advice given to you by your health care provider. Make sure you discuss any questions you have with your health care provider. Document Released: 07/10/2015 Document Revised: 01/13/2016 Document Reviewed: 07/10/2015 Elsevier Interactive Patient Education  2018 Elsevier Inc.  

## 2017-05-22 NOTE — Addendum Note (Signed)
Addended by: Lianne Cure A on: 05/22/2017 11:58 AM   Modules accepted: Orders

## 2017-06-17 ENCOUNTER — Other Ambulatory Visit: Payer: Medicare Other

## 2017-08-21 ENCOUNTER — Other Ambulatory Visit: Payer: Self-pay | Admitting: Family Medicine

## 2017-12-08 ENCOUNTER — Other Ambulatory Visit: Payer: Self-pay | Admitting: Family Medicine

## 2017-12-08 DIAGNOSIS — N912 Amenorrhea, unspecified: Secondary | ICD-10-CM

## 2018-01-16 ENCOUNTER — Other Ambulatory Visit: Payer: Self-pay | Admitting: Family Medicine

## 2018-01-16 DIAGNOSIS — E038 Other specified hypothyroidism: Secondary | ICD-10-CM

## 2018-01-16 DIAGNOSIS — I10 Essential (primary) hypertension: Secondary | ICD-10-CM

## 2018-01-16 NOTE — Telephone Encounter (Signed)
Pt needs OV for more refill

## 2018-01-17 ENCOUNTER — Other Ambulatory Visit: Payer: Self-pay | Admitting: Family Medicine

## 2018-01-17 DIAGNOSIS — I10 Essential (primary) hypertension: Secondary | ICD-10-CM

## 2018-01-30 ENCOUNTER — Other Ambulatory Visit: Payer: Self-pay | Admitting: Family Medicine

## 2018-01-30 DIAGNOSIS — N912 Amenorrhea, unspecified: Secondary | ICD-10-CM

## 2018-02-05 ENCOUNTER — Other Ambulatory Visit: Payer: Self-pay

## 2018-02-07 ENCOUNTER — Other Ambulatory Visit: Payer: Self-pay | Admitting: Family Medicine

## 2018-02-08 ENCOUNTER — Other Ambulatory Visit: Payer: Self-pay | Admitting: Family Medicine

## 2018-02-08 DIAGNOSIS — I10 Essential (primary) hypertension: Secondary | ICD-10-CM

## 2018-02-10 ENCOUNTER — Other Ambulatory Visit: Payer: Self-pay | Admitting: *Deleted

## 2018-02-10 MED ORDER — DONEPEZIL HCL 5 MG PO TABS: 5.0000 mg | ORAL_TABLET | Freq: Every day | ORAL | 4 refills | Status: DC

## 2018-02-18 ENCOUNTER — Other Ambulatory Visit: Payer: Self-pay | Admitting: Family Medicine

## 2018-02-18 DIAGNOSIS — N912 Amenorrhea, unspecified: Secondary | ICD-10-CM

## 2018-04-18 ENCOUNTER — Other Ambulatory Visit: Payer: Self-pay

## 2018-04-18 ENCOUNTER — Telehealth: Payer: Self-pay | Admitting: Family Medicine

## 2018-04-18 MED ORDER — DONEPEZIL HCL 5 MG PO TABS: 5.0000 mg | ORAL_TABLET | Freq: Every day | ORAL | 4 refills | Status: DC

## 2018-04-18 NOTE — Telephone Encounter (Signed)
Copied from [place] [phone]. Topic: Quick Communication - Rx Refill/Question >> Apr 18, 2018  9:50 AM Blase Mess A wrote: Medication: donepezil (ARICEPT) 5 MG tablet [[id number]] 47 day supply  Has the patient contacted their pharmacy? Yes  (Agent: If no, request that the patient contact the pharmacy for the refill.) (Agent: If yes, when and what did the pharmacy advise?)  Preferred Pharmacy (with phone number or street name): [facility] [address] [address]  [phone]  Agent: Please be advised that RX refills may take up to 3 business days. We ask that you follow-up with your pharmacy.

## 2018-04-18 NOTE — Telephone Encounter (Signed)
Pt wanting Aricept sent to a different pharmacy. [facility] [address] and they have the med ready for pt to pick up. Will reorder to send prescription to [facility] [address] [facility].  Attempted to call pt to verify pharmacies did not get an answer and pt does not have voicemail  Aricept refill Last Refill:02/10/18 # 90 4 RF Last OV: 12/05/16 PCP: Dr Sherren Mocha Pharmacy:[facility] [address].

## 2018-04-18 NOTE — Telephone Encounter (Signed)
Pt's son, [name] called to back; he confirmed that the prescription should be sent to [facility]; explained that prescription has been sent; he verbalizes understanding.

## 2018-05-27 ENCOUNTER — Ambulatory Visit (INDEPENDENT_AMBULATORY_CARE_PROVIDER_SITE_OTHER): Payer: Medicare Other

## 2018-05-27 ENCOUNTER — Ambulatory Visit: Payer: Medicare Other

## 2018-05-27 DIAGNOSIS — Z23 Encounter for immunization: Secondary | ICD-10-CM | POA: Diagnosis not present

## 2018-06-17 DIAGNOSIS — H524 Presbyopia: Secondary | ICD-10-CM | POA: Diagnosis not present

## 2018-06-17 DIAGNOSIS — Z961 Presence of intraocular lens: Secondary | ICD-10-CM | POA: Diagnosis not present

## 2018-06-17 DIAGNOSIS — H43813 Vitreous degeneration, bilateral: Secondary | ICD-10-CM | POA: Diagnosis not present

## 2018-06-17 DIAGNOSIS — H52223 Regular astigmatism, bilateral: Secondary | ICD-10-CM | POA: Diagnosis not present

## 2018-06-17 DIAGNOSIS — H353131 Nonexudative age-related macular degeneration, bilateral, early dry stage: Secondary | ICD-10-CM | POA: Diagnosis not present

## 2018-06-19 ENCOUNTER — Other Ambulatory Visit: Payer: Self-pay | Admitting: *Deleted

## 2018-06-19 DIAGNOSIS — N912 Amenorrhea, unspecified: Secondary | ICD-10-CM

## 2018-06-19 MED ORDER — CYANOCOBALAMIN 1000 MCG/ML IJ SOLN: 1000.0000 ug | INTRAMUSCULAR | 1 refills | Status: DC

## 2018-08-19 ENCOUNTER — Other Ambulatory Visit: Payer: Self-pay | Admitting: Family Medicine

## 2018-08-19 DIAGNOSIS — I10 Essential (primary) hypertension: Secondary | ICD-10-CM

## 2018-08-19 NOTE — Telephone Encounter (Signed)
Refill until April - until she sees [place].

## 2018-08-19 NOTE — Telephone Encounter (Signed)
OK to fill?   Last OV was in 2018, Future OV with [facility] on 10/29/18

## 2018-09-04 ENCOUNTER — Other Ambulatory Visit: Payer: Self-pay

## 2018-09-04 DIAGNOSIS — I6523 Occlusion and stenosis of bilateral carotid arteries: Secondary | ICD-10-CM

## 2018-09-08 ENCOUNTER — Ambulatory Visit (INDEPENDENT_AMBULATORY_CARE_PROVIDER_SITE_OTHER): Payer: Medicare Other | Admitting: Physician Assistant

## 2018-09-08 ENCOUNTER — Encounter: Payer: Self-pay | Admitting: Family

## 2018-09-08 ENCOUNTER — Other Ambulatory Visit: Payer: Self-pay

## 2018-09-08 ENCOUNTER — Ambulatory Visit (HOSPITAL_COMMUNITY)
Admission: RE | Admit: 2018-09-08 | Discharge: 2018-09-08 | Disposition: A | Discharge status: Home / Self Care | Payer: Medicare Other | Source: Ambulatory Visit | Attending: Family | Admitting: Family

## 2018-09-08 VITALS — BP 195/78 | HR 72 | Temp 97.1°F | Resp 16 | Ht 60.0 in | Wt 137.8 lb

## 2018-09-08 DIAGNOSIS — I6523 Occlusion and stenosis of bilateral carotid arteries: Secondary | ICD-10-CM

## 2018-09-08 NOTE — Progress Notes (Signed)
    Established Carotid Patient   History of Present Illness   Maria Clarke is a 85 y.o. ([birth year]) female who presents for follow-up of carotid artery stenosis.  Previous carotid studies demonstrated: RICA 39-76% stenosis, LICA 7-34% stenosis.  Patient has no history of TIA or stroke symptom.  The patient has not had amaurosis fugax or monocular blindness.  The patient has not had facial drooping or hemiplegia.  The patient has not had receptive or expressive aphasia.  She denies tobacco use.  She is taking an aspirin daily.  Current Outpatient Medications  Medication Sig Dispense Refill  . amLODipine (NORVASC) 5 MG tablet TAKE 1 TABLET BY MOUTH EVERY DAY 90 tablet 0  . aspirin 81 MG tablet Take 81 mg by mouth daily.    . cyanocobalamin (,VITAMIN B-12,) 1000 MCG/ML injection Inject 1 mL (1,000 mcg total) into the skin every 30 (thirty) days. 3 mL 1  . donepezil (ARICEPT) 5 MG tablet Take 1 tablet (5 mg total) by mouth at bedtime. 90 tablet 4  . escitalopram (LEXAPRO) 5 MG tablet TAKE 1 TABLET (5 MG TOTAL) BY MOUTH AT BEDTIME. 100 tablet 4  . levothyroxine (SYNTHROID, LEVOTHROID) 100 MCG tablet TAKE 1 TABLET (100 MCG TOTAL) BY MOUTH DAILY BEFORE BREAKFAST. 90 tablet 0  . metoprolol tartrate (LOPRESSOR) 50 MG tablet TAKE 1 TABLET BY MOUTH TWICE A DAY 180 tablet 3   No current facility-administered medications for this visit.     On ROS today: 10 system ROS otherwise negative   Physical Examination   Vitals:   09/08/18 1519 09/08/18 1521  BP: (!) 185/79 (!) 195/78  Pulse: 72   Resp: 16   Temp: (!) 97.1 F (36.2 C)   TempSrc: Oral   SpO2: 99%   Weight: 137 lb 12.6 oz (62.5 kg)   Height: 5' (1.524 m)    Body mass index is 26.91 kg/m.  General Alert, O x 3, WD, NAD  Neck Supple, mid-line trachea,    Pulmonary Sym exp, good B air movt, CTA B  Cardiac RRR, Nl S1, S2  Vascular Vessel Right Left  Radial Palpable Palpable  Carotid Palpable, Bruit present Palpable, No Bruit    Aorta Not palpable N/A    Gastro- intestinal soft, non-distended, non-tender to palpation,   Musculo- skeletal M/S 5/5 throughout  , Extremities without ischemic changes    Neurologic Cranial nerves 2-12 intact    Non-Invasive Vascular Imaging   B Carotid Duplex (09/08/18):   R ICA stenosis:  40-59%  R VA:  patent and antegrade  L ICA stenosis:  1-39%  L VA:  patent and antegrade   Medical Decision Making   Maria Clarke is a 85 y.o. female who presents with routine carotid duplex for carotid artery stenosis   Carotid duplex unchanged over the past year  Right ICA 40 to 59% stenosis and left ICA 1 to 39% stenosis  Continue aspirin daily  Recheck carotid duplex in 1 year  Follow regularly with PCP for management of other chronic medical conditions  Dagoberto Ligas PA-C [facility] Office: [phone]  Clinic MD: Dr. Trula Slade

## 2018-10-29 ENCOUNTER — Other Ambulatory Visit: Payer: Self-pay

## 2018-10-29 ENCOUNTER — Ambulatory Visit (INDEPENDENT_AMBULATORY_CARE_PROVIDER_SITE_OTHER): Payer: Medicare Other | Admitting: Adult Health

## 2018-10-29 ENCOUNTER — Encounter: Payer: Self-pay | Admitting: Adult Health

## 2018-10-29 DIAGNOSIS — F339 Major depressive disorder, recurrent, unspecified: Secondary | ICD-10-CM | POA: Diagnosis not present

## 2018-10-29 DIAGNOSIS — I10 Essential (primary) hypertension: Secondary | ICD-10-CM | POA: Diagnosis not present

## 2018-10-29 DIAGNOSIS — E039 Hypothyroidism, unspecified: Secondary | ICD-10-CM

## 2018-10-29 DIAGNOSIS — R4189 Other symptoms and signs involving cognitive functions and awareness: Secondary | ICD-10-CM | POA: Insufficient documentation

## 2018-10-29 DIAGNOSIS — Z7689 Persons encountering health services in other specified circumstances: Secondary | ICD-10-CM | POA: Diagnosis not present

## 2018-10-29 NOTE — Progress Notes (Signed)
Virtual Visit via Telephone Note  I connected with Maria Clarke on 10/29/18 at  8:30 AM EDT by telephone and verified that I am speaking with the correct person using two identifiers.   I discussed the limitations, risks, security and privacy concerns of performing an evaluation and management service by telephone and the availability of in person appointments. I also discussed with the patient that there may be a patient responsible charge related to this service. The patient expressed understanding and agreed to proceed.  Location patient: home Location provider: work or home office Participants present for the call: patient, provider, and son Maria Clarke) Patient did not have a visit in the prior 7 days to address this/these issue(s).   History of Present Illness: 85 year old female who  has a past medical history of Atrial tachycardia ([place]), Carotid artery disease ([place]), Depression, Hypercholesteremia, Hypertension, and Hypothyroidism. She is a former patient of Dr. Sherren Mocha who is transferring care today.   Essential Hypertension -currently prescribed Norvasc 5 mg and metoprolol 50 mg twice daily. Her family member reports reading at home of 120-150/70-80's. She denies chest pain, headaches, or syncope. She does endorse intermittent episodes of dizziness that last a few seconds.  BP Readings from Last 3 Encounters:  09/08/18 (!) 195/78  05/07/17 (!) 185/76  04/23/17 140/67   Hypothyroidism- She takes Synthroid M/W/F  Depression -controlled with Lexapro 5 mg nightly  Cognitive Impairment -mild cognitive impairment, currently prescribed Aricept 5 mg. Her son     Observations/Objective: Patient sounds cheerful and well on the phone. I do not appreciate any SOB. Speech and thought processing are grossly intact.  Assessment and Plan:   Follow Up Instructions:  1. Encounter to establish care - Will follow up one Covid restrictions are lifted.   2. Essential hypertension - Reports  well controlled BP at home - Continue with current medications   3. Hypothyroidism, unspecified type - Continue with synthroid as directed  4. Depression, recurrent ([place]) - Continue with Lexapro 5 mg QHS  5. Cognitive impairment - Able to answer questions appropriately on phone call.  - Continue with Aricept 5 mg QHS    I did not refer this patient for an OV in the next 24 hours for this/these issue(s).  I discussed the assessment and treatment plan with the patient. The patient was provided an opportunity to ask questions and all were answered. The patient agreed with the plan and demonstrated an understanding of the instructions.   The patient was advised to call back or seek an in-person evaluation if the symptoms worsen or if the condition fails to improve as anticipated.  I provided 28 minutes of non-face-to-face time during this encounter.   [name], NP

## 2018-11-14 ENCOUNTER — Other Ambulatory Visit: Payer: Self-pay | Admitting: Family Medicine

## 2018-11-14 DIAGNOSIS — I10 Essential (primary) hypertension: Secondary | ICD-10-CM

## 2018-12-22 ENCOUNTER — Telehealth: Payer: Self-pay | Admitting: Adult Health

## 2018-12-22 DIAGNOSIS — E038 Other specified hypothyroidism: Secondary | ICD-10-CM

## 2018-12-22 DIAGNOSIS — I10 Essential (primary) hypertension: Secondary | ICD-10-CM

## 2018-12-22 DIAGNOSIS — N912 Amenorrhea, unspecified: Secondary | ICD-10-CM

## 2018-12-22 NOTE — Telephone Encounter (Signed)
Copied from [place] [id number]. Topic: Quick Communication - Rx Refill/Question >> Dec 22, 2018 10:17 AM Waylan Rocher, Lumin L wrote: Medication: cyanocobalamin (,VITAMIN B-12,) 1000 MCG/ML injection (3 injections), amLODipine (NORVASC) 5 MG tablet, metoprolol tartrate (LOPRESSOR) 50 MG tablet, escitalopram (LEXAPRO) 5 MG tablet, levothyroxine (SYNTHROID, LEVOTHROID) 100 MCG tablet (90 day supplies)  Has the patient contacted their pharmacy? yes  (Agent: If no, request that the patient contact the pharmacy for the refill.) (Agent: If yes, when and what did the pharmacy advise?) no refills  Preferred Pharmacy (with phone number or street name): [id number] [place], [place] [address] [place] [address] Phone: [phone] Fax: [phone]  Agent: Please be advised that RX refills may take up to 3 business days. We ask that you follow-up with your pharmacy.

## 2018-12-23 MED ORDER — AMLODIPINE BESYLATE 5 MG PO TABS: 5.0000 mg | ORAL_TABLET | Freq: Every day | ORAL | 1 refills | Status: DC

## 2018-12-23 MED ORDER — CYANOCOBALAMIN 1000 MCG/ML IJ SOLN: 1000.0000 ug | INTRAMUSCULAR | 1 refills | Status: AC

## 2018-12-23 MED ORDER — LEVOTHYROXINE SODIUM 100 MCG PO TABS: 100.0000 ug | ORAL_TABLET | Freq: Every day | ORAL | 0 refills | Status: DC

## 2018-12-23 MED ORDER — ESCITALOPRAM OXALATE 5 MG PO TABS: 5.0000 mg | ORAL_TABLET | Freq: Every day | ORAL | 1 refills | Status: DC

## 2018-12-23 MED ORDER — METOPROLOL TARTRATE 50 MG PO TABS
ORAL_TABLET | ORAL | 1 refills | Status: DC
Start: 2018-12-23 — End: 2019-09-16

## 2018-12-23 NOTE — Telephone Encounter (Signed)
Ok to do all for 6 months

## 2018-12-23 NOTE — Telephone Encounter (Signed)
Sent to the pharmacy by e-scribe. 

## 2019-01-15 ENCOUNTER — Other Ambulatory Visit: Payer: Self-pay | Admitting: Adult Health

## 2019-01-15 DIAGNOSIS — N912 Amenorrhea, unspecified: Secondary | ICD-10-CM

## 2019-01-15 NOTE — Telephone Encounter (Signed)
DENIED.  FILLED ON 12/23/2018.  REQUEST IS TOO EARLY.

## 2019-01-19 ENCOUNTER — Encounter: Payer: Self-pay | Admitting: Adult Health

## 2019-01-19 DIAGNOSIS — D3617 Benign neoplasm of peripheral nerves and autonomic nervous system of trunk, unspecified: Secondary | ICD-10-CM | POA: Diagnosis not present

## 2019-01-19 DIAGNOSIS — D235 Other benign neoplasm of skin of trunk: Secondary | ICD-10-CM | POA: Diagnosis not present

## 2019-03-30 ENCOUNTER — Other Ambulatory Visit: Payer: Self-pay | Admitting: Adult Health

## 2019-03-30 DIAGNOSIS — E038 Other specified hypothyroidism: Secondary | ICD-10-CM

## 2019-04-01 NOTE — Telephone Encounter (Signed)
Pt has appt on 04/02/2019 @ 9:20.  Will hold until results are back.

## 2019-04-01 NOTE — Telephone Encounter (Signed)
Ok to send in for 30 days. We need to check TSH in that time frame

## 2019-04-02 ENCOUNTER — Other Ambulatory Visit (INDEPENDENT_AMBULATORY_CARE_PROVIDER_SITE_OTHER): Payer: Medicare Other

## 2019-04-02 ENCOUNTER — Other Ambulatory Visit: Payer: Self-pay

## 2019-04-02 DIAGNOSIS — E039 Hypothyroidism, unspecified: Secondary | ICD-10-CM | POA: Diagnosis not present

## 2019-04-02 LAB — TSH: TSH: 8.77 u[IU]/mL — ABNORMAL HIGH (ref 0.35–4.50)

## 2019-04-08 NOTE — Telephone Encounter (Signed)
Sent to the pharmacy by e-scribe for 30 days. 

## 2019-04-19 ENCOUNTER — Other Ambulatory Visit: Payer: Self-pay | Admitting: Adult Health

## 2019-04-19 DIAGNOSIS — N912 Amenorrhea, unspecified: Secondary | ICD-10-CM

## 2019-04-21 NOTE — Telephone Encounter (Signed)
DENIED.  FILLED FOR SIX MONTHS ON 12/23/2018.  MESSAGE SENT TO THE PHARMACY.

## 2019-05-05 DIAGNOSIS — Z23 Encounter for immunization: Secondary | ICD-10-CM | POA: Diagnosis not present

## 2019-05-07 ENCOUNTER — Other Ambulatory Visit: Payer: Self-pay | Admitting: Adult Health

## 2019-05-07 DIAGNOSIS — E038 Other specified hypothyroidism: Secondary | ICD-10-CM

## 2019-05-08 ENCOUNTER — Other Ambulatory Visit: Payer: Self-pay

## 2019-05-08 ENCOUNTER — Telehealth (INDEPENDENT_AMBULATORY_CARE_PROVIDER_SITE_OTHER): Payer: Medicare Other | Admitting: Adult Health

## 2019-05-08 DIAGNOSIS — E038 Other specified hypothyroidism: Secondary | ICD-10-CM

## 2019-05-08 DIAGNOSIS — M5432 Sciatica, left side: Secondary | ICD-10-CM | POA: Diagnosis not present

## 2019-05-08 DIAGNOSIS — E039 Hypothyroidism, unspecified: Secondary | ICD-10-CM

## 2019-05-08 MED ORDER — METHYLPREDNISOLONE 4 MG PO TBPK: ORAL_TABLET | ORAL | 0 refills | Status: DC

## 2019-05-08 MED ORDER — LEVOTHYROXINE SODIUM 100 MCG PO TABS: 100.0000 ug | ORAL_TABLET | Freq: Every day | ORAL | 0 refills | Status: DC

## 2019-05-08 NOTE — Telephone Encounter (Signed)
Appt today.  Will hold

## 2019-05-08 NOTE — Progress Notes (Signed)
Virtual Visit via Telephone Note  I connected with Maria Clarke on 05/08/19 at  1:00 PM EDT by telephone and verified that I am speaking with the correct person using two identifiers.   I discussed the limitations, risks, security and privacy concerns of performing an evaluation and management service by telephone and the availability of in person appointments. I also discussed with the patient that there may be a patient responsible charge related to this service. The patient expressed understanding and agreed to proceed.  Location patient: home Location provider: work or home office Participants present for the call: patient, provider, son  Patient did not have a visit in the prior 7 days to address this/these issue(s).   History of Present Illness: 85 year old female who is being evaluated today for an acute issue of left leg pain.  She reports that her pain started about 2 weeks ago.  Pain starts around the left hip and radiates down the outside of her left leg and stops about the knee.  Pain is described as "aching".  Pain is not worse with walking or sitting.  Pain has been constant.  The only thing that she is found that helps is massage.  Denies calf pain, or lower extremity swelling.   Observations/Objective: Patient sounds cheerful and well on the phone. I do not appreciate any SOB. Speech and thought processing are grossly intact. Patient reported vitals:  Assessment and Plan: 1. Sciatica of left side -Appears to be sciatic pain.  Will treat with Medrol Dosepak.  She can use a warm compress and Motrin as needed. - methylPREDNISolone (MEDROL DOSEPAK) 4 MG TBPK tablet; Take as directed  Dispense: 21 tablet; Refill: 0   Follow Up Instructions:  I did not refer this patient for an OV in the next 24 hours for this/these issue(s).  I discussed the assessment and treatment plan with the patient. The patient was provided an opportunity to ask questions and all were answered. The  patient agreed with the plan and demonstrated an understanding of the instructions.   The patient was advised to call back or seek an in-person evaluation if the symptoms worsen or if the condition fails to improve as anticipated.  I provided 15  minutes of non-face-to-face time during this encounter.   [name], NP

## 2019-05-08 NOTE — Addendum Note (Signed)
Addended by: Apolinar Junes on: 05/08/2019 03:55 PM   Modules accepted: Orders

## 2019-05-12 ENCOUNTER — Emergency Department (HOSPITAL_COMMUNITY): Payer: Medicare Other

## 2019-05-12 ENCOUNTER — Telehealth: Payer: Self-pay | Admitting: *Deleted

## 2019-05-12 ENCOUNTER — Other Ambulatory Visit: Payer: Self-pay

## 2019-05-12 ENCOUNTER — Encounter (HOSPITAL_COMMUNITY): Payer: Self-pay | Admitting: Emergency Medicine

## 2019-05-12 ENCOUNTER — Observation Stay (HOSPITAL_BASED_OUTPATIENT_CLINIC_OR_DEPARTMENT_OTHER)
Admission: EM | Admit: 2019-05-12 | Discharge: 2019-05-13 | Disposition: A | Discharge status: Home / Self Care | Payer: Medicare Other | Source: Home / Self Care | Attending: Emergency Medicine | Admitting: Emergency Medicine

## 2019-05-12 DIAGNOSIS — R4781 Slurred speech: Secondary | ICD-10-CM | POA: Diagnosis not present

## 2019-05-12 DIAGNOSIS — G459 Transient cerebral ischemic attack, unspecified: Secondary | ICD-10-CM | POA: Insufficient documentation

## 2019-05-12 DIAGNOSIS — Z882 Allergy status to sulfonamides status: Secondary | ICD-10-CM | POA: Insufficient documentation

## 2019-05-12 DIAGNOSIS — I10 Essential (primary) hypertension: Secondary | ICD-10-CM | POA: Insufficient documentation

## 2019-05-12 DIAGNOSIS — R262 Difficulty in walking, not elsewhere classified: Secondary | ICD-10-CM | POA: Insufficient documentation

## 2019-05-12 DIAGNOSIS — Z7982 Long term (current) use of aspirin: Secondary | ICD-10-CM | POA: Insufficient documentation

## 2019-05-12 DIAGNOSIS — I6521 Occlusion and stenosis of right carotid artery: Secondary | ICD-10-CM | POA: Insufficient documentation

## 2019-05-12 DIAGNOSIS — Z79899 Other long term (current) drug therapy: Secondary | ICD-10-CM | POA: Insufficient documentation

## 2019-05-12 DIAGNOSIS — I639 Cerebral infarction, unspecified: Secondary | ICD-10-CM | POA: Diagnosis not present

## 2019-05-12 DIAGNOSIS — E782 Mixed hyperlipidemia: Secondary | ICD-10-CM | POA: Insufficient documentation

## 2019-05-12 DIAGNOSIS — Z20828 Contact with and (suspected) exposure to other viral communicable diseases: Secondary | ICD-10-CM | POA: Insufficient documentation

## 2019-05-12 DIAGNOSIS — R4701 Aphasia: Secondary | ICD-10-CM | POA: Diagnosis not present

## 2019-05-12 DIAGNOSIS — I4510 Unspecified right bundle-branch block: Secondary | ICD-10-CM | POA: Diagnosis not present

## 2019-05-12 DIAGNOSIS — E039 Hypothyroidism, unspecified: Secondary | ICD-10-CM | POA: Insufficient documentation

## 2019-05-12 DIAGNOSIS — R29818 Other symptoms and signs involving the nervous system: Secondary | ICD-10-CM | POA: Diagnosis not present

## 2019-05-12 LAB — I-STAT CHEM 8, ED
BUN: 17 mg/dL (ref 8–23)
Calcium, Ion: 1.19 mmol/L (ref 1.15–1.40)
Chloride: 103 mmol/L (ref 98–111)
Creatinine, Ser: 1 mg/dL (ref 0.44–1.00)
Glucose, Bld: 165 mg/dL — ABNORMAL HIGH (ref 70–99)
HCT: 41 % (ref 36.0–46.0)
Hemoglobin: 13.9 g/dL (ref 12.0–15.0)
Potassium: 3.3 mmol/L — ABNORMAL LOW (ref 3.5–5.1)
Sodium: 141 mmol/L (ref 135–145)
TCO2: 24 mmol/L (ref 22–32)

## 2019-05-12 LAB — COMPREHENSIVE METABOLIC PANEL
ALT: 12 U/L (ref 0–44)
AST: 21 U/L (ref 15–41)
Albumin: 3.3 g/dL — ABNORMAL LOW (ref 3.5–5.0)
Alkaline Phosphatase: 58 U/L (ref 38–126)
Anion gap: 14 (ref 5–15)
BUN: 16 mg/dL (ref 8–23)
CO2: 23 mmol/L (ref 22–32)
Calcium: 9.5 mg/dL (ref 8.9–10.3)
Chloride: 102 mmol/L (ref 98–111)
Creatinine, Ser: 1.05 mg/dL — ABNORMAL HIGH (ref 0.44–1.00)
GFR calc Af Amer: 52 mL/min — ABNORMAL LOW (ref >60)
GFR calc non Af Amer: 45 mL/min — ABNORMAL LOW (ref >60)
Glucose, Bld: 172 mg/dL — ABNORMAL HIGH (ref 70–99)
Potassium: 3.3 mmol/L — ABNORMAL LOW (ref 3.5–5.1)
Sodium: 139 mmol/L (ref 135–145)
Total Bilirubin: 0.4 mg/dL (ref 0.3–1.2)
Total Protein: 6.2 g/dL — ABNORMAL LOW (ref 6.5–8.1)

## 2019-05-12 LAB — CBC
HCT: 42.4 % (ref 36.0–46.0)
Hemoglobin: 13.8 g/dL (ref 12.0–15.0)
MCH: 30.5 pg (ref 26.0–34.0)
MCHC: 32.5 g/dL (ref 30.0–36.0)
MCV: 93.6 fL (ref 80.0–100.0)
Platelets: 237 10*3/uL (ref 150–400)
RBC: 4.53 MIL/uL (ref 3.87–5.11)
RDW: 13.6 % (ref 11.5–15.5)
WBC: 9.5 10*3/uL (ref 4.0–10.5)
nRBC: 0 % (ref 0.0–0.2)

## 2019-05-12 LAB — URINALYSIS, ROUTINE W REFLEX MICROSCOPIC
Bacteria, UA: NONE SEEN
Bilirubin Urine: NEGATIVE
Glucose, UA: NEGATIVE mg/dL
Ketones, ur: NEGATIVE mg/dL
Leukocytes,Ua: NEGATIVE
Nitrite: NEGATIVE
Protein, ur: NEGATIVE mg/dL
Specific Gravity, Urine: 1.033 — ABNORMAL HIGH (ref 1.005–1.030)
pH: 6 (ref 5.0–8.0)

## 2019-05-12 LAB — RAPID URINE DRUG SCREEN, HOSP PERFORMED
Amphetamines: NOT DETECTED
Barbiturates: NOT DETECTED
Benzodiazepines: NOT DETECTED
Cocaine: NOT DETECTED
Opiates: NOT DETECTED
Tetrahydrocannabinol: NOT DETECTED

## 2019-05-12 LAB — DIFFERENTIAL
Abs Immature Granulocytes: 0.04 10*3/uL (ref 0.00–0.07)
Basophils Absolute: 0 10*3/uL (ref 0.0–0.1)
Basophils Relative: 0 %
Eosinophils Absolute: 0 10*3/uL (ref 0.0–0.5)
Eosinophils Relative: 0 %
Immature Granulocytes: 0 %
Lymphocytes Relative: 17 %
Lymphs Abs: 1.6 10*3/uL (ref 0.7–4.0)
Monocytes Absolute: 0.8 10*3/uL (ref 0.1–1.0)
Monocytes Relative: 9 %
Neutro Abs: 6.9 10*3/uL (ref 1.7–7.7)
Neutrophils Relative %: 74 %

## 2019-05-12 LAB — CBG MONITORING, ED: Glucose-Capillary: 169 mg/dL — ABNORMAL HIGH (ref 70–99)

## 2019-05-12 LAB — PROTIME-INR
INR: 1.1 (ref 0.8–1.2)
Prothrombin Time: 13.6 seconds (ref 11.4–15.2)

## 2019-05-12 LAB — ETHANOL: Alcohol, Ethyl (B): <10 mg/dL (ref <10)

## 2019-05-12 LAB — APTT: aPTT: 26 seconds (ref 24–36)

## 2019-05-12 LAB — TSH: TSH: 0.333 u[IU]/mL — ABNORMAL LOW (ref 0.350–4.500)

## 2019-05-12 MED ORDER — ASPIRIN 325 MG PO TABS
325.0000 mg | ORAL_TABLET | Freq: Every day | ORAL | Status: DC
  Administered 2019-05-12 – 2019-05-13 (×2): 325 mg via ORAL
  Filled 2019-05-12 (×2): qty 1

## 2019-05-12 MED ORDER — LEVOTHYROXINE SODIUM 100 MCG PO TABS
100.0000 ug | ORAL_TABLET | Freq: Every day | ORAL | Status: DC
  Administered 2019-05-13: 100 ug via ORAL
  Filled 2019-05-12: qty 1

## 2019-05-12 MED ORDER — ASPIRIN EC 81 MG PO TBEC: 81.0000 mg | DELAYED_RELEASE_TABLET | Freq: Every day | ORAL | Status: DC

## 2019-05-12 MED ORDER — CLOPIDOGREL BISULFATE 75 MG PO TABS
75.0000 mg | ORAL_TABLET | Freq: Every day | ORAL | Status: DC
  Administered 2019-05-12 – 2019-05-13 (×2): 75 mg via ORAL
  Filled 2019-05-12 (×2): qty 1

## 2019-05-12 MED ORDER — ACETAMINOPHEN 650 MG RE SUPP: 650.0000 mg | RECTAL | Status: DC | PRN

## 2019-05-12 MED ORDER — ATORVASTATIN CALCIUM 80 MG PO TABS
80.0000 mg | ORAL_TABLET | Freq: Every day | ORAL | Status: DC
  Administered 2019-05-12: 80 mg via ORAL
  Filled 2019-05-12: qty 1

## 2019-05-12 MED ORDER — ACETAMINOPHEN 160 MG/5ML PO SOLN: 650.0000 mg | ORAL | Status: DC | PRN

## 2019-05-12 MED ORDER — IOHEXOL 350 MG/ML SOLN
100.0000 mL | Freq: Once | INTRAVENOUS | Status: AC | PRN
  Administered 2019-05-12: 100 mL via INTRAVENOUS

## 2019-05-12 MED ORDER — STROKE: EARLY STAGES OF RECOVERY BOOK
Freq: Once | Status: AC
  Administered 2019-05-12: 19:00:00
  Filled 2019-05-12: qty 1

## 2019-05-12 MED ORDER — ENOXAPARIN SODIUM 40 MG/0.4ML ~~LOC~~ SOLN
40.0000 mg | SUBCUTANEOUS | Status: DC
  Administered 2019-05-12: 40 mg via SUBCUTANEOUS
  Filled 2019-05-12: qty 0.4

## 2019-05-12 MED ORDER — ACETAMINOPHEN 325 MG PO TABS: 650.0000 mg | ORAL_TABLET | ORAL | Status: DC | PRN

## 2019-05-12 MED ORDER — DONEPEZIL HCL 5 MG PO TABS
5.0000 mg | ORAL_TABLET | Freq: Every day | ORAL | Status: DC
  Administered 2019-05-12: 5 mg via ORAL
  Filled 2019-05-12: qty 1

## 2019-05-12 MED ORDER — ESCITALOPRAM OXALATE 10 MG PO TABS
5.0000 mg | ORAL_TABLET | Freq: Every day | ORAL | Status: DC
  Administered 2019-05-12: 5 mg via ORAL
  Filled 2019-05-12: qty 1

## 2019-05-12 NOTE — ED Notes (Signed)
Pt returned from MRI °

## 2019-05-12 NOTE — H&P (Signed)
NAME:  Maria Clarke, MRN:  [id number], DOB:  1921/07/28, LOS: 0 ADMISSION DATE:  05/12/2019, Primary: [name], NP  CHIEF COMPLAINT:  Neurological changes  Medical Service: Internal Medicine Teaching Service         Attending Physician: Dr. Lucious Groves, DO    First Contact: Dr. Darrick Meigs Pager: [phone]  Second Contact: Dr. Koleen Distance Pager: [phone]       After Hours (After 5p/  First Contact Pager: [phone]  weekends / holidays): Second Contact Pager: [phone]    History of present illness   85 yo female with PMH of right carotid stenosis (60-79%), hypertension, hyperlipidemia, hypothyroidism, and dementia who presented to the ED for acute onset slurred speech and gait difficulty witnessed by her son. 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 She sat back down at that point and EMS was called. Sx appeared to have resolved prior to EMS arrival however when they attempted to stand her back up, her speech became slurred again. Systolic Blood pressure en route approx 200. On arrival to ED, stroke code was called with NIH score of 2.  Neurology spoke to patient's son who implied patient does have mild dementia with sundowning.   Past Medical History  She,  has a past medical history of Atrial tachycardia ([place]), Carotid artery disease ([place]), Depression, Hypercholesteremia, Hypertension, and Hypothyroidism.   Home Medications     Prior to Admission medications   Medication Sig Start Date End Date Taking? Authorizing Provider  amLODipine (NORVASC) 5 MG tablet Take 1 tablet (5 mg total) by mouth daily. 12/23/18  Yes Nafziger, Tommi Rumps, NP  aspirin 81 MG tablet Take 81 mg by mouth at bedtime.    Yes [provider]  cyanocobalamin (,VITAMIN B-12,) 1000 MCG/ML injection Inject 1 mL (1,000 mcg total) into the skin every 30 (thirty) days. 12/23/18  Yes Nafziger, Tommi Rumps, NP  diphenhydramine-acetaminophen (TYLENOL PM) 25-500 MG TABS tablet Take 1  tablet by mouth at bedtime.   Yes [provider]  donepezil (ARICEPT) 5 MG tablet Take 1 tablet (5 mg total) by mouth at bedtime. 04/18/18  Yes Dorena Cookey, MD  escitalopram (LEXAPRO) 5 MG tablet Take 1 tablet (5 mg total) by mouth at bedtime. 12/23/18  Yes Nafziger, Tommi Rumps, NP  levothyroxine (SYNTHROID) 100 MCG tablet Take 1 tablet (100 mcg total) by mouth daily before breakfast. 05/08/19  Yes Nafziger, Tommi Rumps, NP  methylPREDNISolone (MEDROL DOSEPAK) 4 MG TBPK tablet Take as directed Patient taking differently: Take 4-24 mg by mouth See admin instructions. Take 24 mg by mouth on day one, then decrease by 4 mg daily until finished 05/08/19  Yes Nafziger, Tommi Rumps, NP  metoprolol tartrate (LOPRESSOR) 50 MG tablet TAKE 1 TABLET BY MOUTH TWICE A DAY Patient taking differently: Take 50 mg by mouth 2 (two) times daily. TAKE 1 TABLET BY MOUTH TWICE A DAY 12/23/18  Yes Nafziger, Tommi Rumps, NP    Allergies    Allergies as of 05/12/2019 - Review Complete 05/12/2019  Allergen Reaction Noted  . Sulfonamide derivatives Other (See Comments) 02/14/2007    Social History   reports that she has never smoked. She has never used smokeless tobacco. She reports that she does not drink alcohol or use drugs.   Family History   Her family history includes Asthma in her sister; Diabetes in her mother and son; Heart disease in her brother, mother, and sister; Hyperlipidemia in her mother and sister; Hypertension in her mother and son.  ROS  Unable to be obtained due to patient's encephalopathy  Objective   Blood pressure (!) 196/78, pulse 71, temperature 97.7 F (36.5 C), temperature source Oral, resp. rate 18, weight 63.7 kg, SpO2 95 %.    No intake or output data in the 24 hours ending 05/12/19 1819 Filed Weights   05/12/19 1121  Weight: 63.7 kg    Examination: GENERAL: in no acute distress. Pleasantly confused HEENT: head atraumatic.  CARDIAC: heart RRR. No peripheral edema.  PULMONARY: acyanotic.  Lung sounds clear to auscultation. ABDOMEN: soft. Nontender to palpation.  Nondistended. No organomegaly appreciated. NEURO: alert and oriented to person and place. PERRL. EOMs intact. Tongue midline. Face symmetric. 5/5 strength of the bilateral upper and lower extremities. Able to perform rapid alternating hand movements. Finger to nose test intact. SKIN: no rash or lesions on limited exam  PSYCH: encephalopathic  Consults:  neurology  Significant Diagnostic Tests:  EKG: personally reviewed my interpretation is RBBB--chronic  CT head: ASPECTS 10. No acute abnormalities noted.  CTA head/neck: 65% stenosis of right internal carotid artery. Decreased conspicuity of right MCA branch vessels suggesting decreased perfusion however no specific cutoff noted. Occlusion of proximal left posterior cerebral artery with prominent collateral.  MRI brain diffusion only: no acute infarct. Extensive chronic small vessel ischemic disease. Moderate cerebral atrophy.  Labs    CBC Latest Ref Rng & Units 05/12/2019 05/12/2019 12/05/2016  WBC 4.0 - 10.5 K/uL - 9.5 7.0  Hemoglobin 12.0 - 15.0 g/dL 13.9 13.8 15.0  Hematocrit 36.0 - 46.0 % 41.0 42.4 43.8  Platelets 150 - 400 K/uL - 237 154.0   BMP Latest Ref Rng & Units 05/12/2019 05/12/2019 12/05/2016  Glucose 70 - 99 mg/dL 165(H) 172(H) 98  BUN 8 - 23 mg/dL 17 16 16   Creatinine 0.44 - 1.00 mg/dL 1.00 1.05(H) 0.73  Sodium 135 - 145 mmol/L 141 139 142  Potassium 3.5 - 5.1 mmol/L 3.3(L) 3.3(L) 4.0  Chloride 98 - 111 mmol/L 103 102 107  CO2 22 - 32 mmol/L - 23 27  Calcium 8.9 - 10.3 mg/dL - 9.5 10.0   TSH 9/24:  8.77 UA unremarkable Tox screen neg  Summary  85 yo female with PMH of hypertension, hyperlipidemia, atrial tachycardia and carotid stenosis presenting with transient episode of slurred speech and gait instability. No findings on imaging suggestive of stroke on admission.  Assessment & Plan:  Active Problems:   TIA (transient ischemic attack)   TIA. Brain imaging on admission neg for stroke. Neurology consulted. No abnormalities on PE aside from AMS however unclear how much of this is attributable to baseline dementia. Possibly related to hypertensive encephalopathy. Plan Asprin/plavix. lipitor 80mg  Echo A1C, lipid panel neurochecks q2h, Tele monitor Permissive htn 24-48hr goal: 140-220 SBP. After 24-48h, gradual reduction in BP with goal discharge BP >140/90. PT/OT/ST Please notify MD for worsening symtpoms  HTN: please see above for permissive htn goals HLD: 80mg  lipitor  Dementia: aricept Depression: lexapro  Hypothyroidism. Last TSH ~9. Plan: recheck TSH. Continue synthroid  Best practice:  CODE STATUS: FULL Diet: NPO DVT for prophylaxis: lovenox Social considerations/Family communication: will attempt to call son for further information Dispo: Admit patient to Inpatient with expected length of stay greater than 2 midnights.   Mitzi Hansen, MD INTERNAL MEDICINE RESIDENT PGY-1 05/12/19 6:19 PM

## 2019-05-12 NOTE — Code Documentation (Signed)
Pt arrived to bridge at 1052 via [place] with c/o slurred speech, and difficulty walking which began suddenly at 1030. EMS reported waxing and waning right sided leg weakness with standing and walking pt. Pt woke up this morning normal, and was with family all morning. Stroke team met pt at the bridge, and pt was NIHSS of one, for mild aphasia. Pt taken to CT, CT and CTA completed. Ptt not TPA candidate at this time due to too mild to treat, but remains inside TPA window until 1500, and IR window until 05/13/2019 at 1030. Handoff given to [place], [place]. Pt will have q 15 min vitals and q 30 min MNIHSS until 1500 today.

## 2019-05-12 NOTE — ED Notes (Signed)
Pt up and inquiring about time. This RN informed pt that it was 10:35pm and that it was time to go to sleep. Pt asked how long her expected stay would be and this RN informed pt that it would be at least overnight, perhaps 2 nights. Pt asked when the night would be over, this RN informed pt that night would be over in the morning and that answer seemed to satisfy pt. Pt laid down and said she was going to go to sleep. Will continue to monitor.

## 2019-05-12 NOTE — ED Notes (Signed)
This RN went in to check on pt and found pt up and holding computer mouse, saying that she was looking for her luggage, saying that her son took it. This RN got pt back into bed, got vital signs and performed Modified NIH scale on her. Pt could not answer any LOC questions correctly, Modified NIH score was a 2. Will continue to monitor and assess.

## 2019-05-12 NOTE — ED Notes (Signed)
Pt on phone with son

## 2019-05-12 NOTE — Telephone Encounter (Signed)
Copied from [place] [id number]. Topic: General - Other >> May 12, 2019 11:18 AM Alanda Slim E wrote: Reason for CRM: Takai Caul called to inform Tommi Rumps and the nurse that the Pt was taken to the hospital as an [facility]

## 2019-05-12 NOTE — ED Notes (Addendum)
This RN gave pt all PM meds. Pt asked what time it was and this RN told her it was 2107. Pt mentioned not having eaten & this RN asked if she would like something to eat since she'd passed the swallow study. Pt refused, stating she'd rather go to bed since it was so late. This RN said pt could go to sleep after "quick neuro assessment." Will assess and continue to monitor.

## 2019-05-12 NOTE — ED Provider Notes (Signed)
[name] EMERGENCY DEPARTMENT Provider Note   CSN: [id number] Arrival date & time: 05/12/19  1052  An emergency department physician performed an initial assessment on this suspected stroke patient at 1054.  History   Chief Complaint Chief Complaint  Patient presents with   Code Stroke    HPI Maria Clarke is a 85 y.o. female.     The history is provided by the patient, medical records and the EMS personnel. No language interpreter was used.  Neurologic Problem This is a new problem. The current episode started 1 to 2 hours ago. The problem occurs constantly. The problem has been resolved. Pertinent negatives include no chest pain, no abdominal pain, no headaches and no shortness of breath. The symptoms are aggravated by standing. Nothing relieves the symptoms. She has tried acetaminophen for the symptoms.    Past Medical History:  Diagnosis Date   Atrial tachycardia ([place])    a. Noted 02/2013 on EKG.   Carotid artery disease ([facility])    Carotid duplex showed [id number] RICA and [id number] LICA   Depression    Hypercholesteremia    Hypertension    Hypothyroidism     Patient Active Problem List   Diagnosis Date Noted   Cognitive impairment 10/29/2018   Shingles outbreak 05/25/2015   Carotid stenosis 04/20/2014   Memory loss, short term 08/13/2013   Occlusion and stenosis of carotid artery without mention of cerebral infarction 04/20/2013   Carotid bruit 03/05/2013   Hearing loss of both ears 11/10/2012   EDEMA- LOCALIZED 12/15/2009   Hypothyroidism 12/11/2007   Depression, recurrent ([place]) 12/11/2007   Essential hypertension 12/11/2007   HEMATURIA 02/14/2007    Past Surgical History:  Procedure Laterality Date   BREAST SURGERY Left    Benign Tumor- several years ago   CESAREAN SECTION     EYE SURGERY Bilateral    cataract     OB History   No obstetric history on file.      Home Medications    Prior to Admission  medications   Medication Sig Start Date End Date Taking? Authorizing Provider  amLODipine (NORVASC) 5 MG tablet Take 1 tablet (5 mg total) by mouth daily. 12/23/18   Nafziger, Tommi Rumps, NP  aspirin 81 MG tablet Take 81 mg by mouth daily.    [provider]  cyanocobalamin (,VITAMIN B-12,) 1000 MCG/ML injection Inject 1 mL (1,000 mcg total) into the skin every 30 (thirty) days. 12/23/18   Nafziger, Tommi Rumps, NP  donepezil (ARICEPT) 5 MG tablet Take 1 tablet (5 mg total) by mouth at bedtime. 04/18/18   Dorena Cookey, MD  escitalopram (LEXAPRO) 5 MG tablet Take 1 tablet (5 mg total) by mouth at bedtime. 12/23/18   Nafziger, Tommi Rumps, NP  levothyroxine (SYNTHROID) 100 MCG tablet Take 1 tablet (100 mcg total) by mouth daily before breakfast. 05/08/19   Nafziger, Tommi Rumps, NP  methylPREDNISolone (MEDROL DOSEPAK) 4 MG TBPK tablet Take as directed 05/08/19   [name], NP  metoprolol tartrate (LOPRESSOR) 50 MG tablet TAKE 1 TABLET BY MOUTH TWICE A DAY 12/23/18   Nafziger, Tommi Rumps, NP    Family History Family History  Problem Relation Age of Onset   Diabetes Mother    Heart disease Mother        After age 83   Hyperlipidemia Mother    Hypertension Mother    Heart disease Sister        After age 82   Asthma Sister    Hyperlipidemia Sister  Heart disease Brother        After age 74   Diabetes Son    Hypertension Son     Social History Social History   Tobacco Use   Smoking status: Never Smoker   Smokeless tobacco: Never Used  Substance Use Topics   Alcohol use: No   Drug use: No     Allergies   Other and Sulfonamide derivatives   Review of Systems Review of Systems  Constitutional: Negative for chills, diaphoresis, fatigue and fever.  HENT: Negative for ear pain and sore throat.   Eyes: Negative for pain and visual disturbance.  Respiratory: Negative for cough and shortness of breath.   Cardiovascular: Negative for chest pain, palpitations and leg swelling.    Gastrointestinal: Negative for abdominal pain, constipation, diarrhea, nausea and vomiting.  Genitourinary: Negative for dysuria, flank pain, frequency and hematuria.  Musculoskeletal: Negative for arthralgias, back pain, neck pain and neck stiffness.  Skin: Negative for color change, rash and wound.  Neurological: Positive for speech difficulty and weakness. Negative for dizziness, seizures, syncope, facial asymmetry, light-headedness and headaches.  Psychiatric/Behavioral: Negative for agitation and confusion.  All other systems reviewed and are negative.    Physical Exam Updated Vital Signs BP (!) 178/71    Pulse 63    Temp 97.7 F (36.5 C) (Oral)    Resp 18    Wt 63.7 kg    SpO2 95%    BMI 27.43 kg/m   Physical Exam Vitals signs and nursing note reviewed.  Constitutional:      General: She is not in acute distress.    Appearance: She is well-developed. She is not ill-appearing, toxic-appearing or diaphoretic.  HENT:     Head: Normocephalic and atraumatic.     Right Ear: External ear normal.     Left Ear: External ear normal.     Nose: Nose normal. No congestion or rhinorrhea.     Mouth/Throat:     Mouth: Mucous membranes are moist.     Pharynx: No oropharyngeal exudate.  Eyes:     Conjunctiva/sclera: Conjunctivae normal.     Pupils: Pupils are equal, round, and reactive to light.  Neck:     Musculoskeletal: Normal range of motion and neck supple. No muscular tenderness.  Cardiovascular:     Rate and Rhythm: Normal rate.     Pulses: Normal pulses.     Heart sounds: No murmur.  Pulmonary:     Effort: Pulmonary effort is normal. No respiratory distress.     Breath sounds: No stridor. No wheezing, rhonchi or rales.  Chest:     Chest wall: No tenderness.  Abdominal:     General: There is no distension.     Tenderness: There is no abdominal tenderness. There is no rebound.  Musculoskeletal:        General: No tenderness.  Skin:    General: Skin is warm.     Capillary  Refill: Capillary refill takes less than 2 seconds.     Findings: No erythema or rash.  Neurological:     General: No focal deficit present.     Mental Status: She is alert.     Sensory: No sensory deficit.     Motor: No weakness or abnormal muscle tone.     Coordination: Coordination normal.     Deep Tendon Reflexes: Reflexes are normal and symmetric.  Psychiatric:        Mood and Affect: Mood normal.      ED Treatments /  Results  Labs (all labs ordered are listed, but only abnormal results are displayed) Labs Reviewed  COMPREHENSIVE METABOLIC PANEL - Abnormal; Notable for the following components:      Result Value   Potassium 3.3 (*)    Glucose, Bld 172 (*)    Creatinine, Ser 1.05 (*)    Total Protein 6.2 (*)    Albumin 3.3 (*)    GFR calc non Af Amer 45 (*)    GFR calc Af Amer 52 (*)    All other components within normal limits  I-STAT CHEM 8, ED - Abnormal; Notable for the following components:   Potassium 3.3 (*)    Glucose, Bld 165 (*)    All other components within normal limits  CBG MONITORING, ED - Abnormal; Notable for the following components:   Glucose-Capillary 169 (*)    All other components within normal limits  ETHANOL  PROTIME-INR  APTT  CBC  DIFFERENTIAL  RAPID URINE DRUG SCREEN, HOSP PERFORMED  URINALYSIS, ROUTINE W REFLEX MICROSCOPIC    EKG EKG Interpretation  Date/Time:  Tuesday May 12 2019 11:25:31 EST Ventricular Rate:  65 PR Interval:    QRS Duration: 140 QT Interval:  476 QTC Calculation: 495 R Axis:   32 Text Interpretation: Sinus rhythm Right bundle branch block When compared to prior, no significant cahnges seen. No STEMI Confirmed by Antony Blackbird [phone]) on 05/12/2019 12:37:59 PM   Radiology Ct Angio Head W Or Wo Contrast  Result Date: 05/12/2019 CLINICAL DATA:  Dysarthria and ataxia. EXAM: CT ANGIOGRAPHY HEAD AND NECK TECHNIQUE: Multidetector CT imaging of the head and neck was performed using the standard protocol  during bolus administration of intravenous contrast. Multiplanar CT image reconstructions and MIPs were obtained to evaluate the vascular anatomy. Carotid stenosis measurements (when applicable) are obtained utilizing NASCET criteria, using the distal internal carotid diameter as the denominator. CONTRAST:  179mL OMNIPAQUE IOHEXOL 350 MG/ML SOLN COMPARISON:  CT head without contrast 05/12/2019. FINDINGS: CTA NECK FINDINGS Aortic arch: Atherosclerotic calcifications are present in the aortic arch. There is mild narrowing at the origin of the left subclavian artery. A 3 vessel arch configuration is present. There is no significant stenosis of the great vessel origins. Right carotid system: Right common carotid artery is within limits. Prominent calcified and noncalcified plaque is present at the right carotid bifurcation. The lumen is narrowed to 1.5 mm. This compares with a more distal measurement of 4.3 mm. There is some tortuosity of the cervical right ICA without significant stenosis. Left carotid system: The left common carotid artery is within normal limits. Atherosclerotic calcifications are present at the bifurcation without significant stenosis. The cervical left ICA is otherwise normal. Vertebral arteries: The vertebral arteries are codominant. There is some calcification at the origin of both vertebral arteries at the subclavian arteries without significant stenosis. There is no significant stenosis of either vertebral artery in the neck. Skeleton: Degenerative changes are present in the cervical spine. Vertebral body heights are maintained. No focal lytic or blastic lesions are present. Other neck: Heterogeneous thyroid is present without a dominant nodule. No significant adenopathy. Salivary glands are within normal limits. No focal mucosal or submucosal lesions are present. Upper chest: Lung apices are clear without focal nodule, mass, or airspace disease. Thoracic inlet is normal. Review of the MIP  images confirms the above findings CTA HEAD FINDINGS Anterior circulation: Atherosclerotic calcifications are present in the cavernous internal carotid arteries bilaterally without significant stenosis through the ICA termini. The A1 segments are within  normal limits. Anterior communicating artery is patent. There is moderate narrowing of the distal right M1 segment, just proximal to the bifurcation. The left M1 segment is normal. There is moderate stenosis in the pericallosal arteries bilaterally. The MCA bifurcations are intact. There is segmental narrowing within MCA branch vessels bilaterally without a significant proximal stenosis or occlusion. Branch vessels are more robust left than right. This suggests some degree decreased perfusion to the right MCA branches. Posterior circulation: The vertebral arteries are codominant. PICA origins are visualized and normal. The basilar artery is normal. Both posterior cerebral arteries originate basilar tip. There is a high-grade stenosis in the proximal right P2 segment. The left PCA is occluded proximally with extensive collaterals. This may be chronic. Venous sinuses: Dural sinuses are patent. The left transverse sinus is dominant. The straight sinus deep cerebral veins are intact. Cortical veins are unremarkable. Anatomic variants: None Review of the MIP images confirms the above findings IMPRESSION: 1. 65% stenosis of the right internal carotid artery with mixed calcified and noncalcified plaque. 2. Moderate distal right M1 segment stenosis. 3. Decreased conspicuity of right MCA branch vessels compared to the left, suggesting decreased perfusion. 4. Occlusion of the proximal left posterior cerebral artery with prominent collaterals. This may be a chronic occlusion. 5. High-grade stenosis of the proximal right P2 segment with significant collaterals. 6. Additional atherosclerotic changes at the aortic arch left carotid bifurcation, vertebral artery origins, and  bilateral cavernous internal carotid arteries without other significant stenosis. Aortic Atherosclerosis (ICD10-I70.0). These results were called by telephone at the time of interpretation on 05/12/2019 at 11:38 am to provider [facility] , who verbally acknowledged these results. Electronically Signed   By: San Morelle M.D.   On: 05/12/2019 11:49   Ct Angio Neck W Or Wo Contrast  Result Date: 05/12/2019 CLINICAL DATA:  Dysarthria and ataxia. EXAM: CT ANGIOGRAPHY HEAD AND NECK TECHNIQUE: Multidetector CT imaging of the head and neck was performed using the standard protocol during bolus administration of intravenous contrast. Multiplanar CT image reconstructions and MIPs were obtained to evaluate the vascular anatomy. Carotid stenosis measurements (when applicable) are obtained utilizing NASCET criteria, using the distal internal carotid diameter as the denominator. CONTRAST:  168mL OMNIPAQUE IOHEXOL 350 MG/ML SOLN COMPARISON:  CT head without contrast 05/12/2019. FINDINGS: CTA NECK FINDINGS Aortic arch: Atherosclerotic calcifications are present in the aortic arch. There is mild narrowing at the origin of the left subclavian artery. A 3 vessel arch configuration is present. There is no significant stenosis of the great vessel origins. Right carotid system: Right common carotid artery is within limits. Prominent calcified and noncalcified plaque is present at the right carotid bifurcation. The lumen is narrowed to 1.5 mm. This compares with a more distal measurement of 4.3 mm. There is some tortuosity of the cervical right ICA without significant stenosis. Left carotid system: The left common carotid artery is within normal limits. Atherosclerotic calcifications are present at the bifurcation without significant stenosis. The cervical left ICA is otherwise normal. Vertebral arteries: The vertebral arteries are codominant. There is some calcification at the origin of both vertebral arteries at the  subclavian arteries without significant stenosis. There is no significant stenosis of either vertebral artery in the neck. Skeleton: Degenerative changes are present in the cervical spine. Vertebral body heights are maintained. No focal lytic or blastic lesions are present. Other neck: Heterogeneous thyroid is present without a dominant nodule. No significant adenopathy. Salivary glands are within normal limits. No focal mucosal or submucosal lesions are  present. Upper chest: Lung apices are clear without focal nodule, mass, or airspace disease. Thoracic inlet is normal. Review of the MIP images confirms the above findings CTA HEAD FINDINGS Anterior circulation: Atherosclerotic calcifications are present in the cavernous internal carotid arteries bilaterally without significant stenosis through the ICA termini. The A1 segments are within normal limits. Anterior communicating artery is patent. There is moderate narrowing of the distal right M1 segment, just proximal to the bifurcation. The left M1 segment is normal. There is moderate stenosis in the pericallosal arteries bilaterally. The MCA bifurcations are intact. There is segmental narrowing within MCA branch vessels bilaterally without a significant proximal stenosis or occlusion. Branch vessels are more robust left than right. This suggests some degree decreased perfusion to the right MCA branches. Posterior circulation: The vertebral arteries are codominant. PICA origins are visualized and normal. The basilar artery is normal. Both posterior cerebral arteries originate basilar tip. There is a high-grade stenosis in the proximal right P2 segment. The left PCA is occluded proximally with extensive collaterals. This may be chronic. Venous sinuses: Dural sinuses are patent. The left transverse sinus is dominant. The straight sinus deep cerebral veins are intact. Cortical veins are unremarkable. Anatomic variants: None Review of the MIP images confirms the above  findings IMPRESSION: 1. 65% stenosis of the right internal carotid artery with mixed calcified and noncalcified plaque. 2. Moderate distal right M1 segment stenosis. 3. Decreased conspicuity of right MCA branch vessels compared to the left, suggesting decreased perfusion. 4. Occlusion of the proximal left posterior cerebral artery with prominent collaterals. This may be a chronic occlusion. 5. High-grade stenosis of the proximal right P2 segment with significant collaterals. 6. Additional atherosclerotic changes at the aortic arch left carotid bifurcation, vertebral artery origins, and bilateral cavernous internal carotid arteries without other significant stenosis. Aortic Atherosclerosis (ICD10-I70.0). These results were called by telephone at the time of interpretation on 05/12/2019 at 11:38 am to provider [facility] , who verbally acknowledged these results. Electronically Signed   By: San Morelle M.D.   On: 05/12/2019 11:49   Ct Head Code Stroke Wo Contrast  Result Date: 05/12/2019 CLINICAL DATA:  Code stroke. Acute onset of right-sided weakness and slurred speech. Last seen well 30 minutes ago. Ataxia, stroke suspected. EXAM: CT HEAD WITHOUT CONTRAST TECHNIQUE: Contiguous axial images were obtained from the base of the skull through the vertex without intravenous contrast. COMPARISON:  None. FINDINGS: Brain: Advanced atrophy and white matter disease is present. No acute cortical infarct is present. There is no acute hemorrhage or mass lesion. Basal ganglia are intact. Insular ribbon is normal. The ventricles are proportionate to the degree of atrophy. No significant extraaxial fluid collection is present. The brainstem and cerebellum are within normal limits. Vascular: Extensive atherosclerotic changes are present within the cavernous internal carotid arteries and at the dural margin of the left vertebral artery. There is no hyperdense vessel. Skull: Calvarium is intact. No focal lytic or blastic  lesions are present. Sinuses/Orbits: The paranasal sinuses and mastoid air cells are clear. Bilateral lens replacements are noted. Globes and orbits are otherwise unremarkable. ASPECTS [facility] Stroke Program Early CT Score) - Ganglionic level infarction (caudate, lentiform nuclei, internal capsule, insula, M1-M3 cortex): 7/7 - Supraganglionic infarction (M4-M6 cortex): 3/3 Total score (0-10 with 10 being normal): 10/10 IMPRESSION: 1. No acute intracranial abnormality. 2. Advanced atrophy and diffuse white matter disease likely reflects the sequela of chronic microvascular ischemia. 3. Atherosclerosis 4. ASPECTS is 10/10 The above was relayed via text pager to  Dr. Rory Percy on 05/12/2019 at 11:11 . Electronically Signed   By: San Morelle M.D.   On: 05/12/2019 11:11    Procedures Procedures (including critical care time)  Medications Ordered in ED Medications  iohexol (OMNIPAQUE) 350 MG/ML injection 100 mL (100 mLs Intravenous Contrast Given 05/12/19 1122)     Initial Impression / Assessment and Plan / ED Course  I have reviewed the triage vital signs and the nursing notes.  Pertinent labs & imaging results that were available during my care of the patient were reviewed by me and considered in my medical decision making (see chart for details).        Maria Clarke is a 85 y.o. female with a past medical history sniffing for hypertension, hypothyroidism, and carotid disease who presents as a code stroke for transient speech difficulties and right leg weakness.  According to family report to EMS, patient had was trying to walk and was dragging her right leg and have difficulty speaking.  This came and went several times.  She has never had this before.  Patient is reportedly on steroids for sciatica.  Symptoms began at 10:30 AM this morning.  On arrival, patient was protecting her airway and was felt to be stable for CT scan.  Patient was moving all extremities normally and I did not find  any focal neurologic deficits on my initial exam.  Lungs were clear and chest and abdomen were nontender.  Patient still feels that her speech is slower than normal but she is not slurring speech.  Patient had CT and CTA of her head showing the carotid stenosis.  No acute stroke seen initially.  Neurology recommended admission to medicine for MRI and further stroke versus TIA work-up.  Medicine team will be called for admission.   Final Clinical Impressions(s) / ED Diagnoses   Final diagnoses:  TIA (transient ischemic attack)    ED Discharge Orders    None      Clinical Impression: 1. TIA (transient ischemic attack)     Disposition: Admit  This note was prepared with assistance of Dragon voice recognition software. Occasional wrong-word or sound-a-like substitutions may have occurred due to the inherent limitations of voice recognition software.     Jayliani Wanner, Gwenyth Allegra, MD 05/12/19 [phone]

## 2019-05-12 NOTE — Consult Note (Signed)
Neurology Consultation  Reason for Consult: Code stroke for slurred speech and gait difficulty Referring Physician: Dr. Elnora Morrison  CC: Slurred speech and gait difficulty  History is obtained from: EMS, patient's son at bedside, chart  HPI: Maria Clarke is a 85 y.o. female past medical history of atrial tachycardia but no atrial fibrillation, right internal carotid stenosis by carotid ultrasound 60 to 79% and left carotid stenosis 1 to 39%, hypertension, hypercholesterolemia brought to the emergency room for evaluation of sudden onset of slurred speech and difficulty walking. The patient also has a history of dementia but lives alone in her own house and is able to take care of ADLs independently. This morning, like every morning, she was in the [place] [employer] [place] and when she attempted to get up to chair and walk she was swerving to the right and her speech appeared slurred to her son who was at her side.  She sat down back in the chair and symptoms seem to resolve.  EMS was called because of sudden onset of the symptoms.  On initial evaluation, they did not note any focal symptoms but when they stood her up to get her to walk, her speech became slurred.  They activated a code stroke in the field and brought her for an emergent stroke evaluation. Initial NIH stroke scale in the ED, when she was on a gurney was noted to be 0 or maybe a 1 for dysarthria but afterwards while doing the full NIH stroke scale with cards, she did exhibit some word finding difficulty.  NIH score was upgraded to 2.  The son reports that her dementia is mild but she does exhibit sundowning and is confused at times at nights in her day and night sleep cycles are not very good. Denies any knowledge of abnormal heart rhythm or atrial fibrillation although the record says atrial tachycardia was noted at some point but there is no mention of atrial fibrillation.  Initial blood pressures by EMS x1 was systolic  [id number].  Son says pressures usually remain high.  Son reports compliance to medications  LKW: 1030 05/12/2019 tpa given?: no, low NIH stroke scale Premorbid modified Rankin scale (mRS): 1 ROS: ROS was performed and is negative except as noted in the HPI.    Past Medical History:  Diagnosis Date  . Atrial tachycardia ([place])    a. Noted 02/2013 on EKG.  . Carotid artery disease ([place])    Carotid duplex showed [id number] RICA and 123456 LICA  . Depression   . Hypercholesteremia   . Hypertension   . Hypothyroidism     Family History  Problem Relation Age of Onset  . Diabetes Mother   . Heart disease Mother        After age 51  . Hyperlipidemia Mother   . Hypertension Mother   . Heart disease Sister        After age 21  . Asthma Sister   . Hyperlipidemia Sister   . Heart disease Brother        After age 26  . Diabetes Son   . Hypertension Son     Social History:   reports that she has never smoked. She has never used smokeless tobacco. She reports that she does not drink alcohol or use drugs.  Medications No current facility-administered medications for this encounter.   Current Outpatient Medications:  .  amLODipine (NORVASC) 5 MG tablet, Take 1 tablet (5 mg total) by mouth daily., Disp: 90 tablet,  Rfl: 1 .  aspirin 81 MG tablet, Take 81 mg by mouth daily., Disp: , Rfl:  .  cyanocobalamin (,VITAMIN B-12,) 1000 MCG/ML injection, Inject 1 mL (1,000 mcg total) into the skin every 30 (thirty) days., Disp: 3 mL, Rfl: 1 .  donepezil (ARICEPT) 5 MG tablet, Take 1 tablet (5 mg total) by mouth at bedtime., Disp: 90 tablet, Rfl: 4 .  escitalopram (LEXAPRO) 5 MG tablet, Take 1 tablet (5 mg total) by mouth at bedtime., Disp: 90 tablet, Rfl: 1 .  levothyroxine (SYNTHROID) 100 MCG tablet, Take 1 tablet (100 mcg total) by mouth daily before breakfast., Disp: 30 tablet, Rfl: 0 .  methylPREDNISolone (MEDROL DOSEPAK) 4 MG TBPK tablet, Take as directed, Disp: 21 tablet, Rfl: 0 .  metoprolol tartrate  (LOPRESSOR) 50 MG tablet, TAKE 1 TABLET BY MOUTH TWICE A DAY, Disp: 180 tablet, Rfl: 1  Exam: Current vital signs: BP (!) 185/65   Pulse 64   Temp 97.7 F (36.5 C) (Oral)   Resp 17   Wt 63.7 kg   SpO2 97%   BMI 27.43 kg/m  Vital signs in last 24 hours: Temp:  [97.7 F (36.5 C)] 97.7 F (36.5 C) (11/03 1137) Pulse Rate:  [64-65] 64 (11/03 1130) Resp:  [12-17] 17 (11/03 1130) BP: (181-185)/(65-72) 185/65 (11/03 1130) SpO2:  [97 %] 97 % (11/03 1130) Weight:  [63.7 kg] 63.7 kg (11/03 1121) General exam: Patient is awake alert in no distress HEENT: Normocephalic atraumatic, dry oral mucous membranes. Cardiovascular: Regular rate rhythm Respiratory: Breathing well saturating normally on room air Extremities: Warm well perfused with no edema Abdomen: Nondistended nontender Neurological exam She is awake alert oriented x3 but was not able to tell me the correct year. She was able to name simple objects such as her thumb, knuckles, wristwatch but had difficulty explaining the NIH stroke card picture completely. Speech appeared mildly dysarthric, and she subjectively also feels that she is talking a little slow. Cranial nerves: Pupils are equal round reactive light, extraocular movements are intact, visual fields are full, facial sensation intact bilaterally, face is symmetric, auditory acuity is reduced bilaterally, tongue and palate are midline. Motor exam: There is no drift in any of the 4 extremities and there is symmetric 5/5 strength. Cerebellar exam: No evidence of dysmetria on upper or lower extremities with HKS or FNF. Gait testing: Slightly cautious gait but says that is normal for her.  I was not able to reproduce any worsening of symptoms with walking as had been described by EMS.  NIHSS 2   Labs I have reviewed labs in epic and the results pertinent to this consultation are:  CBC    Component Value Date/Time   WBC 7.0 12/05/2016 1056   RBC 4.94 12/05/2016 1056   HGB  13.9 05/12/2019 1059   HCT 41.0 05/12/2019 1059   PLT 154.0 12/05/2016 1056   MCV 88.8 12/05/2016 1056   MCH 30.8 03/05/2013 1422   MCHC 34.2 12/05/2016 1056   RDW 14.6 12/05/2016 1056   LYMPHSABS 2.0 12/05/2016 1056   MONOABS 0.4 12/05/2016 1056   EOSABS 0.2 12/05/2016 1056   BASOSABS 0.0 12/05/2016 1056    CMP     Component Value Date/Time   NA 141 05/12/2019 1059   K 3.3 (L) 05/12/2019 1059   CL 103 05/12/2019 1059   CO2 27 12/05/2016 1056   GLUCOSE 165 (H) 05/12/2019 1059   BUN 17 05/12/2019 1059   CREATININE 1.00 05/12/2019 1059   CALCIUM 10.0 12/05/2016  1056   PROT 7.3 03/05/2013 1422   ALBUMIN 3.8 03/05/2013 1422   AST 19 03/05/2013 1422   ALT 9 03/05/2013 1422   ALKPHOS 50 03/05/2013 1422   BILITOT 0.4 03/05/2013 1422   GFRNONAA 75 (L) 03/07/2013 0555   GFRAA 87 (L) 03/07/2013 0555   Imaging I have reviewed the images obtained:  CT-scan of the brain aspects of 10.  No bleed.  Advanced white matter disease CTA head and neck showed multiple significant findings.  There is 65% stenosis of the right internal carotid artery with mixed calcified and noncalcified plaque.  There is moderate distal right M1 segment stenosis.  There is also decreased conspicuity of right MCA branch vessels compared to the left suggesting decreased perfusion but no obvious cutoff.  There is occlusion of the proximal left PCA with prominent collaterals, suggesting this might be a chronic occlusion.  There is high-grade stenosis of the proximal right P2 segment with significant collaterals.  There is also atherosclerotic changes in the aortic arch, left carotid bifurcation, vertebral artery origins and bilateral cavernous ICAs without other significant stenosis.  Assessment:  85 year old woman brought in for evaluation of slurred speech and gait abnormality-swerving to the right while walking.  Reportedly speech symptoms got worse when she stood and walked and got better or resolved when she sat  down per EMS. I was not able to reproduce worsening of symptoms with walking when I attempted to walk her. She does have some word finding difficulty as well as some mild dysarthria. A low NIH stroke scale of 2 precluded the use of TPA. There does appear to be multiple cervical and intracranial stenoses/occlusions-some of which are chronic as listed above with the most important ones are 65% stenosis of the right ICA and moderate distal right M1 stenosis which might be hypoperfusing the right side of her brain.  Her symptoms were very nonlocalizable, and a low stroke scale precludes her from being a candidate for any immediate intervention. She does however warrant a full stroke work-up and possible evaluation for revascularization of the right carotid based on test results.   I discussed the plan in detail with her son at bedside.  Impression: Evaluate for acute ischemic stroke  Recommendations: -Admit to hospitalist -stepdown -Frequent neurochecks-every 2 hours -Telemetry monitoring -Allow for permissive hypertension - for her, given the multiple cervical and intracranial stenosis, would keep the pressures on the higher side for the first 24 to 48 hours-I would keep the goal blood pressure between 140-220.  Do not treat high blood pressures unless systolic goes above [id number]. Gradual reduction of blood pressure and a goal discharge blood pressure should not be less than 140/90. -MRI brain without contrast -Echocardiogram -HgbA1c, fasting lipid panel -Prophylactic therapy-dual antiplatelets-aspirin 325 and Plavix 75 daily. -Atorvastatin 80 mg PO daily -Risk factor modification -PT consult, OT consult, Speech consult -If Afib found on telemetry, will need anticoagulation for now okay for dual antiplatelets.  Final decision pending imaging, telemetry results as well as stroke team rounding.  The patient remains in the window for emergent intervention should her symptoms worsen till 10:30 AM on  05/13/2019.  Please page neurology with any questions or concerns should her neurological exam worsen.  D/W Dr. Sherry Ruffing in the ER -- Amie Portland, MD [facility] Pager: [phone] If 7pm to 7am, please call on call as listed on AMION.

## 2019-05-12 NOTE — ED Notes (Signed)
Pt ambulated to RR .  

## 2019-05-12 NOTE — ED Notes (Addendum)
Son speaking with pharmacy tech with regard to home medications

## 2019-05-12 NOTE — ED Notes (Signed)
Pt resting.

## 2019-05-12 NOTE — ED Notes (Signed)
Patient transported to MRI 

## 2019-05-13 ENCOUNTER — Observation Stay (HOSPITAL_BASED_OUTPATIENT_CLINIC_OR_DEPARTMENT_OTHER): Payer: Medicare Other

## 2019-05-13 DIAGNOSIS — F329 Major depressive disorder, single episode, unspecified: Secondary | ICD-10-CM | POA: Diagnosis not present

## 2019-05-13 DIAGNOSIS — R2689 Other abnormalities of gait and mobility: Secondary | ICD-10-CM | POA: Diagnosis not present

## 2019-05-13 DIAGNOSIS — F0280 Dementia in other diseases classified elsewhere without behavioral disturbance: Secondary | ICD-10-CM | POA: Diagnosis not present

## 2019-05-13 DIAGNOSIS — Z7982 Long term (current) use of aspirin: Secondary | ICD-10-CM

## 2019-05-13 DIAGNOSIS — G301 Alzheimer's disease with late onset: Secondary | ICD-10-CM | POA: Diagnosis not present

## 2019-05-13 DIAGNOSIS — E785 Hyperlipidemia, unspecified: Secondary | ICD-10-CM | POA: Diagnosis not present

## 2019-05-13 DIAGNOSIS — E039 Hypothyroidism, unspecified: Secondary | ICD-10-CM

## 2019-05-13 DIAGNOSIS — Z79890 Hormone replacement therapy (postmenopausal): Secondary | ICD-10-CM | POA: Diagnosis not present

## 2019-05-13 DIAGNOSIS — I6521 Occlusion and stenosis of right carotid artery: Secondary | ICD-10-CM | POA: Diagnosis not present

## 2019-05-13 DIAGNOSIS — G9340 Encephalopathy, unspecified: Secondary | ICD-10-CM

## 2019-05-13 DIAGNOSIS — G459 Transient cerebral ischemic attack, unspecified: Secondary | ICD-10-CM

## 2019-05-13 DIAGNOSIS — Z7902 Long term (current) use of antithrombotics/antiplatelets: Secondary | ICD-10-CM | POA: Diagnosis not present

## 2019-05-13 DIAGNOSIS — F0390 Unspecified dementia without behavioral disturbance: Secondary | ICD-10-CM | POA: Diagnosis not present

## 2019-05-13 DIAGNOSIS — I10 Essential (primary) hypertension: Secondary | ICD-10-CM

## 2019-05-13 DIAGNOSIS — Z79899 Other long term (current) drug therapy: Secondary | ICD-10-CM | POA: Diagnosis not present

## 2019-05-13 DIAGNOSIS — Z882 Allergy status to sulfonamides status: Secondary | ICD-10-CM

## 2019-05-13 LAB — LIPID PANEL
Cholesterol: 265 mg/dL — ABNORMAL HIGH (ref 0–200)
HDL: 65 mg/dL (ref >40)
LDL Cholesterol: 174 mg/dL — ABNORMAL HIGH (ref 0–99)
Total CHOL/HDL Ratio: 4.1 RATIO
Triglycerides: 128 mg/dL (ref <150)
VLDL: 26 mg/dL (ref 0–40)

## 2019-05-13 LAB — SARS CORONAVIRUS 2 (TAT 6-24 HRS): SARS Coronavirus 2: NEGATIVE

## 2019-05-13 LAB — HEMOGLOBIN A1C
Hgb A1c MFr Bld: 5.8 % — ABNORMAL HIGH (ref 4.8–5.6)
Mean Plasma Glucose: 119.76 mg/dL

## 2019-05-13 LAB — ECHOCARDIOGRAM COMPLETE
Height: 60 in
Weight: 2246.93 oz

## 2019-05-13 LAB — CBG MONITORING, ED: Glucose-Capillary: 116 mg/dL — ABNORMAL HIGH (ref 70–99)

## 2019-05-13 MED ORDER — ATORVASTATIN CALCIUM 80 MG PO TABS: 80.0000 mg | ORAL_TABLET | Freq: Every day | ORAL | 0 refills | Status: DC

## 2019-05-13 MED ORDER — CLOPIDOGREL BISULFATE 75 MG PO TABS: 75.0000 mg | ORAL_TABLET | Freq: Every day | ORAL | 0 refills | Status: DC

## 2019-05-13 MED ORDER — ASPIRIN 325 MG PO TABS: 325.0000 mg | ORAL_TABLET | Freq: Every day | ORAL | 0 refills | Status: DC

## 2019-05-13 MED ORDER — POTASSIUM CHLORIDE 20 MEQ PO PACK
40.0000 meq | PACK | Freq: Once | ORAL | Status: AC
  Administered 2019-05-13: 40 meq via ORAL
  Filled 2019-05-13: qty 2

## 2019-05-13 NOTE — TOC Transition Note (Signed)
Transition of Care [facility]) - CM/SW Discharge Note   Patient Details  Name: OPAL ALVILLAR MRN: [id number] Date of Birth: January 23, 1922  [facility]) CM/SW Contact:  Pollie Friar, RN Phone Number: 05/13/2019, 2:03 PM   Clinical Narrative:    Pt discharging home with [facility] services through [place]. Butch Penny with [facility] accepted the referral. Son states pt has cane at home. Sons to provide transportation home.   Final next level of care: Home w Home Health Services Barriers to Discharge: No Barriers Identified   Patient Goals and CMS Choice   CMS Medicare.gov Compare Post Acute Care list provided to:: Patient Represenative (must comment) Choice offered to / list presented to : Adult Children(son)  Discharge Placement                       Discharge Plan and Services                          HH Arranged: PT [place] Agency: [place] ([facility]) Date [place]: 05/13/19   Representative spoke with at [place]: [place] ([place]) Interventions     Readmission Risk Interventions No flowsheet data found.

## 2019-05-13 NOTE — Progress Notes (Signed)
Pt arrived to 3W21. Alert to self, place, month, and year. Forgetful of situation. Pt ambulates in the room with minimal assist. Telemetry verified, skin tear noted on left elbow and generalized bruising on bilateral arms. Pt reports no pain. Pt states she wants to get home as soon as possible to check on her 2 kittens. Pt consumed 100% of breakfast independently.

## 2019-05-13 NOTE — Progress Notes (Signed)
STROKE TEAM PROGRESS NOTE   INTERVAL HISTORY Pt sitting in chair, orientated to place and self and age but not to time. She can not tell why she is in hospital. However, she stated that she felt fine and she likes to go home.   Vitals:   05/13/19 0545 05/13/19 0815 05/13/19 0914 05/13/19 1207  BP: (!) 168/76 (!) 173/73 (!) 155/76 136/69  Pulse: 78 76 74 96  Resp: 14 15 16 16   Temp:   98.5 F (36.9 C)   TempSrc:   Oral   SpO2: 96% 95% 97% 98%  Weight:      Height:        CBC:  Recent Labs  Lab 05/12/19 1054 05/12/19 1059  WBC 9.5  --   NEUTROABS 6.9  --   HGB 13.8 13.9  HCT 42.4 41.0  MCV 93.6  --   PLT 237  --     Basic Metabolic Panel:  Recent Labs  Lab 05/12/19 1054 05/12/19 1059  NA 139 141  K 3.3* 3.3*  CL 102 103  CO2 23  --   GLUCOSE 172* 165*  BUN 16 17  CREATININE 1.05* 1.00  CALCIUM 9.5  --    Lipid Panel:     Component Value Date/Time   CHOL 265 (H) 05/13/2019 0414   TRIG 128 05/13/2019 0414   HDL 65 05/13/2019 0414   CHOLHDL 4.1 05/13/2019 0414   VLDL 26 05/13/2019 0414   LDLCALC 174 (H) 05/13/2019 0414   HgbA1c:  Lab Results  Component Value Date   HGBA1C 5.8 (H) 05/13/2019   Urine Drug Screen:     Component Value Date/Time   LABOPIA NONE DETECTED 05/12/2019 1243   COCAINSCRNUR NONE DETECTED 05/12/2019 1243   LABBENZ NONE DETECTED 05/12/2019 1243   AMPHETMU NONE DETECTED 05/12/2019 1243   THCU NONE DETECTED 05/12/2019 1243   LABBARB NONE DETECTED 05/12/2019 1243    Alcohol Level     Component Value Date/Time   ETH <10 05/12/2019 1054    IMAGING Ct Angio Head W Or Wo Contrast  Result Date: 05/12/2019 CLINICAL DATA:  Dysarthria and ataxia. EXAM: CT ANGIOGRAPHY HEAD AND NECK TECHNIQUE: Multidetector CT imaging of the head and neck was performed using the standard protocol during bolus administration of intravenous contrast. Multiplanar CT image reconstructions and MIPs were obtained to evaluate the vascular anatomy. Carotid  stenosis measurements (when applicable) are obtained utilizing NASCET criteria, using the distal internal carotid diameter as the denominator. CONTRAST:  132mL OMNIPAQUE IOHEXOL 350 MG/ML SOLN COMPARISON:  CT head without contrast 05/12/2019. FINDINGS: CTA NECK FINDINGS Aortic arch: Atherosclerotic calcifications are present in the aortic arch. There is mild narrowing at the origin of the left subclavian artery. A 3 vessel arch configuration is present. There is no significant stenosis of the great vessel origins. Right carotid system: Right common carotid artery is within limits. Prominent calcified and noncalcified plaque is present at the right carotid bifurcation. The lumen is narrowed to 1.5 mm. This compares with a more distal measurement of 4.3 mm. There is some tortuosity of the cervical right ICA without significant stenosis. Left carotid system: The left common carotid artery is within normal limits. Atherosclerotic calcifications are present at the bifurcation without significant stenosis. The cervical left ICA is otherwise normal. Vertebral arteries: The vertebral arteries are codominant. There is some calcification at the origin of both vertebral arteries at the subclavian arteries without significant stenosis. There is no significant stenosis of either vertebral artery in the  neck. Skeleton: Degenerative changes are present in the cervical spine. Vertebral body heights are maintained. No focal lytic or blastic lesions are present. Other neck: Heterogeneous thyroid is present without a dominant nodule. No significant adenopathy. Salivary glands are within normal limits. No focal mucosal or submucosal lesions are present. Upper chest: Lung apices are clear without focal nodule, mass, or airspace disease. Thoracic inlet is normal. Review of the MIP images confirms the above findings CTA HEAD FINDINGS Anterior circulation: Atherosclerotic calcifications are present in the cavernous internal carotid  arteries bilaterally without significant stenosis through the ICA termini. The A1 segments are within normal limits. Anterior communicating artery is patent. There is moderate narrowing of the distal right M1 segment, just proximal to the bifurcation. The left M1 segment is normal. There is moderate stenosis in the pericallosal arteries bilaterally. The MCA bifurcations are intact. There is segmental narrowing within MCA branch vessels bilaterally without a significant proximal stenosis or occlusion. Branch vessels are more robust left than right. This suggests some degree decreased perfusion to the right MCA branches. Posterior circulation: The vertebral arteries are codominant. PICA origins are visualized and normal. The basilar artery is normal. Both posterior cerebral arteries originate basilar tip. There is a high-grade stenosis in the proximal right P2 segment. The left PCA is occluded proximally with extensive collaterals. This may be chronic. Venous sinuses: Dural sinuses are patent. The left transverse sinus is dominant. The straight sinus deep cerebral veins are intact. Cortical veins are unremarkable. Anatomic variants: None Review of the MIP images confirms the above findings IMPRESSION: 1. 65% stenosis of the right internal carotid artery with mixed calcified and noncalcified plaque. 2. Moderate distal right M1 segment stenosis. 3. Decreased conspicuity of right MCA branch vessels compared to the left, suggesting decreased perfusion. 4. Occlusion of the proximal left posterior cerebral artery with prominent collaterals. This may be a chronic occlusion. 5. High-grade stenosis of the proximal right P2 segment with significant collaterals. 6. Additional atherosclerotic changes at the aortic arch left carotid bifurcation, vertebral artery origins, and bilateral cavernous internal carotid arteries without other significant stenosis. Aortic Atherosclerosis (ICD10-I70.0). These results were called by telephone  at the time of interpretation on 05/12/2019 at 11:38 am to provider [facility] , who verbally acknowledged these results. Electronically Signed   By: San Morelle M.D.   On: 05/12/2019 11:49   Ct Angio Neck W Or Wo Contrast  Result Date: 05/12/2019 CLINICAL DATA:  Dysarthria and ataxia. EXAM: CT ANGIOGRAPHY HEAD AND NECK TECHNIQUE: Multidetector CT imaging of the head and neck was performed using the standard protocol during bolus administration of intravenous contrast. Multiplanar CT image reconstructions and MIPs were obtained to evaluate the vascular anatomy. Carotid stenosis measurements (when applicable) are obtained utilizing NASCET criteria, using the distal internal carotid diameter as the denominator. CONTRAST:  173mL OMNIPAQUE IOHEXOL 350 MG/ML SOLN COMPARISON:  CT head without contrast 05/12/2019. FINDINGS: CTA NECK FINDINGS Aortic arch: Atherosclerotic calcifications are present in the aortic arch. There is mild narrowing at the origin of the left subclavian artery. A 3 vessel arch configuration is present. There is no significant stenosis of the great vessel origins. Right carotid system: Right common carotid artery is within limits. Prominent calcified and noncalcified plaque is present at the right carotid bifurcation. The lumen is narrowed to 1.5 mm. This compares with a more distal measurement of 4.3 mm. There is some tortuosity of the cervical right ICA without significant stenosis. Left carotid system: The left common carotid artery is within  normal limits. Atherosclerotic calcifications are present at the bifurcation without significant stenosis. The cervical left ICA is otherwise normal. Vertebral arteries: The vertebral arteries are codominant. There is some calcification at the origin of both vertebral arteries at the subclavian arteries without significant stenosis. There is no significant stenosis of either vertebral artery in the neck. Skeleton: Degenerative changes are present  in the cervical spine. Vertebral body heights are maintained. No focal lytic or blastic lesions are present. Other neck: Heterogeneous thyroid is present without a dominant nodule. No significant adenopathy. Salivary glands are within normal limits. No focal mucosal or submucosal lesions are present. Upper chest: Lung apices are clear without focal nodule, mass, or airspace disease. Thoracic inlet is normal. Review of the MIP images confirms the above findings CTA HEAD FINDINGS Anterior circulation: Atherosclerotic calcifications are present in the cavernous internal carotid arteries bilaterally without significant stenosis through the ICA termini. The A1 segments are within normal limits. Anterior communicating artery is patent. There is moderate narrowing of the distal right M1 segment, just proximal to the bifurcation. The left M1 segment is normal. There is moderate stenosis in the pericallosal arteries bilaterally. The MCA bifurcations are intact. There is segmental narrowing within MCA branch vessels bilaterally without a significant proximal stenosis or occlusion. Branch vessels are more robust left than right. This suggests some degree decreased perfusion to the right MCA branches. Posterior circulation: The vertebral arteries are codominant. PICA origins are visualized and normal. The basilar artery is normal. Both posterior cerebral arteries originate basilar tip. There is a high-grade stenosis in the proximal right P2 segment. The left PCA is occluded proximally with extensive collaterals. This may be chronic. Venous sinuses: Dural sinuses are patent. The left transverse sinus is dominant. The straight sinus deep cerebral veins are intact. Cortical veins are unremarkable. Anatomic variants: None Review of the MIP images confirms the above findings IMPRESSION: 1. 65% stenosis of the right internal carotid artery with mixed calcified and noncalcified plaque. 2. Moderate distal right M1 segment stenosis. 3.  Decreased conspicuity of right MCA branch vessels compared to the left, suggesting decreased perfusion. 4. Occlusion of the proximal left posterior cerebral artery with prominent collaterals. This may be a chronic occlusion. 5. High-grade stenosis of the proximal right P2 segment with significant collaterals. 6. Additional atherosclerotic changes at the aortic arch left carotid bifurcation, vertebral artery origins, and bilateral cavernous internal carotid arteries without other significant stenosis. Aortic Atherosclerosis (ICD10-I70.0). These results were called by telephone at the time of interpretation on 05/12/2019 at 11:38 am to provider [facility] , who verbally acknowledged these results. Electronically Signed   By: San Morelle M.D.   On: 05/12/2019 11:49   Mr Brain Wo Contrast  Result Date: 05/12/2019 CLINICAL DATA:  Transient aphasia/slurred speech and right leg weakness. EXAM: MRI HEAD WITHOUT CONTRAST TECHNIQUE: Multiplanar, multiecho pulse sequences of the brain and surrounding structures were obtained without intravenous contrast. COMPARISON:  Head CT and CTA 05/12/2019 FINDINGS: The patient terminated the examination prior to completion. Only axial and coronal diffusion sequences were obtained. No acute or early subacute infarct is evident. Confluent T2 hyperintensities in the cerebral white matter bilaterally are nonspecific but compatible with extensive chronic small vessel ischemic disease. There is moderate cerebral atrophy. No intracranial mass effect or sizable extra-axial fluid collection is identified. IMPRESSION: 1. Diffusion only examination. 2. No acute infarct. 3. Extensive chronic small vessel ischemic disease. Electronically Signed   By: Logan Bores M.D.   On: 05/12/2019 14:21   Ct  Head Code Stroke Wo Contrast  Result Date: 05/12/2019 CLINICAL DATA:  Code stroke. Acute onset of right-sided weakness and slurred speech. Last seen well 30 minutes ago. Ataxia, stroke  suspected. EXAM: CT HEAD WITHOUT CONTRAST TECHNIQUE: Contiguous axial images were obtained from the base of the skull through the vertex without intravenous contrast. COMPARISON:  None. FINDINGS: Brain: Advanced atrophy and white matter disease is present. No acute cortical infarct is present. There is no acute hemorrhage or mass lesion. Basal ganglia are intact. Insular ribbon is normal. The ventricles are proportionate to the degree of atrophy. No significant extraaxial fluid collection is present. The brainstem and cerebellum are within normal limits. Vascular: Extensive atherosclerotic changes are present within the cavernous internal carotid arteries and at the dural margin of the left vertebral artery. There is no hyperdense vessel. Skull: Calvarium is intact. No focal lytic or blastic lesions are present. Sinuses/Orbits: The paranasal sinuses and mastoid air cells are clear. Bilateral lens replacements are noted. Globes and orbits are otherwise unremarkable. ASPECTS [facility] Stroke Program Early CT Score) - Ganglionic level infarction (caudate, lentiform nuclei, internal capsule, insula, M1-M3 cortex): 7/7 - Supraganglionic infarction (M4-M6 cortex): 3/3 Total score (0-10 with 10 being normal): 10/10 IMPRESSION: 1. No acute intracranial abnormality. 2. Advanced atrophy and diffuse white matter disease likely reflects the sequela of chronic microvascular ischemia. 3. Atherosclerosis 4. ASPECTS is 10/10 The above was relayed via text pager to Dr. Rory Percy on 05/12/2019 at 11:11 . Electronically Signed   By: San Morelle M.D.   On: 05/12/2019 11:11    PHYSICAL EXAM  Temp:  [98.5 F (36.9 C)] 98.5 F (36.9 C) (11/04 0914) Pulse Rate:  [63-96] 96 (11/04 1207) Resp:  [14-20] 16 (11/04 1207) BP: (107-196)/(45-100) 136/69 (11/04 1207) SpO2:  [93 %-100 %] 98 % (11/04 1207)  General - Well nourished, well developed, in no apparent distress.  Ophthalmologic - fundi not visualized due to  noncooperation.  Cardiovascular - Regular rhythm and rate.  Mental Status -  Level of arousal and orientation to self, age, place were intact, however, not orientated to time. Language including expression, repetition, comprehension was assessed and found intact. Naming 2/3. Delay recall 0/3 Fund of Knowledge was assessed and was impaired.  Cranial Nerves II - XII - II - Visual field intact OU. III, IV, VI - Extraocular movements intact. V - Facial sensation intact bilaterally. VII - Facial movement intact bilaterally. VIII - Hearing & vestibular intact bilaterally. X - Palate elevates symmetrically. XI - Chin turning & shoulder shrug intact bilaterally. XII - Tongue protrusion intact.  Motor Strength - The patient's strength was normal in all extremities and pronator drift was absent.  Bulk was normal and fasciculations were absent.   Motor Tone - Muscle tone was assessed at the neck and appendages and was normal.  Reflexes - The patient's reflexes were symmetrical in all extremities and she had no pathological reflexes.  Sensory - Light touch, temperature/pinprick were assessed and were symmetrical.    Coordination - The patient had normal movements in the hands with no ataxia or dysmetria. However, difficulty following instructions. Tremor was absent.  Gait and Station - deferred.  ASSESSMENT/PLAN Ms. TASI BEAN is a 85 y.o. female with history of atrial tachycardia but no atrial fibrillation, right internal carotid stenosis by carotid ultrasound 60 to 79%, hypertension, hypercholesterolemia presenting with slurred speech and difficulty walking.   Encephalopathy in the setting of dementia and hypertension vs. TIA - nonspecific symptoms not able to pinpoint to  right ICA system.  CT head No acute abnormality.   CTA head & neck R ICA 65% stenosis. Moderate distal R M1 stenosis. Decreased perfusion R MCA. L PCA occlusion w/ collaterals. Proximal R P2 high-grade  stenosis w/ collaterals. Aortic arch, L ICA bifurcation, VA origins, B cavernous ICA atherosclerosis.   MRI  No acute infarct. Extensive small vessel disease.   2D Echo EF 65-70%  LDL 174  HgbA1c 5.8  Lovenox 40 mg sq daily for VTE prophylaxis  aspirin 81 mg daily prior to admission, now on aspirin 325 mg daily and clopidogrel 75 mg daily. Recommend to continue DAPT for 3 months and then ASA alone.   Therapy recommendations:  pending   Disposition:  pending   Carotid Stenosis, known, asymptomatic   Pt has been following with [facility]  Serial CUS showed right ICA 40-59% stenosis with last one done 09/2018  This admission CTA neck R ICA 65% stenosis.   Presenting symptoms more nonspecific, not able to pinpoint to right carotid system  Still considered asymptomatic right ICA stenosis  Recommend to continue follow up with [facility] for further monitoring and management  Hypertension  Stable . Permissive hypertension (OK if < 180/105) but gradually normalize in 5-7 days . Long-term BP goal 130-150 given ICA stenosis . Avoid low BP  Hyperlipidemia  Home meds:  No statin  Now on lipitor 80  LDL 174, goal < 70  Continue statin at discharge  Other Stroke Risk Factors  Advanced age  Overweight, Body mass index is 27.43 kg/m., recommend weight loss, diet and exercise as appropriate   Other Active Problems  Baseline dementia, lives alone and does own ADLs, some sundowning and confused at hs. Sleep cycles not good per son. On Aricept  Atrial tachycardia on metoprolol 50 bid  Depression on lexapro  Hypothyroid, TSH 0.333 on synthroid PTA  Hospital day # 0  Neurology will sign off. Please call with questions. Pt will follow up with stroke clinic NP at [facility] in about 4 weeks. Thanks for the consult.  Rosalin Hawking, MD PhD Stroke Neurology 05/13/2019 2:04 PM   To contact Stroke Continuity provider, please refer to [email or web address] After hours, contact General Neurology

## 2019-05-13 NOTE — Progress Notes (Signed)
Pt discharge education reviewed with patient and daughter in law Malijah Rubert. IV and tele removed without complication. Pt discharged from facility via wheelchair.

## 2019-05-13 NOTE — Progress Notes (Signed)
OT Cancellation Note  Patient Details Name: Maria Clarke MRN: [id number] DOB: [birth year]   Cancelled Treatment:    Reason Eval/Treat Not Completed: Patient at procedure or test/ unavailable (echo), will follow up for OT eval as able.  Lou Cal, OT [facility] Pager [phone] Office [phone]   Raymondo Band 05/13/2019, 10:53 AM

## 2019-05-13 NOTE — Care Management Obs Status (Signed)
[facility] NOTIFICATION   Patient Details  Name: [name] MRN: [id number] Date of Birth: 11/21/1921   Medicare Observation Status Notification Given:  Yes    Pollie Friar, RN 05/13/2019, 2:01 PM

## 2019-05-13 NOTE — ED Notes (Signed)
ED TO INPATIENT HANDOFF REPORT  ED Nurse Name and Phone #: maggie [phone]  S Name/Age/Gender Maria Clarke 85 y.o. female Room/Bed: 033C/033C  Code Status   Code Status: Full Code  Home/SNF/Other Home Patient oriented to: self Is this baseline? Yes   Triage Complete: Triage complete  Chief Complaint Code stroke  Triage Note No notes on file   Allergies Allergies  Allergen Reactions  . Sulfonamide Derivatives Other (See Comments)    "ORAL S.E." (family unaware what time means)    Level of Care/Admitting Diagnosis ED Disposition    ED Disposition Condition [place]: [place] [[id number]]  Level of Care: Progressive [102]  Admit to Progressive based on following criteria: NEUROLOGICAL AND NEUROSURGICAL complex patients with significant risk of instability, who do not meet ICU criteria, yet require close observation or frequent assessment (< / = every 2 - 4 hours) with medical / nursing intervention.  Covid Evaluation: Asymptomatic Screening Protocol (No Symptoms)  Diagnosis: TIA (transient ischemic attackPP:[id number]  Admitting Physician: Bosie Helper  Attending Physician: Joni Reining C [[id number]]  PT Class (Do Not Modify): Observation [104]  PT Acc Code (Do Not Modify): Observation [10022]       B Medical/Surgery History Past Medical History:  Diagnosis Date  . Atrial tachycardia ([place])    a. Noted 02/2013 on EKG.  . Carotid artery disease ([place])    Carotid duplex showed [id number] RICA and [id number] LICA  . Depression   . Hypercholesteremia   . Hypertension   . Hypothyroidism    Past Surgical History:  Procedure Laterality Date  . BREAST SURGERY Left    Benign Tumor- several years ago  . CESAREAN SECTION    . EYE SURGERY Bilateral    cataract     A IV Location/Drains/Wounds Patient Lines/Drains/Airways Status   Active Line/Drains/Airways    Name:   Placement date:   Placement time:   Site:   Days:    Peripheral IV 05/12/19 Left Antecubital   05/12/19    1123    Antecubital   1          Intake/Output Last 24 hours No intake or output data in the 24 hours ending 05/13/19 [id number]  Labs/Imaging Results for orders placed or performed during the hospital encounter of 05/12/19 (from the past 48 hour(s))  Ethanol     Status: None   Collection Time: 05/12/19 10:54 AM  Result Value Ref Range   Alcohol, Ethyl (B) <10 <10 mg/dL    Comment: (NOTE) Lowest detectable limit for serum alcohol is 10 mg/dL. For medical purposes only. Performed at [facility], [place] [address], [place], [place] [address]   Protime-INR     Status: None   Collection Time: 05/12/19 10:54 AM  Result Value Ref Range   Prothrombin Time 13.6 11.4 - 15.2 seconds   INR 1.1 0.8 - 1.2    Comment: (NOTE) INR goal varies based on device and disease states. Performed at [facility], [place] [address] [place], [place] [address]   APTT     Status: None   Collection Time: 05/12/19 10:54 AM  Result Value Ref Range   aPTT 26 24 - 36 seconds    Comment: Performed at [facility] [address], [place],  [address]  CBC     Status: None   Collection Time: 05/12/19 10:54 AM  Result Value Ref Range   WBC 9.5 4.0 -  10.5 K/uL   RBC 4.53 3.87 - 5.11 MIL/uL   Hemoglobin 13.8 12.0 - 15.0 g/dL   HCT 42.4 36.0 - 46.0 %   MCV 93.6 80.0 - 100.0 fL   MCH 30.5 26.0 - 34.0 pg   MCHC 32.5 30.0 - 36.0 g/dL   RDW 13.6 11.5 - 15.5 %   Platelets 237 150 - 400 K/uL   nRBC 0.0 0.0 - 0.2 %    Comment: Performed at [facility], [place] [address], [place], [place] [address]  Differential     Status: None   Collection Time: 05/12/19 10:54 AM  Result Value Ref Range   Neutrophils Relative % 74 %   Neutro Abs 6.9 1.7 - 7.7 K/uL   Lymphocytes Relative 17 %   Lymphs Abs 1.6 0.7 - 4.0 K/uL   Monocytes Relative 9 %   Monocytes Absolute 0.8 0.1 - 1.0 K/uL   Eosinophils Relative 0 %   Eosinophils Absolute 0.0 0.0 -  0.5 K/uL   Basophils Relative 0 %   Basophils Absolute 0.0 0.0 - 0.1 K/uL   Immature Granulocytes 0 %   Abs Immature Granulocytes 0.04 0.00 - 0.07 K/uL    Comment: Performed at [facility], [place] [address] [place], [place] [address]  Comprehensive metabolic panel     Status: Abnormal   Collection Time: 05/12/19 10:54 AM  Result Value Ref Range   Sodium 139 135 - 145 mmol/L   Potassium 3.3 (L) 3.5 - 5.1 mmol/L   Chloride 102 98 - 111 mmol/L   CO2 23 22 - 32 mmol/L   Glucose, Bld 172 (H) 70 - 99 mg/dL   BUN 16 8 - 23 mg/dL   Creatinine, Ser 1.05 (H) 0.44 - 1.00 mg/dL   Calcium 9.5 8.9 - 10.3 mg/dL   Total Protein 6.2 (L) 6.5 - 8.1 g/dL   Albumin 3.3 (L) 3.5 - 5.0 g/dL   AST 21 15 - 41 U/L   ALT 12 0 - 44 U/L   Alkaline Phosphatase 58 38 - 126 U/L   Total Bilirubin 0.4 0.3 - 1.2 mg/dL   GFR calc non Af Amer 45 (L) >60 mL/min   GFR calc Af Amer 52 (L) >60 mL/min   Anion gap 14 5 - 15    Comment: Performed at [facility] [address] [place], [place] [address]  CBG monitoring, ED     Status: Abnormal   Collection Time: 05/12/19 10:57 AM  Result Value Ref Range   Glucose-Capillary 169 (H) 70 - 99 mg/dL   Comment 1 Notify RN    Comment 2 Document in Chart   I-stat chem 8, ED     Status: Abnormal   Collection Time: 05/12/19 10:59 AM  Result Value Ref Range   Sodium 141 135 - 145 mmol/L   Potassium 3.3 (L) 3.5 - 5.1 mmol/L   Chloride 103 98 - 111 mmol/L   BUN 17 8 - 23 mg/dL   Creatinine, Ser 1.00 0.44 - 1.00 mg/dL   Glucose, Bld 165 (H) 70 - 99 mg/dL   Calcium, Ion 1.19 1.15 - 1.40 mmol/L   TCO2 24 22 - 32 mmol/L   Hemoglobin 13.9 12.0 - 15.0 g/dL   HCT 41.0 36.0 - 46.0 %  Urine rapid drug screen (hosp performed)     Status: None   Collection Time: 05/12/19 12:43 PM  Result Value Ref Range   Opiates NONE DETECTED NONE DETECTED   Cocaine  NONE DETECTED NONE DETECTED   Benzodiazepines NONE DETECTED NONE DETECTED   Amphetamines NONE DETECTED NONE DETECTED    Tetrahydrocannabinol NONE DETECTED NONE DETECTED   Barbiturates NONE DETECTED NONE DETECTED    Comment: (NOTE) DRUG SCREEN FOR MEDICAL PURPOSES ONLY.  IF CONFIRMATION IS NEEDED FOR ANY PURPOSE, NOTIFY LAB WITHIN 5 DAYS. LOWEST DETECTABLE LIMITS FOR URINE DRUG SCREEN Drug Class                     Cutoff (ng/mL) Amphetamine and metabolites    1000 Barbiturate and metabolites    200 Benzodiazepine                 [id number] Tricyclics and metabolites     300 Opiates and metabolites        300 Cocaine and metabolites        300 THC                            50 Performed at [facility], [place] [address], [place], [place] [address]   Urinalysis, Routine w reflex microscopic     Status: Abnormal   Collection Time: 05/12/19 12:43 PM  Result Value Ref Range   Color, Urine YELLOW YELLOW   APPearance CLEAR CLEAR   Specific Gravity, Urine 1.033 (H) 1.005 - 1.030   pH 6.0 5.0 - 8.0   Glucose, UA NEGATIVE NEGATIVE mg/dL   Hgb urine dipstick SMALL (A) NEGATIVE   Bilirubin Urine NEGATIVE NEGATIVE   Ketones, ur NEGATIVE NEGATIVE mg/dL   Protein, ur NEGATIVE NEGATIVE mg/dL   Nitrite NEGATIVE NEGATIVE   Leukocytes,Ua NEGATIVE NEGATIVE   RBC / HPF 0-5 0 - 5 RBC/hpf   WBC, UA 0-5 0 - 5 WBC/hpf   Bacteria, UA NONE SEEN NONE SEEN   Squamous Epithelial / LPF 0-5 0 - 5   Mucus PRESENT     Comment: Performed at [facility], [place] [address], [place], [place] [address]  SARS CORONAVIRUS 2 (TAT 6-24 HRS) Nasopharyngeal Nasopharyngeal Swab     Status: None   Collection Time: 05/12/19  4:44 PM   Specimen: Nasopharyngeal Swab  Result Value Ref Range   SARS Coronavirus 2 NEGATIVE NEGATIVE    Comment: (NOTE) SARS-CoV-2 target nucleic acids are NOT DETECTED. The SARS-CoV-2 RNA is generally detectable in upper and lower respiratory specimens during the acute phase of infection. Negative results do not preclude SARS-CoV-2 infection, do not rule out co-infections with other pathogens, and should  not be used as the sole basis for treatment or other patient management decisions. Negative results must be combined with clinical observations, patient history, and epidemiological information. The expected result is Negative. Fact Sheet for Patients: SugarRoll.be Fact Sheet for Healthcare Providers: https://www.woods-mathews.com/ This test is not yet approved or cleared by the [place] FDA and  has been authorized for detection and/or diagnosis of SARS-CoV-2 by FDA under an Emergency Use Authorization (EUA). This EUA will remain  in effect (meaning this test can be used) for the duration of the COVID-19 declaration under Section 56 4(b)(1) of the Act, 21 U.S.C. section 360bbb-3(b)(1), unless the authorization is terminated or revoked sooner. Performed at [facility], [place] [address] [place], [place] [address]   TSH     Status: Abnormal   Collection Time: 05/12/19  8:35 PM  Result Value Ref Range   TSH 0.333 (L) 0.350 - 4.500 uIU/mL    Comment:  Performed by a 3rd Generation assay with a functional sensitivity of <=0.01 uIU/mL. Performed at [facility], [place] [address], [place], [place] [address]   CBG monitoring, ED     Status: Abnormal   Collection Time: 05/13/19  1:30 AM  Result Value Ref Range   Glucose-Capillary 116 (H) 70 - 99 mg/dL  Hemoglobin A1c     Status: Abnormal   Collection Time: 05/13/19  4:14 AM  Result Value Ref Range   Hgb A1c MFr Bld 5.8 (H) 4.8 - 5.6 %    Comment: (NOTE) Pre diabetes:          5.7%-6.4% Diabetes:              >6.4% Glycemic control for   <7.0% adults with diabetes    Mean Plasma Glucose 119.76 mg/dL    Comment: Performed at [facility] [address] [place], [place] [address]   Ct Angio Head W Or Wo Contrast  Result Date: 05/12/2019 CLINICAL DATA:  Dysarthria and ataxia. EXAM: CT ANGIOGRAPHY HEAD AND NECK TECHNIQUE: Multidetector CT imaging of the head and neck was  performed using the standard protocol during bolus administration of intravenous contrast. Multiplanar CT image reconstructions and MIPs were obtained to evaluate the vascular anatomy. Carotid stenosis measurements (when applicable) are obtained utilizing NASCET criteria, using the distal internal carotid diameter as the denominator. CONTRAST:  146mL OMNIPAQUE IOHEXOL 350 MG/ML SOLN COMPARISON:  CT head without contrast 05/12/2019. FINDINGS: CTA NECK FINDINGS Aortic arch: Atherosclerotic calcifications are present in the aortic arch. There is mild narrowing at the origin of the left subclavian artery. A 3 vessel arch configuration is present. There is no significant stenosis of the great vessel origins. Right carotid system: Right common carotid artery is within limits. Prominent calcified and noncalcified plaque is present at the right carotid bifurcation. The lumen is narrowed to 1.5 mm. This compares with a more distal measurement of 4.3 mm. There is some tortuosity of the cervical right ICA without significant stenosis. Left carotid system: The left common carotid artery is within normal limits. Atherosclerotic calcifications are present at the bifurcation without significant stenosis. The cervical left ICA is otherwise normal. Vertebral arteries: The vertebral arteries are codominant. There is some calcification at the origin of both vertebral arteries at the subclavian arteries without significant stenosis. There is no significant stenosis of either vertebral artery in the neck. Skeleton: Degenerative changes are present in the cervical spine. Vertebral body heights are maintained. No focal lytic or blastic lesions are present. Other neck: Heterogeneous thyroid is present without a dominant nodule. No significant adenopathy. Salivary glands are within normal limits. No focal mucosal or submucosal lesions are present. Upper chest: Lung apices are clear without focal nodule, mass, or airspace disease. Thoracic  inlet is normal. Review of the MIP images confirms the above findings CTA HEAD FINDINGS Anterior circulation: Atherosclerotic calcifications are present in the cavernous internal carotid arteries bilaterally without significant stenosis through the ICA termini. The A1 segments are within normal limits. Anterior communicating artery is patent. There is moderate narrowing of the distal right M1 segment, just proximal to the bifurcation. The left M1 segment is normal. There is moderate stenosis in the pericallosal arteries bilaterally. The MCA bifurcations are intact. There is segmental narrowing within MCA branch vessels bilaterally without a significant proximal stenosis or occlusion. Branch vessels are more robust left than right. This suggests some degree decreased perfusion to the right MCA branches. Posterior circulation: The vertebral arteries are  codominant. PICA origins are visualized and normal. The basilar artery is normal. Both posterior cerebral arteries originate basilar tip. There is a high-grade stenosis in the proximal right P2 segment. The left PCA is occluded proximally with extensive collaterals. This may be chronic. Venous sinuses: Dural sinuses are patent. The left transverse sinus is dominant. The straight sinus deep cerebral veins are intact. Cortical veins are unremarkable. Anatomic variants: None Review of the MIP images confirms the above findings IMPRESSION: 1. 65% stenosis of the right internal carotid artery with mixed calcified and noncalcified plaque. 2. Moderate distal right M1 segment stenosis. 3. Decreased conspicuity of right MCA branch vessels compared to the left, suggesting decreased perfusion. 4. Occlusion of the proximal left posterior cerebral artery with prominent collaterals. This may be a chronic occlusion. 5. High-grade stenosis of the proximal right P2 segment with significant collaterals. 6. Additional atherosclerotic changes at the aortic arch left carotid bifurcation,  vertebral artery origins, and bilateral cavernous internal carotid arteries without other significant stenosis. Aortic Atherosclerosis (ICD10-I70.0). These results were called by telephone at the time of interpretation on 05/12/2019 at 11:38 am to provider [facility] , who verbally acknowledged these results. Electronically Signed   By: San Morelle M.D.   On: 05/12/2019 11:49   Ct Angio Neck W Or Wo Contrast  Result Date: 05/12/2019 CLINICAL DATA:  Dysarthria and ataxia. EXAM: CT ANGIOGRAPHY HEAD AND NECK TECHNIQUE: Multidetector CT imaging of the head and neck was performed using the standard protocol during bolus administration of intravenous contrast. Multiplanar CT image reconstructions and MIPs were obtained to evaluate the vascular anatomy. Carotid stenosis measurements (when applicable) are obtained utilizing NASCET criteria, using the distal internal carotid diameter as the denominator. CONTRAST:  114mL OMNIPAQUE IOHEXOL 350 MG/ML SOLN COMPARISON:  CT head without contrast 05/12/2019. FINDINGS: CTA NECK FINDINGS Aortic arch: Atherosclerotic calcifications are present in the aortic arch. There is mild narrowing at the origin of the left subclavian artery. A 3 vessel arch configuration is present. There is no significant stenosis of the great vessel origins. Right carotid system: Right common carotid artery is within limits. Prominent calcified and noncalcified plaque is present at the right carotid bifurcation. The lumen is narrowed to 1.5 mm. This compares with a more distal measurement of 4.3 mm. There is some tortuosity of the cervical right ICA without significant stenosis. Left carotid system: The left common carotid artery is within normal limits. Atherosclerotic calcifications are present at the bifurcation without significant stenosis. The cervical left ICA is otherwise normal. Vertebral arteries: The vertebral arteries are codominant. There is some calcification at the origin of both  vertebral arteries at the subclavian arteries without significant stenosis. There is no significant stenosis of either vertebral artery in the neck. Skeleton: Degenerative changes are present in the cervical spine. Vertebral body heights are maintained. No focal lytic or blastic lesions are present. Other neck: Heterogeneous thyroid is present without a dominant nodule. No significant adenopathy. Salivary glands are within normal limits. No focal mucosal or submucosal lesions are present. Upper chest: Lung apices are clear without focal nodule, mass, or airspace disease. Thoracic inlet is normal. Review of the MIP images confirms the above findings CTA HEAD FINDINGS Anterior circulation: Atherosclerotic calcifications are present in the cavernous internal carotid arteries bilaterally without significant stenosis through the ICA termini. The A1 segments are within normal limits. Anterior communicating artery is patent. There is moderate narrowing of the distal right M1 segment, just proximal to the bifurcation. The left M1 segment is normal.  There is moderate stenosis in the pericallosal arteries bilaterally. The MCA bifurcations are intact. There is segmental narrowing within MCA branch vessels bilaterally without a significant proximal stenosis or occlusion. Branch vessels are more robust left than right. This suggests some degree decreased perfusion to the right MCA branches. Posterior circulation: The vertebral arteries are codominant. PICA origins are visualized and normal. The basilar artery is normal. Both posterior cerebral arteries originate basilar tip. There is a high-grade stenosis in the proximal right P2 segment. The left PCA is occluded proximally with extensive collaterals. This may be chronic. Venous sinuses: Dural sinuses are patent. The left transverse sinus is dominant. The straight sinus deep cerebral veins are intact. Cortical veins are unremarkable. Anatomic variants: None Review of the MIP  images confirms the above findings IMPRESSION: 1. 65% stenosis of the right internal carotid artery with mixed calcified and noncalcified plaque. 2. Moderate distal right M1 segment stenosis. 3. Decreased conspicuity of right MCA branch vessels compared to the left, suggesting decreased perfusion. 4. Occlusion of the proximal left posterior cerebral artery with prominent collaterals. This may be a chronic occlusion. 5. High-grade stenosis of the proximal right P2 segment with significant collaterals. 6. Additional atherosclerotic changes at the aortic arch left carotid bifurcation, vertebral artery origins, and bilateral cavernous internal carotid arteries without other significant stenosis. Aortic Atherosclerosis (ICD10-I70.0). These results were called by telephone at the time of interpretation on 05/12/2019 at 11:38 am to provider [facility] , who verbally acknowledged these results. Electronically Signed   By: San Morelle M.D.   On: 05/12/2019 11:49   Mr Brain Wo Contrast  Result Date: 05/12/2019 CLINICAL DATA:  Transient aphasia/slurred speech and right leg weakness. EXAM: MRI HEAD WITHOUT CONTRAST TECHNIQUE: Multiplanar, multiecho pulse sequences of the brain and surrounding structures were obtained without intravenous contrast. COMPARISON:  Head CT and CTA 05/12/2019 FINDINGS: The patient terminated the examination prior to completion. Only axial and coronal diffusion sequences were obtained. No acute or early subacute infarct is evident. Confluent T2 hyperintensities in the cerebral white matter bilaterally are nonspecific but compatible with extensive chronic small vessel ischemic disease. There is moderate cerebral atrophy. No intracranial mass effect or sizable extra-axial fluid collection is identified. IMPRESSION: 1. Diffusion only examination. 2. No acute infarct. 3. Extensive chronic small vessel ischemic disease. Electronically Signed   By: Logan Bores M.D.   On: 05/12/2019 14:21    Ct Head Code Stroke Wo Contrast  Result Date: 05/12/2019 CLINICAL DATA:  Code stroke. Acute onset of right-sided weakness and slurred speech. Last seen well 30 minutes ago. Ataxia, stroke suspected. EXAM: CT HEAD WITHOUT CONTRAST TECHNIQUE: Contiguous axial images were obtained from the base of the skull through the vertex without intravenous contrast. COMPARISON:  None. FINDINGS: Brain: Advanced atrophy and white matter disease is present. No acute cortical infarct is present. There is no acute hemorrhage or mass lesion. Basal ganglia are intact. Insular ribbon is normal. The ventricles are proportionate to the degree of atrophy. No significant extraaxial fluid collection is present. The brainstem and cerebellum are within normal limits. Vascular: Extensive atherosclerotic changes are present within the cavernous internal carotid arteries and at the dural margin of the left vertebral artery. There is no hyperdense vessel. Skull: Calvarium is intact. No focal lytic or blastic lesions are present. Sinuses/Orbits: The paranasal sinuses and mastoid air cells are clear. Bilateral lens replacements are noted. Globes and orbits are otherwise unremarkable. ASPECTS [facility] Stroke Program Early CT Score) - Ganglionic level infarction (caudate, lentiform nuclei,  internal capsule, insula, M1-M3 cortex): 7/7 - Supraganglionic infarction (M4-M6 cortex): 3/3 Total score (0-10 with 10 being normal): 10/10 IMPRESSION: 1. No acute intracranial abnormality. 2. Advanced atrophy and diffuse white matter disease likely reflects the sequela of chronic microvascular ischemia. 3. Atherosclerosis 4. ASPECTS is 10/10 The above was relayed via text pager to Dr. Rory Percy on 05/12/2019 at 11:11 . Electronically Signed   By: San Morelle M.D.   On: 05/12/2019 11:11    Pending Labs Unresulted Labs (From admission, onward)    Start     Ordered   05/13/19 0500  Lipid panel  Tomorrow morning,   R    Comments: Fasting    05/12/19  1648          Vitals/Pain Today's Vitals   05/13/19 0407 05/13/19 0415 05/13/19 0500 05/13/19 0545  BP: (!) 187/71 (!) 159/70 (!) 157/63 (!) 168/76  Pulse: 79 78 65 78  Resp: 16 18 16 14   Temp:      TempSrc:      SpO2: 96% 97% 96% 96%  Weight:      Height:      PainSc: 0-No pain   Asleep    Isolation Precautions No active isolations  Medications Medications  donepezil (ARICEPT) tablet 5 mg (5 mg Oral Given 05/12/19 2059)  escitalopram (LEXAPRO) tablet 5 mg (5 mg Oral Given 05/12/19 2059)  levothyroxine (SYNTHROID) tablet 100 mcg (100 mcg Oral Given 05/13/19 0609)  acetaminophen (TYLENOL) tablet 650 mg (has no administration in time range)    Or  acetaminophen (TYLENOL) 160 MG/5ML solution 650 mg (has no administration in time range)    Or  acetaminophen (TYLENOL) suppository 650 mg (has no administration in time range)  enoxaparin (LOVENOX) injection 40 mg (40 mg Subcutaneous Given 05/12/19 1912)  aspirin tablet 325 mg (325 mg Oral Given 05/12/19 2058)  clopidogrel (PLAVIX) tablet 75 mg (75 mg Oral Given 05/12/19 2058)  atorvastatin (LIPITOR) tablet 80 mg (80 mg Oral Given 05/12/19 2058)  iohexol (OMNIPAQUE) 350 MG/ML injection 100 mL (100 mLs Intravenous Contrast Given 05/12/19 1122)   stroke: mapping our early stages of recovery book ( Does not apply Given 05/12/19 1913)    Mobility walks Moderate fall risk   Focused Assessments Neuro Assessment Handoff:  Swallow screen pass? Yes    NIH Stroke Scale ( + Modified Stroke Scale Criteria)  Interval: Other (Comment) Level of Consciousness (1a.)   : Alert, keenly responsive LOC Questions (1b. )   +: Answers one question correctly LOC Commands (1c. )   + : Performs both tasks correctly Best Gaze (2. )  +: Normal Visual (3. )  +: No visual loss Facial Palsy (4. )    : Normal symmetrical movements Motor Arm, Left (5a. )   +: No drift Motor Arm, Right (5b. )   +: No drift Motor Leg, Left (6a. )   +: No drift Motor Leg,  Right (6b. )   +: No drift Limb Ataxia (7. ): Absent Sensory (8. )   +: Normal, no sensory loss Best Language (9. )   +: No aphasia Dysarthria (10. ): Normal Extinction/Inattention (11.)   +: No Abnormality Modified SS Total  +: 1 Complete NIHSS TOTAL: 1 Last date known well: 05/12/19 Last time known well: 1030 Neuro Assessment: Exceptions to WDL Neuro Checks:   Initial (05/12/19 1053)  Last Documented NIHSS Modified Score: 1 (05/13/19 0308) Has TPA been given? No If patient is a Neuro Trauma and patient is going  to OR before floor call report to [place] nurse: [phone] or [phone]     R Recommendations: See Admitting Provider Note  Report given to:   Additional Notes:  dementia

## 2019-05-13 NOTE — ED Notes (Signed)
Patient was out of bed again, looking through her bag for her "credit cards to check out". Pt redirected back to bed.

## 2019-05-13 NOTE — Progress Notes (Signed)
Pt chair alarm was going off when patient was found in the doorway of her room walking out. Patient was asked if she needed to use the restroom and she stated no. Pt was assisted back to the chair. She stated that she was just getting ready to go home. This RN assured patient that she was in the hospital to have some tests done today, and that she is not ready to be discharged at this time. Pt was agreeable. Pt left in the bed with the bed alarm on.

## 2019-05-13 NOTE — ED Notes (Signed)
Pt tried to get out of bed, stating that she "saw [name]". Pt informed that she is in the hospital, she is going upstairs to a room, and that she needed to get back into bed. Pt complied.

## 2019-05-13 NOTE — Progress Notes (Signed)
  Echocardiogram 2D Echocardiogram has been performed.  Jennette Dubin 05/13/2019, 11:36 AM

## 2019-05-13 NOTE — ED Notes (Signed)
Walked into patient's room to find her standing beside of stretcher, full weight-bearing. Pt was confused, and was unable to walk. But when pt was asked to lift her legs, separately (right then left), pt was able to comply. Myself and NT Randall Hiss assisted pt back to bed. Pt appears to have forgotten how to walk.

## 2019-05-13 NOTE — Evaluation (Signed)
Physical Therapy Evaluation Patient Details Name: [facility] MRN: [id number] DOB: 1922-02-24 Today's Date: 05/13/2019   History of Present Illness  85 yo female with PMH of right carotid stenosis (60-79%), hypertension, hyperlipidemia, hypothyroidism, and dementia who presented to the ED for acute onset slurred speech and gait difficulty witnessed by her son.  PMH positive for Atrial tachycardia ([place]), Carotid artery disease ([place]), Depression, Hypercholesteremia, Hypertension, and Hypothyroidism.  MRI negative for acute change, positive for ischemic small vessel disease.  Clinical Impression  Patient presents with decreased independence and safety with mobility due to decreased balance and gait.  Patient needing minguard to min A for ambulation in hallway.  No overt LOB, but drifting to one side at times and slow cautious gait.  Feel she will need initial 24 hour assist and follow up HHPT.  Patient with history of dementia so assume would do better in her familiar environment.  Will trial a cane or walker for balance, however likely not to use it at home due to dementia.  PT will follow acutely.     Follow Up Recommendations Home health PT;Supervision/Assistance - 24 hour    Equipment Recommendations  Other (comment)(TBA (possibly a cane))    Recommendations for Other Services       Precautions / Restrictions Precautions Precautions: Fall      Mobility  Bed Mobility               General bed mobility comments: up in recliner (no bed in room yet)  Transfers Overall transfer level: Needs assistance Equipment used: None Transfers: Sit to/from Stand Sit to Stand: Min guard         General transfer comment: assist for safety  Ambulation/Gait Ambulation/Gait assistance: Min guard Gait Distance (Feet): 150 Feet   Gait Pattern/deviations: Step-to pattern;Step-through pattern;Decreased stride length;Drifts right/left     General Gait Details: slow and cautious, some  drift to R in hallway, but no overt LOB  Stairs            Wheelchair Mobility    Modified Rankin (Stroke Patients Only) Modified Rankin (Stroke Patients Only) Pre-Morbid Rankin Score: Moderate disability(assumed) Modified Rankin: Moderately severe disability     Balance Overall balance assessment: Needs assistance   Sitting balance-[name] Scale: Good     Standing balance support: No upper extremity supported Standing balance-[name] Scale: Fair Standing balance comment: some instability with ambulation/dynamic activity                             Pertinent Vitals/Pain Pain Assessment: No/denies pain    Home Living Family/patient expects to be discharged to:: Private residence Living Arrangements: Children Available Help at Discharge: Family;Available PRN/intermittently Type of Home: House Home Access: Level entry     Home Layout: One level Home Equipment: Other (comment) Additional Comments: not sure of home equipment    Prior Function Level of Independence: Needs assistance      ADL's / Homemaking Assistance Needed: lives with sons, Maria Clarke has help for IADL's  Comments: sons work, sometimes she goes to gas station with them, but at times home alone     Hand Dominance        Extremity/Trunk Assessment   Upper Extremity Assessment Upper Extremity Assessment: Defer to OT evaluation    Lower Extremity Assessment Lower Extremity Assessment: Overall WFL for tasks assessed    Cervical / Trunk Assessment Cervical / Trunk Assessment: Kyphotic  Communication   Communication: No difficulties  Cognition Arousal/Alertness: Awake/alert Behavior During Therapy: WFL for tasks assessed/performed Overall Cognitive Status: No family/caregiver present to determine baseline cognitive functioning                                 General Comments: history of dementia, knows she is at [facility], but not why she is here till educated,  pleasant, but confused      General Comments General comments (skin integrity, edema, etc.): breakfast tray arrived during session, set up tray and pt able to self feed appropriately without signs of dysphagai, RN in room    Exercises     Assessment/Plan    PT Assessment Patient needs continued PT services  PT Problem List Decreased balance;Decreased mobility;Decreased safety awareness;Decreased knowledge of precautions;Decreased knowledge of use of DME       PT Treatment Interventions DME instruction;Therapeutic activities;Balance training;Patient/family education;Therapeutic exercise;Functional mobility training;Gait training;Neuromuscular re-education    PT Goals (Current goals can be found in the Care Plan section)  Acute Rehab PT Goals Patient Stated Goal: Return home to her cats PT Goal Formulation: With patient Time For Goal Achievement: 05/27/19 Potential to Achieve Goals: Good    Frequency Min 4X/week   Barriers to discharge        Co-evaluation               AM-PAC PT "6 Clicks" Mobility  Outcome Measure Help needed turning from your back to your side while in a flat bed without using bedrails?: None Help needed moving from lying on your back to sitting on the side of a flat bed without using bedrails?: None Help needed moving to and from a bed to a chair (including a wheelchair)?: A Little Help needed standing up from a chair using your arms (e.g., wheelchair or bedside chair)?: None Help needed to walk in hospital room?: A Little Help needed climbing 3-5 steps with a railing? : A Little 6 Click Score: 21    End of Session Equipment Utilized During Treatment: Gait belt Activity Tolerance: Patient tolerated treatment well Patient left: in chair;with call bell/phone within reach;with chair alarm set   PT Visit Diagnosis: Other abnormalities of gait and mobility (R26.89)    Time: [id number] PT Time Calculation (min) (ACUTE ONLY): 13 min   Charges:    PT Evaluation $PT Eval Moderate Complexity: [place] [phone] 05/13/2019   Reginia Naas 05/13/2019, 10:08 AM

## 2019-05-13 NOTE — Discharge Summary (Addendum)
Name: Maria Clarke MRN: [id number] DOB: [birth year] 85 y.o. PCP: [name], NP  Date of Admission: 05/12/2019 10:52 AM Date of Discharge: 05/13/2019 Attending Physician: Lucious Groves, DO  Discharge Diagnosis: 1.  TIA 2. Right carotid stenosis 3. Gait instability  Discharge Medications: Allergies as of 05/13/2019      Reactions   Sulfonamide Derivatives Other (See Comments)   "ORAL S.E." (family unaware what time means)      Medication List    TAKE these medications   amLODipine 5 MG tablet Commonly known as: NORVASC Take 1 tablet (5 mg total) by mouth daily.   aspirin 325 MG tablet Take 1 tablet (325 mg total) by mouth daily. Start taking on: May 14, 2019 What changed:   medication strength  how much to take  when to take this   atorvastatin 80 MG tablet Commonly known as: LIPITOR Take 1 tablet (80 mg total) by mouth daily at 6 PM.   clopidogrel 75 MG tablet Commonly known as: PLAVIX Take 1 tablet (75 mg total) by mouth daily. Start taking on: May 14, 2019   cyanocobalamin 1000 MCG/ML injection Commonly known as: (VITAMIN B-12) Inject 1 mL (1,000 mcg total) into the skin every 30 (thirty) days.   diphenhydramine-acetaminophen 25-500 MG Tabs tablet Commonly known as: TYLENOL PM Take 1 tablet by mouth at bedtime.   donepezil 5 MG tablet Commonly known as: ARICEPT Take 1 tablet (5 mg total) by mouth at bedtime.   escitalopram 5 MG tablet Commonly known as: LEXAPRO Take 1 tablet (5 mg total) by mouth at bedtime.   levothyroxine 100 MCG tablet Commonly known as: SYNTHROID Take 1 tablet (100 mcg total) by mouth daily before breakfast.   methylPREDNISolone 4 MG Tbpk tablet Commonly known as: MEDROL DOSEPAK Take as directed What changed:   how much to take  how to take this  when to take this  additional instructions   metoprolol tartrate 50 MG tablet Commonly known as: LOPRESSOR TAKE 1 TABLET BY MOUTH TWICE A DAY What  changed:   how much to take  how to take this  when to take this       Disposition and follow-up:   Maria Clarke was discharged from [facility] in Stable condition.  At the hospital follow up visit please address:  TIA.  Started on aspirin and Plavix and Lipitor.  Aspirin and Plavix recommended to be continued for 3 months per neurology. Follow up with neurology.  60 to 65% right carotid stenosis.  Neurology consulted during hospitalization with recommendations to follow-up with vascular surgery outpatient.  Fall risk. Gait instability. PT evaluated during hospitalization and recommends for home health to evaluate for fall risk and gait instability  Hypothyroidism. TSH evaluated due to previously elevated level and neurological sx. TSH 0.33 at time of discharge. Will require close follow up for management of synthroid.  Follow-up Appointments: Follow-up Information    [facility]. Schedule an appointment as soon as possible for a visit in 4 week(s).   Specialty: Vascular Surgery Contact information: [address] [place] [phone]       [facility]. Schedule an appointment as soon as possible for a visit in 4 week(s).   Specialty: Neurology Contact information: [address] [place] Hospital Course by problem list: 85 yo female with hypertension, HLD, atrial tachycardia and carotid  stenosis who presented to ED on 11/3 for a transient episode of slurred speech and gait instability which resolved prior to arrival to ED. code stroke was called and CT head, CTA head and neck, and MRI perfusion scan were obtained and negative for acute findings.  NIH score was 2 on arrival.  Neurology was consulted and patient was placed on Plavix, aspirin and high-dose statin.  Echo was obtained and negative for an acute etiology of  the stroke.  PTwere also consulted who recommended home health for issues with gait instability and neurocognitive decline.  Home health orders were placed at time of discharge.  Patient also noted to have a history of right sided carotid stenosis (60 to 65%).  She follows with vascular surgery for this and neurology recommended she continue to do this.  Discharge Vitals:   BP 136/69 (BP Location: Right Arm)   Pulse 96   Temp 98 F (36.7 C) (Oral)   Resp 16   Ht 5' (1.524 m)   Wt 63.7 kg   SpO2 98%   BMI 27.43 kg/m   Pertinent Labs, Studies, and Procedures:  11/3 CTA head/neck:65% stenosis of right internal carotid artery. Decreased conspicuity of right MCA branch vessels suggesting decreased perfusion however no specific cutoff noted. Occlusion of proximal left posterior cerebral artery with prominent collateral.   11/3 MRI brain diffusion only: no acute infarct. Extensive chronic small vessel ischemic disease. Moderate cerebral atrophy.  11/4 Echo complete: EF 65-70%. Normal LV function. Grade 1 diastolic dysfunction. LA and RA normal size. Severe aortic valve annular calcification. No MV stenosis or regurgitation. Tricuspid valve normal. Aortic valve moderately thickened with severe calcification. Mild to moderate AV regurge and stenosis. RA pressure estimated at 83mmHg.  Discharge Instructions: Discharge Instructions    Ambulatory referral to Neurology   Complete by: As directed    Follow up with stroke clinic NP (Jessica Vanschaick or Cecille Rubin, if both not available, consider Zachery Dauer, or Ahern) at [facility] in about 4 weeks. Thanks.   Ambulatory referral to Vascular Surgery   Complete by: As directed    Follow up in 4 weeks. Pt is Dr. Trula Slade pt. Thanks.   Diet - low sodium heart healthy   Complete by: As directed    Discharge instructions   Complete by: As directed    Follow up with PCP in 3-5 days. Follow up with vascular surgery.      Signed: Mitzi Hansen,  MD 05/13/2019, 2:33 PM   Pager: [phone]

## 2019-05-13 NOTE — Evaluation (Signed)
Occupational Therapy Evaluation Patient Details Name: Maria Clarke MRN: [id number] DOB: 24-Jun-1922 Today's Date: 05/13/2019    History of Present Illness 85 yo female with PMH of right carotid stenosis (60-79%), hypertension, hyperlipidemia, hypothyroidism, and dementia who presented to the ED for acute onset slurred speech and gait difficulty witnessed by her son.  PMH positive for Atrial tachycardia ([facility]), Carotid artery disease (Maria Clarke), Depression, Hypercholesteremia, Hypertension, and Hypothyroidism.  MRI negative for acute change, positive for ischemic small vessel disease.   Clinical Impression   This 85 y/o female presents with the above. PTA pt living with son, reports completing ADL without assist. Pt pleasantly confused this session, requires some redirection and cues for command following as well as sequencing during ADL. Pt requiring minA for room level functional mobility without AD, pt with x1 LOB while standing at sink requiring minA to correct. She currently requires minA for standing grooming and LB ADL. Pt will benefit from continued acute OT services, recommend follow up [facility] services and recommend she have 24hr hands on assist initially after discharge to maximize her safety and independence with ADL and mobility. Will follow.     Follow Up Recommendations  Home health OT;Supervision/Assistance - 24 hour    Equipment Recommendations  3 in 1 bedside commode(for use in shower)    Recommendations for Other Services       Precautions / Restrictions Precautions Precautions: Fall Restrictions Weight Bearing Restrictions: No      Mobility Bed Mobility Overal bed mobility: Needs Assistance Bed Mobility: Supine to Sit     Supine to sit: Min guard     General bed mobility comments: for safety  Transfers Overall transfer level: Needs assistance Equipment used: None Transfers: Sit to/from Stand Sit to Stand: Min guard         General transfer comment: assist  for safety    Balance Overall balance assessment: Needs assistance   Sitting balance-Maria Clarke: Good     Standing balance support: No upper extremity supported Standing balance-Maria Clarke: Fair Standing balance comment: minguard-minA, x1 LOB to the R while standing at sink during grooming ADL                           ADL either performed or assessed with clinical judgement   ADL Overall ADL's : Needs assistance/impaired Eating/Feeding: Modified independent;Sitting   Grooming: Minimal assistance;Standing;Oral care;Wash/dry hands Grooming Details (indicate cue type and reason): required assist to open grooming items and properly start task, pt did not have her glasses on at the time so question whether vision was playing a part in difficulties with task completion Upper Body Bathing: Min guard;Sitting   Lower Body Bathing: Minimal assistance;Sit to/from stand   Upper Body Dressing : Set up;Min guard;Sitting   Lower Body Dressing: Minimal assistance;Sit to/from stand   Toilet Transfer: Minimal assistance;Ambulation Toilet Transfer Details (indicate cue type and reason): simulated via transfer to recliner, room level mobility Toileting- Clothing Manipulation and Hygiene: Minimal assistance;Sit to/from stand       Functional mobility during ADLs: Minimal assistance General ADL Comments: pt with impaired balance, cognition, weakness     Vision Baseline Vision/History: Wears glasses Wears Glasses: At all times Vision Assessment?: Vision impaired- to be further tested in functional context Additional Comments: pt not wearing her g lasses initially during fucntional tasks and she had significant difficulty finding/manipulating items - question whether her performance would improve if she were to have glasses on  Perception     Praxis      Pertinent Vitals/Pain Pain Assessment: No/denies pain     Hand Dominance     Extremity/Trunk Assessment Upper  Extremity Assessment Upper Extremity Assessment: Generalized weakness;LUE deficits/detail LUE Deficits / Details: decreased coordination noted in LUE LUE Coordination: decreased fine motor   Lower Extremity Assessment Lower Extremity Assessment: Defer to PT evaluation   Cervical / Trunk Assessment Cervical / Trunk Assessment: Kyphotic   Communication Communication Communication: No difficulties   Cognition Arousal/Alertness: Awake/alert Behavior During Therapy: WFL for tasks assessed/performed Overall Cognitive Status: No family/caregiver present to determine baseline cognitive functioning                                 General Comments: history of dementia, knows she is at [facility], but not why she is here till educated, pleasant, but confused   General Comments       Exercises     Shoulder Instructions      Home Living Family/patient expects to be discharged to:: Private residence Living Arrangements: Children Available Help at Discharge: Family;Available PRN/intermittently Type of Home: House Home Access: Level entry     Home Layout: One level     Bathroom Shower/Tub: Teacher, early years/pre: Standard     Home Equipment: Other (comment)   Additional Comments: not sure of home equipment      Prior Functioning/Environment Level of Independence: Needs assistance    ADL's / Homemaking Assistance Needed: lives with sons, [name] has help for IADL's   Comments: sons work, sometimes she goes to gas station with them, but at times home alone        OT Problem List: Decreased strength;Decreased range of motion;Decreased activity tolerance;Impaired balance (sitting and/or standing);Decreased cognition;Decreased safety awareness;Decreased knowledge of use of DME or AE;Decreased knowledge of precautions;Decreased coordination      OT Treatment/Interventions: Self-care/ADL training;Therapeutic exercise;Neuromuscular education;DME and/or AE  instruction;Therapeutic activities;Cognitive remediation/compensation;Patient/family education;Balance training;Energy conservation    OT Goals(Current goals can be found in the care plan section) Acute Rehab OT Goals Patient Stated Goal: Return home to her cats OT Goal Formulation: With patient Time For Goal Achievement: 05/27/19 Potential to Achieve Goals: Good  OT Frequency: Min 2X/week   Barriers to D/C:            Co-evaluation              AM-PAC OT "6 Clicks" Daily Activity     Outcome Measure Help from another person eating meals?: None Help from another person taking care of personal grooming?: A Little Help from another person toileting, which includes using toliet, bedpan, or urinal?: A Little Help from another person bathing (including washing, rinsing, drying)?: A Little Help from another person to put on and taking off regular upper body clothing?: None Help from another person to put on and taking off regular lower body clothing?: A Little 6 Click Score: 20   End of Session Equipment Utilized During Treatment: Gait belt Nurse Communication: Mobility status  Activity Tolerance: Patient tolerated treatment well Patient left: in chair;with call bell/phone within reach;with chair alarm set  OT Visit Diagnosis: Muscle weakness (generalized) (M62.81);Unsteadiness on feet (R26.81);Other symptoms and signs involving cognitive function                Time: 1136-1200 OT Time Calculation (min): 24 min Charges:  OT General Charges $OT Visit: 1 Visit OT Evaluation $OT Eval  Moderate Complexity: 1 Mod OT Treatments $Self Care/Home Management : 8-22 mins  Lou Cal, OT [facility] Pager [phone] Office [phone]    Raymondo Band 05/13/2019, 2:26 PM

## 2019-05-13 NOTE — Progress Notes (Signed)
NAME:  Maria Clarke, MRN:  [id number], DOB:  1921/09/27, LOS: 0 ADMISSION DATE:  05/12/2019,Primary: [name], NP  CHIEF COMPLAINT:  Neurological changes  Medical Service: Internal Medicine Teaching Service         Attending Physician: Dr. Lucious Groves, DO    First Contact: Dr. Darrick Meigs Pager: [phone]  Second Contact: Dr. Koleen Distance Pager: [phone]       After Hours (After 5p/  First Contact Pager: [phone]  weekends / holidays): Second Contact Pager: [phone]    Brief History  85 yo female with PMH of right carotid stenosis (60-79%), htn, hyperlipidemia, hypothyroidism, and dementia who presented with acute episode of slurred speech and gait difficulty witnessed by the son who called EMS. Sx had resolved by the time EMS arrived. NIH on arrival to ED was 2. CT head on admission was neg for acute findings.  Subjective  Getting out of bed overnight. No neurological changes noted aside from confusion. No complaints this morning.  Objective   Blood pressure (!) 155/76, pulse 74, temperature 98.5 F (36.9 C), temperature source Oral, resp. rate 16, height 5' (1.524 m), weight 63.7 kg, SpO2 97 %.     Examination: GENERAL: in no acute distress CARDIAC: heart RRR. No peripheral edema.  PULMONARY: acyanotic. Lung sounds clear to auscultation. ABDOMEN: bs active NEURO: CN II-XII grossly intact. Continues to remain alert and intermittently oriented to place.  SKIN: no rash or lesions on limited exam   Consults:  Neuro  Significant Diagnostic Tests:  11/3 CTA head/neck: 65% stenosis of right internal carotid artery. Decreased conspicuity of right MCA branch vessels suggesting decreased perfusion however no specific cutoff noted. Occlusion of proximal left posterior cerebral artery with prominent collateral.  11/3 MRI brain diffusion only: no acute infarct. Extensive chronic small vessel ischemic disease. Moderate cerebral atrophy.  Micro Data:  UC>>  Labs    CBC Latest  Ref Rng & Units 05/12/2019 05/12/2019 12/05/2016  WBC 4.0 - 10.5 K/uL - 9.5 7.0  Hemoglobin 12.0 - 15.0 g/dL 13.9 13.8 15.0  Hematocrit 36.0 - 46.0 % 41.0 42.4 43.8  Platelets 150 - 400 K/uL - 237 154.0   BMP Latest Ref Rng & Units 05/12/2019 05/12/2019 12/05/2016  Glucose 70 - 99 mg/dL 165(H) 172(H) 98  BUN 8 - 23 mg/dL 17 16 16   Creatinine 0.44 - 1.00 mg/dL 1.00 1.05(H) 0.73  Sodium 135 - 145 mmol/L 141 139 142  Potassium 3.5 - 5.1 mmol/L 3.3(L) 3.3(L) 4.0  Chloride 98 - 111 mmol/L 103 102 107  CO2 22 - 32 mmol/L - 23 27  Calcium 8.9 - 10.3 mg/dL - 9.5 10.0     Summary  85 yo female with hypertension, HLD, atrial tachycardia and carotid stenosis who presented to ED on 11/3 for a transient episode of slurred speech and gait instability which resolved prior to arrival to ED. No findings on imaging suggestive of stroke.  Assessment & Plan:  Active Problems:   TIA (transient ischemic attack)  TIA. Appreciate neurology's recommendations.  No neuro changes overnight. Blood pressures within range A1C 5.8. lipids pending Passed speech eval Echo pending Plan Continue DAPT, lipitor Continuous tele Allow for permissive hypertension through today. Goal SBP 140-254mmHg.  PT/OT  HTN: please see above for permissive htn goals HLD: 80mg  lipitor  Dementia: aricept Depression: lexapro  Hypothyroidism. Repeat TSH 0.33. unlikely to be contributing to current hospital issue. Plan: Continue synthroid. Will outpatient follow up for further management.  Best practice:  CODE STATUS: FULL  Diet: cardiac DVT for prophylaxis: lovenox Dispo: Could likely discharge later today pending echo results and pending neuro recs   Mitzi Hansen, MD [place] PGY-1 PAGER #: [phone] 05/13/19 11:34 AM

## 2019-05-14 ENCOUNTER — Emergency Department (HOSPITAL_COMMUNITY): Payer: Medicare Other

## 2019-05-14 ENCOUNTER — Inpatient Hospital Stay (HOSPITAL_COMMUNITY)
Admission: EM | Admit: 2019-05-14 | Discharge: 2019-05-18 | DRG: 065 | Disposition: A | Discharge status: Home Health | Payer: Medicare Other | Attending: Internal Medicine | Admitting: Internal Medicine

## 2019-05-14 ENCOUNTER — Other Ambulatory Visit: Payer: Self-pay

## 2019-05-14 ENCOUNTER — Encounter (HOSPITAL_COMMUNITY): Payer: Self-pay | Admitting: Emergency Medicine

## 2019-05-14 ENCOUNTER — Ambulatory Visit: Payer: Self-pay | Admitting: Adult Health

## 2019-05-14 DIAGNOSIS — R4189 Other symptoms and signs involving cognitive functions and awareness: Secondary | ICD-10-CM | POA: Diagnosis present

## 2019-05-14 DIAGNOSIS — I6529 Occlusion and stenosis of unspecified carotid artery: Secondary | ICD-10-CM | POA: Diagnosis present

## 2019-05-14 DIAGNOSIS — F05 Delirium due to known physiological condition: Secondary | ICD-10-CM | POA: Diagnosis present

## 2019-05-14 DIAGNOSIS — G301 Alzheimer's disease with late onset: Secondary | ICD-10-CM

## 2019-05-14 DIAGNOSIS — I959 Hypotension, unspecified: Secondary | ICD-10-CM | POA: Diagnosis not present

## 2019-05-14 DIAGNOSIS — I10 Essential (primary) hypertension: Secondary | ICD-10-CM | POA: Diagnosis not present

## 2019-05-14 DIAGNOSIS — Z66 Do not resuscitate: Secondary | ICD-10-CM | POA: Diagnosis present

## 2019-05-14 DIAGNOSIS — E785 Hyperlipidemia, unspecified: Secondary | ICD-10-CM | POA: Diagnosis present

## 2019-05-14 DIAGNOSIS — I6523 Occlusion and stenosis of bilateral carotid arteries: Secondary | ICD-10-CM

## 2019-05-14 DIAGNOSIS — F0390 Unspecified dementia without behavioral disturbance: Secondary | ICD-10-CM | POA: Diagnosis present

## 2019-05-14 DIAGNOSIS — F0280 Dementia in other diseases classified elsewhere without behavioral disturbance: Secondary | ICD-10-CM

## 2019-05-14 DIAGNOSIS — I2510 Atherosclerotic heart disease of native coronary artery without angina pectoris: Secondary | ICD-10-CM | POA: Diagnosis present

## 2019-05-14 DIAGNOSIS — R29703 NIHSS score 3: Secondary | ICD-10-CM | POA: Diagnosis present

## 2019-05-14 DIAGNOSIS — I63532 Cerebral infarction due to unspecified occlusion or stenosis of left posterior cerebral artery: Secondary | ICD-10-CM | POA: Diagnosis not present

## 2019-05-14 DIAGNOSIS — I639 Cerebral infarction, unspecified: Secondary | ICD-10-CM | POA: Diagnosis not present

## 2019-05-14 DIAGNOSIS — H538 Other visual disturbances: Secondary | ICD-10-CM | POA: Diagnosis not present

## 2019-05-14 DIAGNOSIS — F329 Major depressive disorder, single episode, unspecified: Secondary | ICD-10-CM | POA: Diagnosis present

## 2019-05-14 DIAGNOSIS — Z20828 Contact with and (suspected) exposure to other viral communicable diseases: Secondary | ICD-10-CM | POA: Diagnosis present

## 2019-05-14 DIAGNOSIS — Z833 Family history of diabetes mellitus: Secondary | ICD-10-CM

## 2019-05-14 DIAGNOSIS — E039 Hypothyroidism, unspecified: Secondary | ICD-10-CM | POA: Diagnosis present

## 2019-05-14 DIAGNOSIS — Z79890 Hormone replacement therapy (postmenopausal): Secondary | ICD-10-CM

## 2019-05-14 DIAGNOSIS — Z8249 Family history of ischemic heart disease and other diseases of the circulatory system: Secondary | ICD-10-CM

## 2019-05-14 DIAGNOSIS — Z7902 Long term (current) use of antithrombotics/antiplatelets: Secondary | ICD-10-CM

## 2019-05-14 DIAGNOSIS — Z79899 Other long term (current) drug therapy: Secondary | ICD-10-CM

## 2019-05-14 DIAGNOSIS — Z03818 Encounter for observation for suspected exposure to other biological agents ruled out: Secondary | ICD-10-CM | POA: Diagnosis not present

## 2019-05-14 LAB — CBC
HCT: 44 % (ref 36.0–46.0)
Hemoglobin: 14.3 g/dL (ref 12.0–15.0)
MCH: 30.2 pg (ref 26.0–34.0)
MCHC: 32.5 g/dL (ref 30.0–36.0)
MCV: 92.8 fL (ref 80.0–100.0)
Platelets: 244 10*3/uL (ref 150–400)
RBC: 4.74 MIL/uL (ref 3.87–5.11)
RDW: 13.6 % (ref 11.5–15.5)
WBC: 9.5 10*3/uL (ref 4.0–10.5)
nRBC: 0 % (ref 0.0–0.2)

## 2019-05-14 LAB — I-STAT CHEM 8, ED
BUN: 26 mg/dL — ABNORMAL HIGH (ref 8–23)
Calcium, Ion: 1.28 mmol/L (ref 1.15–1.40)
Chloride: 104 mmol/L (ref 98–111)
Creatinine, Ser: 1 mg/dL (ref 0.44–1.00)
Glucose, Bld: 123 mg/dL — ABNORMAL HIGH (ref 70–99)
HCT: 42 % (ref 36.0–46.0)
Hemoglobin: 14.3 g/dL (ref 12.0–15.0)
Potassium: 3.4 mmol/L — ABNORMAL LOW (ref 3.5–5.1)
Sodium: 143 mmol/L (ref 135–145)
TCO2: 27 mmol/L (ref 22–32)

## 2019-05-14 LAB — COMPREHENSIVE METABOLIC PANEL
ALT: 13 U/L (ref 0–44)
AST: 23 U/L (ref 15–41)
Albumin: 3.4 g/dL — ABNORMAL LOW (ref 3.5–5.0)
Alkaline Phosphatase: 58 U/L (ref 38–126)
Anion gap: 12 (ref 5–15)
BUN: 23 mg/dL (ref 8–23)
CO2: 24 mmol/L (ref 22–32)
Calcium: 9.3 mg/dL (ref 8.9–10.3)
Chloride: 104 mmol/L (ref 98–111)
Creatinine, Ser: 1.16 mg/dL — ABNORMAL HIGH (ref 0.44–1.00)
GFR calc Af Amer: 46 mL/min — ABNORMAL LOW (ref >60)
GFR calc non Af Amer: 40 mL/min — ABNORMAL LOW (ref >60)
Glucose, Bld: 133 mg/dL — ABNORMAL HIGH (ref 70–99)
Potassium: 3.4 mmol/L — ABNORMAL LOW (ref 3.5–5.1)
Sodium: 140 mmol/L (ref 135–145)
Total Bilirubin: 0.7 mg/dL (ref 0.3–1.2)
Total Protein: 6.4 g/dL — ABNORMAL LOW (ref 6.5–8.1)

## 2019-05-14 LAB — SARS CORONAVIRUS 2 (TAT 6-24 HRS): SARS Coronavirus 2: NEGATIVE

## 2019-05-14 LAB — URINE CULTURE: Culture: <10000 — AB

## 2019-05-14 LAB — DIFFERENTIAL
Abs Immature Granulocytes: 0.05 10*3/uL (ref 0.00–0.07)
Basophils Absolute: 0.1 10*3/uL (ref 0.0–0.1)
Basophils Relative: 1 %
Eosinophils Absolute: 0.1 10*3/uL (ref 0.0–0.5)
Eosinophils Relative: 1 %
Immature Granulocytes: 1 %
Lymphocytes Relative: 16 %
Lymphs Abs: 1.5 10*3/uL (ref 0.7–4.0)
Monocytes Absolute: 0.9 10*3/uL (ref 0.1–1.0)
Monocytes Relative: 10 %
Neutro Abs: 6.9 10*3/uL (ref 1.7–7.7)
Neutrophils Relative %: 71 %

## 2019-05-14 LAB — PROTIME-INR
INR: 1 (ref 0.8–1.2)
Prothrombin Time: 13.1 seconds (ref 11.4–15.2)

## 2019-05-14 LAB — APTT: aPTT: 27 seconds (ref 24–36)

## 2019-05-14 MED ORDER — LEVOTHYROXINE SODIUM 100 MCG PO TABS
100.0000 ug | ORAL_TABLET | Freq: Every day | ORAL | Status: DC
  Administered 2019-05-15: 07:00:00 100 ug via ORAL
  Filled 2019-05-14: qty 1

## 2019-05-14 MED ORDER — DONEPEZIL HCL 5 MG PO TABS
5.0000 mg | ORAL_TABLET | Freq: Every day | ORAL | Status: DC
  Administered 2019-05-14 – 2019-05-16 (×3): 5 mg via ORAL
  Filled 2019-05-14 (×5): qty 1

## 2019-05-14 MED ORDER — ACETAMINOPHEN 325 MG PO TABS: 650.0000 mg | ORAL_TABLET | ORAL | Status: DC | PRN

## 2019-05-14 MED ORDER — ACETAMINOPHEN 160 MG/5ML PO SOLN: 650.0000 mg | ORAL | Status: DC | PRN

## 2019-05-14 MED ORDER — SODIUM CHLORIDE 0.9% FLUSH: 3.0000 mL | Freq: Once | INTRAVENOUS | Status: DC

## 2019-05-14 MED ORDER — CLOPIDOGREL BISULFATE 75 MG PO TABS
75.0000 mg | ORAL_TABLET | Freq: Every day | ORAL | Status: DC
  Administered 2019-05-15 – 2019-05-18 (×4): 75 mg via ORAL
  Filled 2019-05-14 (×4): qty 1

## 2019-05-14 MED ORDER — LORAZEPAM 2 MG/ML IJ SOLN
0.5000 mg | Freq: Once | INTRAMUSCULAR | Status: AC | PRN
  Administered 2019-05-15: 01:00:00 0.5 mg via INTRAMUSCULAR
  Filled 2019-05-14: qty 1

## 2019-05-14 MED ORDER — ACETAMINOPHEN 650 MG RE SUPP: 650.0000 mg | RECTAL | Status: DC | PRN

## 2019-05-14 MED ORDER — ASPIRIN 325 MG PO TABS
325.0000 mg | ORAL_TABLET | Freq: Every day | ORAL | Status: DC
  Administered 2019-05-14 – 2019-05-18 (×5): 325 mg via ORAL
  Filled 2019-05-14 (×5): qty 1

## 2019-05-14 MED ORDER — ENOXAPARIN SODIUM 30 MG/0.3ML ~~LOC~~ SOLN
30.0000 mg | SUBCUTANEOUS | Status: DC
  Administered 2019-05-14 – 2019-05-17 (×4): 30 mg via SUBCUTANEOUS
  Filled 2019-05-14 (×4): qty 0.3

## 2019-05-14 MED ORDER — ESCITALOPRAM OXALATE 10 MG PO TABS
5.0000 mg | ORAL_TABLET | Freq: Every day | ORAL | Status: DC
  Administered 2019-05-14 – 2019-05-16 (×3): 5 mg via ORAL
  Filled 2019-05-14 (×4): qty 1

## 2019-05-14 MED ORDER — ATORVASTATIN CALCIUM 80 MG PO TABS
80.0000 mg | ORAL_TABLET | Freq: Every day | ORAL | Status: DC
  Administered 2019-05-14 – 2019-05-17 (×4): 80 mg via ORAL
  Filled 2019-05-14 (×4): qty 1

## 2019-05-14 MED ORDER — STROKE: EARLY STAGES OF RECOVERY BOOK
Freq: Once | Status: AC
  Administered 2019-05-14: 1
  Filled 2019-05-14: qty 1

## 2019-05-14 NOTE — Telephone Encounter (Signed)
Pt currently at ED.  Will close note.

## 2019-05-14 NOTE — Consult Note (Signed)
Stroke Neurology Consultation Note  Consult Requested by: Charlesetta Garibaldi, MD, [place]  Reason for Consult: stroke  Consult Date:  05/14/19  The history was obtained from the pt, MD and chart.  During history and examination, all items were able to obtain unless otherwise noted.  History of Present Illness:  Maria Clarke is an 85 y.o. Caucasian female with PMH of atrial tachycardia but no atrial fibrillation documented, symptomatic R ICA stenosis s/p right CAS, cognitive impairment, depression, hypothyroidism, HTN, HLD was discharged yesterday 11/4 after transient slurred speech and confusion who had full stroke workup. She had MRI which was negative for acute stroke, CTA head and neck showed right ICA 65% stenosis with calcified plaque, moderate right M1 stenosis and decreased right MCA perfusion.  EF 65 to 70%.  LDL 174 and A1c 5.8.  Put on DAPT with aspirin 325 and Plavix 75 for 3 months and Lipitor 80.  Recommend close follow-up with vascular surgery.  Patient was sent back to ER today presenting with worsened confusion and memory as well as worsening of her gait.   In ER she was found to have new R visual field defect. CT head shows intveral L occipital infarct with known L PCA occlusion from previous CTA.  BP on ER presentation recorded at 105/53. Patient has cognitive impairment and incoherent speech, not able to provide detailed history.  Current history was taken from her chart.   tPA Given: No: Unclear time onset MRS:  3 NIHSS:  3  Past Medical History:  Diagnosis Date  . Atrial tachycardia ([place])    a. Noted 02/2013 on EKG.  . Carotid artery disease ([place])    Carotid duplex showed [id number] RICA and [id number] LICA  . Depression   . Hypercholesteremia   . Hypertension   . Hypothyroidism      Past Surgical History:  Procedure Laterality Date  . BREAST SURGERY Left    Benign Tumor- several years ago  . CESAREAN SECTION    . EYE SURGERY Bilateral    cataract    Family History   Problem Relation Age of Onset  . Diabetes Mother   . Heart disease Mother        After age 40  . Hyperlipidemia Mother   . Hypertension Mother   . Heart disease Sister        After age 25  . Asthma Sister   . Hyperlipidemia Sister   . Heart disease Brother        After age 63  . Diabetes Son   . Hypertension Son      Social History:  reports that she has never smoked. She has never used smokeless tobacco. She reports that she does not drink alcohol or use drugs.  Review of Systems: A full ROS was attempted today and was able to be performed.  Systems assessed include - Constitutional, Eyes, HENT, Respiratory, Cardiovascular, Gastrointestinal, Genitourinary, Integument/breast, Hematologic/lymphatic, Musculoskeletal, Neurological, Behavioral/Psych, Endocrine, Allergic/Immunologic - with pertinent responses as per HPI.  Allergies:  Allergies  Allergen Reactions  . Sulfonamide Derivatives Other (See Comments)    "ORAL S.E." (family unaware what time means)     Test Results: CBC:  Recent Labs  Lab 05/12/19 1054  05/14/19 1013 05/14/19 1033  WBC 9.5  --  9.5  --   NEUTROABS 6.9  --  6.9  --   HGB 13.8   < > 14.3 14.3  HCT 42.4   < > 44.0 42.0  MCV 93.6  --  92.8  --   PLT 237  --  244  --    < > = values in this interval not displayed.   Basic Metabolic Panel:  Recent Labs  Lab 05/12/19 1054  05/14/19 1013 05/14/19 1033  NA 139   < > 140 143  K 3.3*   < > 3.4* 3.4*  CL 102   < > 104 104  CO2 23  --  24  --   GLUCOSE 172*   < > 133* 123*  BUN 16   < > 23 26*  CREATININE 1.05*   < > 1.16* 1.00  CALCIUM 9.5  --  9.3  --    < > = values in this interval not displayed.   Liver Function Tests: Recent Labs  Lab 05/12/19 1054 05/14/19 1013  AST 21 23  ALT 12 13  ALKPHOS 58 58  BILITOT 0.4 0.7  PROT 6.2* 6.4*  ALBUMIN 3.3* 3.4*   No results for input(s): LIPASE, AMYLASE in the last 168 hours. No results for input(s): AMMONIA in the last 168  hours. Coagulation Studies:  Recent Labs    05/12/19 1054 05/14/19 1013  LABPROT 13.6 13.1  INR 1.1 1.0   Cardiac Enzymes: No results for input(s): CKTOTAL, CKMB, CKMBINDEX, TROPONINI in the last 168 hours. BNP: Invalid input(s): POCBNP CBG:  Recent Labs  Lab 05/12/19 1057 05/13/19 0130  GLUCAP 169* 116*   Urinalysis:  Recent Labs  Lab 05/12/19 1243  COLORURINE YELLOW  LABSPEC 1.033*  PHURINE 6.0  GLUCOSEU NEGATIVE  HGBUR SMALL*  BILIRUBINUR NEGATIVE  KETONESUR NEGATIVE  PROTEINUR NEGATIVE  NITRITE NEGATIVE  LEUKOCYTESUR NEGATIVE   Microbiology:  Results for orders placed or performed during the hospital encounter of 05/12/19  SARS CORONAVIRUS 2 (TAT 6-24 HRS) Nasopharyngeal Nasopharyngeal Swab     Status: None   Collection Time: 05/12/19  4:44 PM   Specimen: Nasopharyngeal Swab  Result Value Ref Range Status   SARS Coronavirus 2 NEGATIVE NEGATIVE Final    Comment: (NOTE) SARS-CoV-2 target nucleic acids are NOT DETECTED. The SARS-CoV-2 RNA is generally detectable in upper and lower respiratory specimens during the acute phase of infection. Negative results do not preclude SARS-CoV-2 infection, do not rule out co-infections with other pathogens, and should not be used as the sole basis for treatment or other patient management decisions. Negative results must be combined with clinical observations, patient history, and epidemiological information. The expected result is Negative. Fact Sheet for Patients: SugarRoll.be Fact Sheet for Healthcare Providers: https://www.woods-mathews.com/ This test is not yet approved or cleared by the [place] FDA and  has been authorized for detection and/or diagnosis of SARS-CoV-2 by FDA under an Emergency Use Authorization (EUA). This EUA will remain  in effect (meaning this test can be used) for the duration of the COVID-19 declaration under Section 56 4(b)(1) of the Act, 21  U.S.C. section 360bbb-3(b)(1), unless the authorization is terminated or revoked sooner. Performed at [facility], [place] [address] [place], [place] [address]   Culture, Urine     Status: Abnormal   Collection Time: 05/13/19  8:43 AM   Specimen: Urine, Clean Catch  Result Value Ref Range Status   Specimen Description URINE, CLEAN CATCH  Final   Special Requests NONE  Final   Culture (A)  Final    <10,000 COLONIES/mL INSIGNIFICANT GROWTH Performed at [facility], [place] [address], [place], [place] [address]    Report Status 05/14/2019 FINAL  Final  Lipid Panel:     Component Value Date/Time   CHOL 265 (H) 05/13/2019 0414   TRIG 128 05/13/2019 0414   HDL 65 05/13/2019 0414   CHOLHDL 4.1 05/13/2019 0414   VLDL 26 05/13/2019 0414   LDLCALC 174 (H) 05/13/2019 0414   HgbA1c:  Lab Results  Component Value Date   HGBA1C 5.8 (H) 05/13/2019   Urine Drug Screen:     Component Value Date/Time   LABOPIA NONE DETECTED 05/12/2019 1243   COCAINSCRNUR NONE DETECTED 05/12/2019 1243   LABBENZ NONE DETECTED 05/12/2019 1243   AMPHETMU NONE DETECTED 05/12/2019 1243   THCU NONE DETECTED 05/12/2019 1243   LABBARB NONE DETECTED 05/12/2019 1243    Alcohol Level:  Recent Labs  Lab 05/12/19 1054  ETH <10    Ct Angio Head W Or Wo Contrast  Result Date: 05/12/2019 CLINICAL DATA:  Dysarthria and ataxia. EXAM: CT ANGIOGRAPHY HEAD AND NECK TECHNIQUE: Multidetector CT imaging of the head and neck was performed using the standard protocol during bolus administration of intravenous contrast. Multiplanar CT image reconstructions and MIPs were obtained to evaluate the vascular anatomy. Carotid stenosis measurements (when applicable) are obtained utilizing NASCET criteria, using the distal internal carotid diameter as the denominator. CONTRAST:  121mL OMNIPAQUE IOHEXOL 350 MG/ML SOLN COMPARISON:  CT head without contrast 05/12/2019. FINDINGS: CTA NECK FINDINGS Aortic arch: Atherosclerotic  calcifications are present in the aortic arch. There is mild narrowing at the origin of the left subclavian artery. A 3 vessel arch configuration is present. There is no significant stenosis of the great vessel origins. Right carotid system: Right common carotid artery is within limits. Prominent calcified and noncalcified plaque is present at the right carotid bifurcation. The lumen is narrowed to 1.5 mm. This compares with a more distal measurement of 4.3 mm. There is some tortuosity of the cervical right ICA without significant stenosis. Left carotid system: The left common carotid artery is within normal limits. Atherosclerotic calcifications are present at the bifurcation without significant stenosis. The cervical left ICA is otherwise normal. Vertebral arteries: The vertebral arteries are codominant. There is some calcification at the origin of both vertebral arteries at the subclavian arteries without significant stenosis. There is no significant stenosis of either vertebral artery in the neck. Skeleton: Degenerative changes are present in the cervical spine. Vertebral body heights are maintained. No focal lytic or blastic lesions are present. Other neck: Heterogeneous thyroid is present without a dominant nodule. No significant adenopathy. Salivary glands are within normal limits. No focal mucosal or submucosal lesions are present. Upper chest: Lung apices are clear without focal nodule, mass, or airspace disease. Thoracic inlet is normal. Review of the MIP images confirms the above findings CTA HEAD FINDINGS Anterior circulation: Atherosclerotic calcifications are present in the cavernous internal carotid arteries bilaterally without significant stenosis through the ICA termini. The A1 segments are within normal limits. Anterior communicating artery is patent. There is moderate narrowing of the distal right M1 segment, just proximal to the bifurcation. The left M1 segment is normal. There is moderate  stenosis in the pericallosal arteries bilaterally. The MCA bifurcations are intact. There is segmental narrowing within MCA branch vessels bilaterally without a significant proximal stenosis or occlusion. Branch vessels are more robust left than right. This suggests some degree decreased perfusion to the right MCA branches. Posterior circulation: The vertebral arteries are codominant. PICA origins are visualized and normal. The basilar artery is normal. Both posterior cerebral arteries originate basilar tip. There is a high-grade stenosis in the  proximal right P2 segment. The left PCA is occluded proximally with extensive collaterals. This may be chronic. Venous sinuses: Dural sinuses are patent. The left transverse sinus is dominant. The straight sinus deep cerebral veins are intact. Cortical veins are unremarkable. Anatomic variants: None Review of the MIP images confirms the above findings IMPRESSION: 1. 65% stenosis of the right internal carotid artery with mixed calcified and noncalcified plaque. 2. Moderate distal right M1 segment stenosis. 3. Decreased conspicuity of right MCA branch vessels compared to the left, suggesting decreased perfusion. 4. Occlusion of the proximal left posterior cerebral artery with prominent collaterals. This may be a chronic occlusion. 5. High-grade stenosis of the proximal right P2 segment with significant collaterals. 6. Additional atherosclerotic changes at the aortic arch left carotid bifurcation, vertebral artery origins, and bilateral cavernous internal carotid arteries without other significant stenosis. Aortic Atherosclerosis (ICD10-I70.0). These results were called by telephone at the time of interpretation on 05/12/2019 at 11:38 am to provider [facility] , who verbally acknowledged these results. Electronically Signed   By: San Morelle M.D.   On: 05/12/2019 11:49   Ct Head Wo Contrast  Result Date: 05/14/2019 CLINICAL DATA:  Altered level of consciousness  EXAM: CT HEAD WITHOUT CONTRAST TECHNIQUE: Contiguous axial images were obtained from the base of the skull through the vertex without intravenous contrast. COMPARISON:  None. FINDINGS: Brain: New area of hypoattenuation with loss of gray-white differentiation in the left occipital lobe reflecting likely late acute infarction. There is no acute intracranial hemorrhage. Stable prominence of ventricles and sulci reflecting parenchymal volume loss. Patchy confluent areas of hypoattenuation supratentorial white matter nonspecific probably reflects stable advanced chronic microvascular ischemic changes. No extra-axial fluid collection. Vascular: Atherosclerotic calcification at the skull base. Skull: Calvarium is unremarkable. Sinuses/Orbits: Paranasal sinuses are aerated orbits are unremarkable. Other: Partial opacification of the right mastoid tip. 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 Of note, left PCA was occluded on recent CTA. No acute intracranial hemorrhage. Stable chronic findings detailed above. Electronically Signed   By: Macy Mis M.D.   On: 05/14/2019 10:56   Ct Angio Neck W Or Wo Contrast  Result Date: 05/12/2019 CLINICAL DATA:  Dysarthria and ataxia. EXAM: CT ANGIOGRAPHY HEAD AND NECK TECHNIQUE: Multidetector CT imaging of the head and neck was performed using the standard protocol during bolus administration of intravenous contrast. Multiplanar CT image reconstructions and MIPs were obtained to evaluate the vascular anatomy. Carotid stenosis measurements (when applicable) are obtained utilizing NASCET criteria, using the distal internal carotid diameter as the denominator. CONTRAST:  184mL OMNIPAQUE IOHEXOL 350 MG/ML SOLN COMPARISON:  CT head without contrast 05/12/2019. FINDINGS: CTA NECK FINDINGS Aortic arch: Atherosclerotic calcifications are present in the aortic arch. There is mild narrowing at the origin of the left subclavian artery. A 3 vessel  arch configuration is present. There is no significant stenosis of the great vessel origins. Right carotid system: Right common carotid artery is within limits. Prominent calcified and noncalcified plaque is present at the right carotid bifurcation. The lumen is narrowed to 1.5 mm. This compares with a more distal measurement of 4.3 mm. There is some tortuosity of the cervical right ICA without significant stenosis. Left carotid system: The left common carotid artery is within normal limits. Atherosclerotic calcifications are present at the bifurcation without significant stenosis. The cervical left ICA is otherwise normal. Vertebral arteries: The vertebral arteries are codominant. There is some calcification at the origin of both vertebral arteries at the subclavian arteries without  significant stenosis. There is no significant stenosis of either vertebral artery in the neck. Skeleton: Degenerative changes are present in the cervical spine. Vertebral body heights are maintained. No focal lytic or blastic lesions are present. Other neck: Heterogeneous thyroid is present without a dominant nodule. No significant adenopathy. Salivary glands are within normal limits. No focal mucosal or submucosal lesions are present. Upper chest: Lung apices are clear without focal nodule, mass, or airspace disease. Thoracic inlet is normal. Review of the MIP images confirms the above findings CTA HEAD FINDINGS Anterior circulation: Atherosclerotic calcifications are present in the cavernous internal carotid arteries bilaterally without significant stenosis through the ICA termini. The A1 segments are within normal limits. Anterior communicating artery is patent. There is moderate narrowing of the distal right M1 segment, just proximal to the bifurcation. The left M1 segment is normal. There is moderate stenosis in the pericallosal arteries bilaterally. The MCA bifurcations are intact. There is segmental narrowing within MCA branch  vessels bilaterally without a significant proximal stenosis or occlusion. Branch vessels are more robust left than right. This suggests some degree decreased perfusion to the right MCA branches. Posterior circulation: The vertebral arteries are codominant. PICA origins are visualized and normal. The basilar artery is normal. Both posterior cerebral arteries originate basilar tip. There is a high-grade stenosis in the proximal right P2 segment. The left PCA is occluded proximally with extensive collaterals. This may be chronic. Venous sinuses: Dural sinuses are patent. The left transverse sinus is dominant. The straight sinus deep cerebral veins are intact. Cortical veins are unremarkable. Anatomic variants: None Review of the MIP images confirms the above findings IMPRESSION: 1. 65% stenosis of the right internal carotid artery with mixed calcified and noncalcified plaque. 2. Moderate distal right M1 segment stenosis. 3. Decreased conspicuity of right MCA branch vessels compared to the left, suggesting decreased perfusion. 4. Occlusion of the proximal left posterior cerebral artery with prominent collaterals. This may be a chronic occlusion. 5. High-grade stenosis of the proximal right P2 segment with significant collaterals. 6. Additional atherosclerotic changes at the aortic arch left carotid bifurcation, vertebral artery origins, and bilateral cavernous internal carotid arteries without other significant stenosis. Aortic Atherosclerosis (ICD10-I70.0). These results were called by telephone at the time of interpretation on 05/12/2019 at 11:38 am to provider [facility] , who verbally acknowledged these results. Electronically Signed   By: San Morelle M.D.   On: 05/12/2019 11:49   Mr Brain Wo Contrast  Result Date: 05/12/2019 CLINICAL DATA:  Transient aphasia/slurred speech and right leg weakness. EXAM: MRI HEAD WITHOUT CONTRAST TECHNIQUE: Multiplanar, multiecho pulse sequences of the brain and  surrounding structures were obtained without intravenous contrast. COMPARISON:  Head CT and CTA 05/12/2019 FINDINGS: The patient terminated the examination prior to completion. Only axial and coronal diffusion sequences were obtained. No acute or early subacute infarct is evident. Confluent T2 hyperintensities in the cerebral white matter bilaterally are nonspecific but compatible with extensive chronic small vessel ischemic disease. There is moderate cerebral atrophy. No intracranial mass effect or sizable extra-axial fluid collection is identified. IMPRESSION: 1. Diffusion only examination. 2. No acute infarct. 3. Extensive chronic small vessel ischemic disease. Electronically Signed   By: Logan Bores M.D.   On: 05/12/2019 14:21   Ct Head Code Stroke Wo Contrast  Result Date: 05/12/2019 CLINICAL DATA:  Code stroke. Acute onset of right-sided weakness and slurred speech. Last seen well 30 minutes ago. Ataxia, stroke suspected. EXAM: CT HEAD WITHOUT CONTRAST TECHNIQUE: Contiguous axial images were obtained  from the base of the skull through the vertex without intravenous contrast. COMPARISON:  None. FINDINGS: Brain: Advanced atrophy and white matter disease is present. No acute cortical infarct is present. There is no acute hemorrhage or mass lesion. Basal ganglia are intact. Insular ribbon is normal. The ventricles are proportionate to the degree of atrophy. No significant extraaxial fluid collection is present. The brainstem and cerebellum are within normal limits. Vascular: Extensive atherosclerotic changes are present within the cavernous internal carotid arteries and at the dural margin of the left vertebral artery. There is no hyperdense vessel. Skull: Calvarium is intact. No focal lytic or blastic lesions are present. Sinuses/Orbits: The paranasal sinuses and mastoid air cells are clear. Bilateral lens replacements are noted. Globes and orbits are otherwise unremarkable. ASPECTS [facility] Stroke Program  Early CT Score) - Ganglionic level infarction (caudate, lentiform nuclei, internal capsule, insula, M1-M3 cortex): 7/7 - Supraganglionic infarction (M4-M6 cortex): 3/3 Total score (0-10 with 10 being normal): 10/10 IMPRESSION: 1. No acute intracranial abnormality. 2. Advanced atrophy and diffuse white matter disease likely reflects the sequela of chronic microvascular ischemia. 3. Atherosclerosis 4. ASPECTS is 10/10 The above was relayed via text pager to Dr. Rory Percy on 05/12/2019 at 11:11 . Electronically Signed   By: San Morelle M.D.   On: 05/12/2019 11:11     EKG: normal EKG, normal sinus rhythm, unchanged from previous tracings, RBBB, left axis deviation.   Physical Examination:   Temp:  [98.4 F (36.9 C)] 98.4 F (36.9 C) (11/05 1002) Pulse Rate:  [63-76] 76 (11/05 1419) Resp:  [14-20] 18 (11/05 1419) BP: (105-160)/(53-89) 160/83 (11/05 1419) SpO2:  [98 %-100 %] 100 % (11/05 1419)  General- Well nourished, well developed, in no apparent distress.  Ophthalmologic- fundi not visualized due to noncooperation.  Cardiovascular - Regular rhythm and rate, no.  Mental Status-  Level of arousal and orientation toself and age were intact, however, not orientated to time and place. Language including repetition, comprehension was assessed and found intact.Naming 2/3. Delay recall 0/3. Language incoherent with intermittent word salad, but fluent language.  Fund of Knowledge was assessed and wasimpaired.  Cranial Nerves II - XII- II - Visual field difficult to exam with pt not quite following instructions, however, pt seems to have right hemianopia. III, IV, VI - Extraocular movements intact. V - Facial sensation intact bilaterally. VII - Facial movement intact bilaterally. VIII - Hearing & vestibular intact bilaterally. X - Palate elevates symmetrically. XI - Chin turning & shoulder shrug intact bilaterally. XII - Tongue protrusion intact.  Motor Strength -The  patient's strength was normal in all extremities and pronator drift was absent. Bulk was normal and fasciculations were absent.  Motor Tone- Muscle tone was assessed at the neck and appendages and was normal.  Reflexes-The patient's reflexes were symmetrical in all extremities andshehad no pathological reflexes.  Sensory-Light touch, temperature/pinprick were assessed and were symmetrical.   Coordination-The patient had normal movements in the hands with no ataxia or dysmetria. However, difficulty following instructions.Tremor was absent.  Gait and Station-deferred.   Assessment:  [name] is a 85 y.o. female with history of atrial tachycardia but no atrial fibrillation, asymptomatic R ICA stenosis, cognitive deficits, depression, hypothyroidism, HTN, HLD, discharged yesterday 11/4 following symptoms of transient slurred speech, confusion, LLE weakness with full stroke workup where MRI was negative, now presenting with worsened confusion and LLE weakness with new R visual field defect.  She did not receive IV t-PA due to unknown LKW.   Stroke:  L occipital infarct due to chronic L PCA occlusion in the setting of hypotension. Given her overall picture of vasculopathy, this is consistent with large vessel disease source. However, afib can not be completely ruled out.   CT head 11/3 no acute abnormality  CTA head & neck 11/3 R ICA 65% stenosis. Moderate distal R M1 stenosis. Decreased perfusion R MCA. L PCA occlusion w/ collaterals. Proximal R P2 high-grade stenosis w/ collaterals. Aortic arch, L ICA bifurcation, VA origins, B cavernous ICA atherosclerosis.   MRI  11/3 No acute infarct. Extensive small vessel disease.   2D Echo 11/4  EF 65-70%. No source of embolus   CT head 11/5 new moderate L occipital infarct. L PCA occluded on recent CTA. No hemorrhage.   MRI  11/5  pending  Will also recommend 30 day cardiac event monitoring as outpt to rule out afib  LDL  174  HgbA1c 5.8   Lovenox 30 mg sq daily for VTE prophylaxis  aspirin 325 mg daily and clopidogrel 75 mg daily x 3 months then aspirin alone planned at d/c 11/5 and , now on aspirin 325 mg daily and clopidogrel 75 mg daily. Recommend continuation of DAPT x 3 mos then aspirin alone    Therapy recommendations:  pending   Disposition:  pending   Chronic left PCA occlusion  CTA 11/3 left PCA occlusion with collaterals, likely chronic  Current CT showed left PCA infarct with low BP on presentation  Avoid low BP  Permissive hypertension for 24-48 hours and then gradually to goal 130-150 in 3-5 days  Worsening dementia  Baseline dementia on aricept  Worsening memory and language coherence after current stroke  Likely vascular dementia on top of AD  Resume aricept once pass swallow  Carotid Stenosis, known, asymptomatic   Pt has been following with [facility]  Serial CUS showed right ICA 40-59% stenosis with last one done 09/2018  This admission CTA neck R ICA 65% stenosis.   Presenting symptoms not related to right carotid system  Still considered asymptomatic right ICA stenosis  Recommend to continue follow up with [facility] for further monitoring and management  Hypertension with intermittent hypotension  Stable . Permissive hypertension (OK if < 220/120) but gradually reach goal in 3-5 days . Long-term BP goal 130-150 given R ICA stenosis . Avoid low BP . Check orthostatic vitals in am with PT/OT  Hyperlipidemia  Home meds:  lipitor 80, resumed in hospital  LDL 174, goal < 70  Continue statin at discharge   Other Stroke Risk Factors  Advanced age  Overweight,  There is no height or weight on file to calculate BMI., recommend weight loss, diet and exercise as appropriate   Hx stroke/TIA   Other Active Problems  Atrial tachycardia on metoprolol 50 bid  Depression on lexapro  Hypothyroid, TSH 0.33 on synthroid - repeat TSH and FT4 in am  Hospital day # 0    Thank you for this consultation and allowing [place] to participate in the care of this patient. We will follow.  Rosalin Hawking, MD PhD Stroke Neurology 05/14/2019 4:53 PM    To contact Stroke Continuity provider, please refer to [email or web address] After hours, contact General Neurology

## 2019-05-14 NOTE — Evaluation (Signed)
Physical Therapy Evaluation Patient Details Name: Maria Clarke MRN: [id number] DOB: 1922-05-29 Today's Date: 05/14/2019   History of Present Illness  85 yo female with PMH of right carotid stenosis (60-79%), hypertension, hyperlipidemia, hypothyroidism, and dementia who presented for worsening confusion and LLE weakness found to have new R sided visual field deficit. CT Head notable for new moderate L occipital lobe infarction without evidence of hemorrhage.  PMH positive for Atrial tachycardia (Maria Clarke), Carotid artery disease (Maria Clarke), Depression, Hypercholesteremia, Hypertension, and Hypothyroidism.  Clinical Impression  Patient presents with mobility similar to yesterday except noticing L hand held up with ambulation and not able to see signs on R side.  Feel she will need capable 24 hour assist at d/c.  If sons unable to provide may need STSNF placement.  PT to follow.     Follow Up Recommendations Home health PT;Supervision/Assistance - 24 hour    Equipment Recommendations  Other (comment)(TBA)    Recommendations for Other Services       Precautions / Restrictions Precautions Precautions: Fall      Mobility  Bed Mobility Overal bed mobility: Needs Assistance Bed Mobility: Sit to Supine     Supine to sit: Supervision Sit to supine: Supervision   General bed mobility comments: for safety with lines and on stretcher in the ED  Transfers Overall transfer level: Needs assistance Equipment used: None Transfers: Sit to/from Stand Sit to Stand: Min guard         General transfer comment: assist for safety  Ambulation/Gait Ambulation/Gait assistance: Min guard Gait Distance (Feet): 200 Feet   Gait Pattern/deviations: Step-to pattern;Step-through pattern;Decreased stride length;Drifts right/left     General Gait Details: no LOB, stopping at my request to attempt to read hallway sign, but pt using initial letter and making up rest of word, then stopped to look at numbers on  door on R side,  Stairs            Wheelchair Mobility    Modified Rankin (Stroke Patients Only) Modified Rankin (Stroke Patients Only) Pre-Morbid Rankin Score: Moderate disability Modified Rankin: Moderately severe disability     Balance Overall balance assessment: Needs assistance   Sitting balance-[name] Scale: Good Sitting balance - Comments: sitting to doff shoes and pull up socks   Standing balance support: No upper extremity supported Standing balance-[name] Scale: Fair                               Pertinent Vitals/Pain Pain Assessment: No/denies pain    Home Living Family/patient expects to be discharged to:: Private residence Living Arrangements: Children Available Help at Discharge: Family;Available PRN/intermittently Type of Home: House Home Access: Level entry     Home Layout: One level   Additional Comments: not sure of home equipment    Prior Function        ADL's / Homemaking Assistance Needed: lives with sons, Maria Clarke has help for IADL's  Comments: sons work, sometimes she goes to gas station with them, but at times home alone     Hand Dominance        Extremity/Trunk Assessment   Upper Extremity Assessment LUE Deficits / Details: decreased coordination noted in LUE LUE Coordination: decreased fine motor    Lower Extremity Assessment Lower Extremity Assessment: Overall WFL for tasks assessed    Cervical / Trunk Assessment Cervical / Trunk Assessment: Kyphotic  Communication   Communication: No difficulties  Cognition Arousal/Alertness: Awake/alert Behavior During Therapy: [facility]  for tasks assessed/performed Overall Cognitive Status: No family/caregiver present to determine baseline cognitive functioning                                 General Comments: not sure why she is here or where she is, wanting to take a nap      General Comments General comments (skin integrity, edema, etc.): difficulty  finding sign on R in hallway for reading the numbers and never reported numbers until cued with direct stimulus    Exercises     Assessment/Plan    PT Assessment Patient needs continued PT services  PT Problem List Decreased strength;Decreased activity tolerance;Decreased mobility;Decreased cognition;Decreased balance;Decreased knowledge of use of DME;Decreased safety awareness       PT Treatment Interventions DME instruction;Therapeutic activities;Balance training;Patient/family education;Therapeutic exercise;Functional mobility training;Gait training;Neuromuscular re-education    PT Goals (Current goals can be found in the Care Plan section)  Acute Rehab PT Goals Patient Stated Goal: Return home to her cats PT Goal Formulation: With patient Time For Goal Achievement: 05/28/19 Potential to Achieve Goals: Fair    Frequency Min 4X/week   Barriers to discharge        Co-evaluation               AM-PAC PT "6 Clicks" Mobility  Outcome Measure Help needed turning from your back to your side while in a flat bed without using bedrails?: None Help needed moving from lying on your back to sitting on the side of a flat bed without using bedrails?: None Help needed moving to and from a bed to a chair (including a wheelchair)?: A Little Help needed standing up from a chair using your arms (e.g., wheelchair or bedside chair)?: A Little Help needed to walk in hospital room?: A Little Help needed climbing 3-5 steps with a railing? : A Little 6 Click Score: 20    End of Session   Activity Tolerance: Patient limited by fatigue Patient left: in bed;with call bell/phone within reach   PT Visit Diagnosis: Other abnormalities of gait and mobility (R26.89);Other symptoms and signs involving the nervous system (R29.898)    Time: [id number] PT Time Calculation (min) (ACUTE ONLY): 18 min   Charges:   PT Evaluation $PT Eval Low Complexity: Short, Maria Clarke Acute  Rehabilitation Services [phone] 05/14/2019   Reginia Naas 05/14/2019, 3:48 PM

## 2019-05-14 NOTE — ED Notes (Signed)
Pt's son at bedside

## 2019-05-14 NOTE — ED Provider Notes (Signed)
[place] EMERGENCY DEPARTMENT Provider Note   CSN: [id number] Arrival date & time: 05/14/19  0957     History   Chief Complaint Chief Complaint  Patient presents with   Stroke Symptoms    HPI Maria Clarke is a 85 y.o. female with PMH significant for CAD, carotid stenosis, depression, hypothyroidism, HTN, cognitive impairment who was recently admitted for TIA symptoms of transient slurred speech, confusion, LLE weakness. Patient presents today for worsened symptoms since discharge. Son reports compliance with ASA 325mg , plavix, statin, antihypertensives with highest SBP 160 at home. Patient denies CP, SOB, N/V, vision changes.    Past Medical History:  Diagnosis Date   Atrial tachycardia ([place])    a. Noted 02/2013 on EKG.   Carotid artery disease ([facility])    Carotid duplex showed [id number] RICA and [id number] LICA   Depression    Hypercholesteremia    Hypertension    Hypothyroidism     Patient Active Problem List   Diagnosis Date Noted   TIA (transient ischemic attack) 05/12/2019   Cognitive impairment 10/29/2018   Shingles outbreak 05/25/2015   Carotid stenosis 04/20/2014   Memory loss, short term 08/13/2013   Occlusion and stenosis of carotid artery without mention of cerebral infarction 04/20/2013   Carotid bruit 03/05/2013   Hearing loss of both ears 11/10/2012   EDEMA- LOCALIZED 12/15/2009   Hypothyroidism 12/11/2007   Depression, recurrent ([place]) 12/11/2007   Essential hypertension 12/11/2007   HEMATURIA 02/14/2007    Past Surgical History:  Procedure Laterality Date   BREAST SURGERY Left    Benign Tumor- several years ago   CESAREAN SECTION     EYE SURGERY Bilateral    cataract     OB History   No obstetric history on file.      Home Medications    Prior to Admission medications   Medication Sig Start Date End Date Taking? Authorizing Provider  amLODipine (NORVASC) 5 MG tablet Take 1 tablet (5 mg total) by mouth  daily. 12/23/18   Nafziger, Tommi Rumps, NP  aspirin 325 MG tablet Take 1 tablet (325 mg total) by mouth daily. 05/14/19   Mitzi Hansen, MD  atorvastatin (LIPITOR) 80 MG tablet Take 1 tablet (80 mg total) by mouth daily at 6 PM. 05/13/19   Mitzi Hansen, MD  clopidogrel (PLAVIX) 75 MG tablet Take 1 tablet (75 mg total) by mouth daily. 05/14/19 08/12/19  Mitzi Hansen, MD  cyanocobalamin (,VITAMIN B-12,) 1000 MCG/ML injection Inject 1 mL (1,000 mcg total) into the skin every 30 (thirty) days. 12/23/18   Nafziger, Tommi Rumps, NP  diphenhydramine-acetaminophen (TYLENOL PM) 25-500 MG TABS tablet Take 1 tablet by mouth at bedtime.    [provider]  donepezil (ARICEPT) 5 MG tablet Take 1 tablet (5 mg total) by mouth at bedtime. 04/18/18   Dorena Cookey, MD  escitalopram (LEXAPRO) 5 MG tablet Take 1 tablet (5 mg total) by mouth at bedtime. 12/23/18   Nafziger, Tommi Rumps, NP  levothyroxine (SYNTHROID) 100 MCG tablet Take 1 tablet (100 mcg total) by mouth daily before breakfast. 05/08/19   Nafziger, Tommi Rumps, NP  methylPREDNISolone (MEDROL DOSEPAK) 4 MG TBPK tablet Take as directed Patient taking differently: Take 4-24 mg by mouth See admin instructions. Take 24 mg by mouth on day one, then decrease by 4 mg daily until finished 05/08/19   Nafziger, Tommi Rumps, NP  metoprolol tartrate (LOPRESSOR) 50 MG tablet TAKE 1 TABLET BY MOUTH TWICE A DAY Patient taking differently: Take 50 mg by mouth 2 (two)  times daily. TAKE 1 TABLET BY MOUTH TWICE A DAY 12/23/18   Nafziger, Tommi Rumps, NP    Family History Family History  Problem Relation Age of Onset   Diabetes Mother    Heart disease Mother        After age 47   Hyperlipidemia Mother    Hypertension Mother    Heart disease Sister        After age 42   Asthma Sister    Hyperlipidemia Sister    Heart disease Brother        After age 78   Diabetes Son    Hypertension Son     Social History Social History   Tobacco Use   Smoking status: Never Smoker    Smokeless tobacco: Never Used  Substance Use Topics   Alcohol use: No   Drug use: No     Allergies   Sulfonamide derivatives   Review of Systems Review of Systems - per HPI   Physical Exam Updated Vital Signs BP (!) 134/59 (BP Location: Right Arm)    Pulse 70    Temp 98.4 F (36.9 C) (Oral)    Resp 16    SpO2 99%   Physical Exam Vitals signs reviewed.  Constitutional:      General: She is not in acute distress.    Appearance: She is not toxic-appearing.  HENT:     Mouth/Throat:     Mouth: Mucous membranes are moist.  Eyes:     General: Visual field deficit present.     Extraocular Movements: Extraocular movements intact.  Cardiovascular:     Rate and Rhythm: Normal rate and regular rhythm.     Comments: Systolic murmur Pulmonary:     Effort: Pulmonary effort is normal. No respiratory distress.     Breath sounds: Normal breath sounds. No wheezing, rhonchi or rales.  Abdominal:     General: Bowel sounds are normal.     Palpations: Abdomen is soft.     Tenderness: There is no abdominal tenderness. There is no guarding or rebound.  Musculoskeletal:     Right lower leg: No edema.     Left lower leg: No edema.  Neurological:     Mental Status: She is alert.     Sensory: Sensation is intact.     Coordination: Romberg sign negative. Finger-Nose-Finger Test abnormal.     Comments: Oriented to self only. R sided visual field deficit to midline. 5/5 UE strength bilaterally. 5/5 RLE, 4/5 LLE.     ED Treatments / Results  Labs (all labs ordered are listed, but only abnormal results are displayed) Labs Reviewed  COMPREHENSIVE METABOLIC PANEL - Abnormal; Notable for the following components:      Result Value   Potassium 3.4 (*)    Glucose, Bld 133 (*)    Creatinine, Ser 1.16 (*)    Total Protein 6.4 (*)    Albumin 3.4 (*)    GFR calc non Af Amer 40 (*)    GFR calc Af Amer 46 (*)    All other components within normal limits  I-STAT CHEM 8, ED - Abnormal; Notable  for the following components:   Potassium 3.4 (*)    BUN 26 (*)    Glucose, Bld 123 (*)    All other components within normal limits  PROTIME-INR  APTT  CBC  DIFFERENTIAL  CBG MONITORING, ED    EKG None  Radiology Ct Head Wo Contrast  Result Date: 05/14/2019 CLINICAL DATA:  Altered level  of consciousness EXAM: CT HEAD WITHOUT CONTRAST TECHNIQUE: Contiguous axial images were obtained from the base of the skull through the vertex without intravenous contrast. COMPARISON:  None. FINDINGS: Brain: New area of hypoattenuation with loss of gray-white differentiation in the left occipital lobe reflecting likely late acute infarction. There is no acute intracranial hemorrhage. Stable prominence of ventricles and sulci reflecting parenchymal volume loss. Patchy confluent areas of hypoattenuation supratentorial white matter nonspecific probably reflects stable advanced chronic microvascular ischemic changes. No extra-axial fluid collection. Vascular: Atherosclerotic calcification at the skull base. Skull: Calvarium is unremarkable. Sinuses/Orbits: Paranasal sinuses are aerated orbits are unremarkable. Other: Partial opacification of the right mastoid tip. 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 Of note, left PCA was occluded on recent CTA. No acute intracranial hemorrhage. Stable chronic findings detailed above. Electronically Signed   By: Macy Mis M.D.   On: 05/14/2019 10:56   Mr Brain Wo Contrast  Result Date: 05/12/2019 CLINICAL DATA:  Transient aphasia/slurred speech and right leg weakness. EXAM: MRI HEAD WITHOUT CONTRAST TECHNIQUE: Multiplanar, multiecho pulse sequences of the brain and surrounding structures were obtained without intravenous contrast. COMPARISON:  Head CT and CTA 05/12/2019 FINDINGS: The patient terminated the examination prior to completion. Only axial and coronal diffusion sequences were obtained. No acute or early subacute infarct is  evident. Confluent T2 hyperintensities in the cerebral white matter bilaterally are nonspecific but compatible with extensive chronic small vessel ischemic disease. There is moderate cerebral atrophy. No intracranial mass effect or sizable extra-axial fluid collection is identified. IMPRESSION: 1. Diffusion only examination. 2. No acute infarct. 3. Extensive chronic small vessel ischemic disease. Electronically Signed   By: Logan Bores M.D.   On: 05/12/2019 14:21    Procedures Procedures (including critical care time)  Medications Ordered in ED Medications  sodium chloride flush (NS) 0.9 % injection 3 mL (has no administration in time range)     Initial Impression / Assessment and Plan / ED Course  I have reviewed the triage vital signs and the nursing notes.  Pertinent labs & imaging results that were available during my care of the patient were reviewed by me and considered in my medical decision making (see chart for details).   84yo F w/ h/o CAD, carotid stenosis, depression, hypothyroidism, HTN, cognitive impairment presenting for worsening confusion and LLE weakness found to have new R sided visual field deficit. CT Head notable for new moderate L occipital lobe infarction without evidence of hemorrhage. Spoke with Dr. Erlinda Hong with Neurology who recommended admission for repeat stroke w/u given new findings. MRI brain pending. Spoke with IM resident and will admit as bounce back.    Final Clinical Impressions(s) / ED Diagnoses   Final diagnoses:  Cerebral infarction, unspecified mechanism [facility]    ED Discharge Orders    None       Rory Percy, DO 05/14/19 1244    Carmin Muskrat, MD 05/14/19 1418

## 2019-05-14 NOTE — ED Triage Notes (Signed)
Pt was diagnosed with stroke this Tuesday- slurred speech, confusion, left sided weakness. Pt's son brought her in today for her symptoms worsening. Pt's son feels she was discharged too early. Pt was discharged yesterday. No new symptoms- only worsening symptoms.

## 2019-05-14 NOTE — ED Notes (Addendum)
Physical therapy at bedside

## 2019-05-14 NOTE — ED Notes (Signed)
Pt confused and disoriented. Continues to get up from bed regardless of staff redirection. Safety sitter order placed.

## 2019-05-14 NOTE — H&P (Signed)
Date: 05/14/2019               Patient Name:  Maria Clarke MRN: [id number]  DOB: Aug 28, 1921 Age / Sex: 85 y.o., female   PCP: [name], NP         Medical Service: Internal Medicine Teaching Service         Attending Physician: Dr. Lucious Groves, DO    First Contact: Dr. Court Joy Pager: [phone]  Second Contact: Dr. Koleen Distance Pager: [phone]       After Hours (After 5p/  First Contact Pager: [phone]  weekends / holidays): Second Contact Pager: [phone]   Chief Complaint: Discharged yesterday for TIA, back for worsening memory and progressive worsening of her gait  History of Present Illness: Maria Clarke is a 85 year old female with PMH of HTN, HLD, hypothyroidism, dementia, and TIA ( discharged on 11/4) who presents with worsening memory and progressive worsening of gait noticed by her son. Patient's son is in the room and says his mothers memory was worsening over night. On discharge he was helping his mother ambulate, but today she could not ambulate even with help. Patient was on DAPT ( Plavix and Asprin 325). Son reports no falls and compliance with medications since discharge.   Meds:  Current Meds  Medication Sig  . amLODipine (NORVASC) 5 MG tablet Take 1 tablet (5 mg total) by mouth daily.  Marland Kitchen atorvastatin (LIPITOR) 80 MG tablet Take 1 tablet (80 mg total) by mouth daily at 6 PM.  . clopidogrel (PLAVIX) 75 MG tablet Take 1 tablet (75 mg total) by mouth daily.  . cyanocobalamin (,VITAMIN B-12,) 1000 MCG/ML injection Inject 1 mL (1,000 mcg total) into the skin every 30 (thirty) days.  . diphenhydramine-acetaminophen (TYLENOL PM) 25-500 MG TABS tablet Take 1 tablet by mouth at bedtime.  . donepezil (ARICEPT) 5 MG tablet Take 1 tablet (5 mg total) by mouth at bedtime.  Marland Kitchen escitalopram (LEXAPRO) 5 MG tablet Take 1 tablet (5 mg total) by mouth at bedtime.  Marland Kitchen levothyroxine (SYNTHROID) 100 MCG tablet Take 1 tablet (100 mcg total) by mouth daily before breakfast.  .  metoprolol tartrate (LOPRESSOR) 50 MG tablet TAKE 1 TABLET BY MOUTH TWICE A DAY (Patient taking differently: Take 50 mg by mouth 2 (two) times daily. TAKE 1 TABLET BY MOUTH TWICE A DAY)     Allergies: Allergies as of 05/14/2019 - Review Complete 05/14/2019  Allergen Reaction Noted  . Sulfonamide derivatives Other (See Comments) 02/14/2007   Past Medical History:  Diagnosis Date  . Atrial tachycardia ([place])    a. Noted 02/2013 on EKG.  . Carotid artery disease ([place])    Carotid duplex showed [id number] RICA and [id number] LICA  . Depression   . Hypercholesteremia   . Hypertension   . Hypothyroidism     Family History:  Family History  Problem Relation Age of Onset  . Diabetes Mother   . Heart disease Mother        After age 24  . Hyperlipidemia Mother   . Hypertension Mother   . Heart disease Sister        After age 35  . Asthma Sister   . Hyperlipidemia Sister   . Heart disease Brother        After age 43  . Diabetes Son   . Hypertension Son      Social History:  Social History   Tobacco Use  . Smoking status: Never Smoker  . Smokeless  tobacco: Never Used  Substance Use Topics  . Alcohol use: No  . Drug use: No     Review of Systems: A complete ROS was negative except as per HPI.   Physical Exam: Blood pressure (!) 160/83, pulse 76, temperature 98.4 F (36.9 C), temperature source Oral, resp. rate 18, SpO2 100 %.  Physical Exam Constitutional:      General: She is not in acute distress.    Appearance: Normal appearance.  HENT:     Head: Normocephalic and atraumatic.     Mouth/Throat:     Mouth: Mucous membranes are moist.     Pharynx: Oropharynx is clear.  Eyes:     General: No scleral icterus.    Extraocular Movements: Extraocular movements intact.     Pupils: Pupils are equal, round, and reactive to light.  Cardiovascular:     Rate and Rhythm: Normal rate and regular rhythm.     Pulses: Normal pulses.     Heart sounds: Normal heart sounds.  Pulmonary:      Breath sounds: Normal breath sounds. No wheezing, rhonchi or rales.  Abdominal:     General: Bowel sounds are normal. There is no distension.  Musculoskeletal:        General: No swelling or tenderness.  Neurological:     Comments: Mental Status: Patient is awake, alert, oriented to person, not to place, or time. Believes her son is her husband. No signs of aphasia. Right sided peripheral vision lost. Cranial Nerves: II: Pupils equal, round, and reactive to light.  III,IV, VI: EOMI without ptosis or diploplia.   V: Facial sensation is symmetric tolight touch andTemperature. VII: Facial movement is symmetric.  VIII: hearing is intact to voice X: Uvula elevates symmetrically XI: Shoulder shrug is symmetric. XII: tongue is midline without atrophy or fasciculations.  Motor: 5/5 bilateral UE, 5/5 bilateral lower extremitiy Sensory: Sensation is grossly intact to light touch in bilateral UEs & LEs Deep Tendon Reflexes symmetrical  Cerebellar: Finger-Nose and Heel-Shin are intact bilalat   Psychiatric:        Mood and Affect: Mood normal.        Behavior: Behavior normal.      EKG: personally reviewed my interpretation is 65 bpm, normal rhythm. Right bundle branch block. RBBB seen on previous EKG's.    Assessment & Plan by Problem: Active Problems:   CVA (cerebral vascular accident) ([place])  Annie Clarke is a 85 year old female with PMH of HTN, HLD, hypothyroidism, dementia, and TIA ( discharged on 11/4) who presents with CVA.  #CVA , ischemic infarct of left occipital lobe Left occipital lobe infarction found on CT Head. PCA was occluded on recent CTA and noted to have collateral flow. It is likely the collateral flow has occluded. Patient has difficulty following directions, right visual field defect, and worsening of her gait reported. Recent full workup by neurology on recent admission. On DAPT and high intensity statin.  - PT/OT - Consult Neurology  - Continue DAPT,  Asprin 325 and Clopidogrel 75 mg  - Allow permissive HTN for 48 hrs. No treatment for SBP unless over 220.  - - 1 to 1 sitter for dementia - MRI Brain - Continue 80 mg lipitor  #Dementia - continue aricept  #Depression - continue lexapro  #Hypothyrodism - Continue synthroid   Dispo: Admit patient to Observation with expected length of stay less than 2 midnights.  Signed:  Tamsen Snider, MD PGY1  [phone]

## 2019-05-14 NOTE — ED Notes (Addendum)
Pt continues to get up from bed and attempt to leave room. Pt in direct line of sight to nursing station. Redirecting pt working only momentarily.

## 2019-05-14 NOTE — Telephone Encounter (Signed)
Will monitor to see if patient goes to ED

## 2019-05-14 NOTE — ED Notes (Addendum)
Pt successfully PASSED stroke swallow screen

## 2019-05-14 NOTE — Telephone Encounter (Signed)
Pt's son calling, pt present. States pt in ED 11/3 with  slurred speech, admitted and discharged yesterday. "TIA".  Son states this AM pt has had several episodes of slurred speech, dizziness resulting in near fall, "I had to catch her." and difficulty following one step commands along with right sided neglect. States all this "Comes and goes." Unsure last time normal, "Maybe an hour ago."  Directed to ED now; states will follow disposition.  Reason for Disposition . Headache  (and neurologic deficit)    Intermittent slurred speech, dizziness, difficulty following commands  Answer Assessment - Initial Assessment Questions 1. SYMPTOM: "What is the main symptom you are concerned about?" (e.g., weakness, numbness)     Slurred speech, dizziness, can not follow one step commands 2. ONSET: "When did this start?" (minutes, hours, days; while sleeping)     Throughout morning 3. LAST NORMAL: "When was the last time you were normal (no symptoms)?"     Not sure, maybe hour ago 4. PATTERN "Does this come and go, or has it been constant since it started?"  "Is it present now?"     Several episodes this AM 5. CARDIAC SYMPTOMS: "Have you had any of the following symptoms: chest pain, difficulty breathing, palpitations?"      6. NEUROLOGIC SYMPTOMS: "Have you had any of the following symptoms: headache, dizziness, vision loss, double vision, changes in speech, unsteady on your feet?"    Dizzy, almost fell, slurred speech, right sided neglect 7. OTHER SYMPTOMS: "Do you have any other symptoms?"  Protocols used: NEUROLOGIC DEFICIT-A-AH

## 2019-05-15 ENCOUNTER — Observation Stay (HOSPITAL_COMMUNITY): Payer: Medicare Other

## 2019-05-15 DIAGNOSIS — F0391 Unspecified dementia with behavioral disturbance: Secondary | ICD-10-CM

## 2019-05-15 DIAGNOSIS — R4189 Other symptoms and signs involving cognitive functions and awareness: Secondary | ICD-10-CM | POA: Diagnosis present

## 2019-05-15 DIAGNOSIS — Z79890 Hormone replacement therapy (postmenopausal): Secondary | ICD-10-CM

## 2019-05-15 DIAGNOSIS — Z8249 Family history of ischemic heart disease and other diseases of the circulatory system: Secondary | ICD-10-CM | POA: Diagnosis not present

## 2019-05-15 DIAGNOSIS — I63532 Cerebral infarction due to unspecified occlusion or stenosis of left posterior cerebral artery: Secondary | ICD-10-CM | POA: Diagnosis present

## 2019-05-15 DIAGNOSIS — Z79899 Other long term (current) drug therapy: Secondary | ICD-10-CM | POA: Diagnosis not present

## 2019-05-15 DIAGNOSIS — F0151 Vascular dementia with behavioral disturbance: Secondary | ICD-10-CM

## 2019-05-15 DIAGNOSIS — I10 Essential (primary) hypertension: Secondary | ICD-10-CM | POA: Diagnosis present

## 2019-05-15 DIAGNOSIS — Z7902 Long term (current) use of antithrombotics/antiplatelets: Secondary | ICD-10-CM | POA: Diagnosis not present

## 2019-05-15 DIAGNOSIS — Z833 Family history of diabetes mellitus: Secondary | ICD-10-CM | POA: Diagnosis not present

## 2019-05-15 DIAGNOSIS — I2510 Atherosclerotic heart disease of native coronary artery without angina pectoris: Secondary | ICD-10-CM | POA: Diagnosis present

## 2019-05-15 DIAGNOSIS — F329 Major depressive disorder, single episode, unspecified: Secondary | ICD-10-CM

## 2019-05-15 DIAGNOSIS — Z66 Do not resuscitate: Secondary | ICD-10-CM | POA: Diagnosis present

## 2019-05-15 DIAGNOSIS — I639 Cerebral infarction, unspecified: Secondary | ICD-10-CM | POA: Diagnosis not present

## 2019-05-15 DIAGNOSIS — H538 Other visual disturbances: Secondary | ICD-10-CM | POA: Diagnosis present

## 2019-05-15 DIAGNOSIS — E039 Hypothyroidism, unspecified: Secondary | ICD-10-CM | POA: Diagnosis present

## 2019-05-15 DIAGNOSIS — F0390 Unspecified dementia without behavioral disturbance: Secondary | ICD-10-CM | POA: Diagnosis present

## 2019-05-15 DIAGNOSIS — I6529 Occlusion and stenosis of unspecified carotid artery: Secondary | ICD-10-CM

## 2019-05-15 DIAGNOSIS — E785 Hyperlipidemia, unspecified: Secondary | ICD-10-CM

## 2019-05-15 DIAGNOSIS — Z20828 Contact with and (suspected) exposure to other viral communicable diseases: Secondary | ICD-10-CM | POA: Diagnosis present

## 2019-05-15 DIAGNOSIS — I6521 Occlusion and stenosis of right carotid artery: Secondary | ICD-10-CM

## 2019-05-15 DIAGNOSIS — F05 Delirium due to known physiological condition: Secondary | ICD-10-CM | POA: Diagnosis present

## 2019-05-15 DIAGNOSIS — Z7982 Long term (current) use of aspirin: Secondary | ICD-10-CM | POA: Diagnosis not present

## 2019-05-15 DIAGNOSIS — R29703 NIHSS score 3: Secondary | ICD-10-CM | POA: Diagnosis present

## 2019-05-15 LAB — LIPID PANEL
Cholesterol: 191 mg/dL (ref 0–200)
HDL: 50 mg/dL (ref >40)
LDL Cholesterol: 122 mg/dL — ABNORMAL HIGH (ref 0–99)
Total CHOL/HDL Ratio: 3.8 RATIO
Triglycerides: 96 mg/dL (ref <150)
VLDL: 19 mg/dL (ref 0–40)

## 2019-05-15 LAB — BASIC METABOLIC PANEL
Anion gap: 10 (ref 5–15)
BUN: 17 mg/dL (ref 8–23)
CO2: 23 mmol/L (ref 22–32)
Calcium: 9 mg/dL (ref 8.9–10.3)
Chloride: 108 mmol/L (ref 98–111)
Creatinine, Ser: 0.88 mg/dL (ref 0.44–1.00)
GFR calc Af Amer: >60 mL/min (ref >60)
GFR calc non Af Amer: 56 mL/min — ABNORMAL LOW (ref >60)
Glucose, Bld: 107 mg/dL — ABNORMAL HIGH (ref 70–99)
Potassium: 3.2 mmol/L — ABNORMAL LOW (ref 3.5–5.1)
Sodium: 141 mmol/L (ref 135–145)

## 2019-05-15 LAB — T4, FREE: Free T4: 1.41 ng/dL — ABNORMAL HIGH (ref 0.61–1.12)

## 2019-05-15 LAB — HEMOGLOBIN A1C
Hgb A1c MFr Bld: 5.7 % — ABNORMAL HIGH (ref 4.8–5.6)
Mean Plasma Glucose: 116.89 mg/dL

## 2019-05-15 LAB — TSH: TSH: 0.671 u[IU]/mL (ref 0.350–4.500)

## 2019-05-15 MED ORDER — POTASSIUM CHLORIDE 20 MEQ PO PACK
40.0000 meq | PACK | Freq: Once | ORAL | Status: AC
  Administered 2019-05-15: 40 meq via ORAL
  Filled 2019-05-15: qty 2

## 2019-05-15 MED ORDER — LEVOTHYROXINE SODIUM 88 MCG PO TABS
88.0000 ug | ORAL_TABLET | Freq: Every day | ORAL | Status: DC
  Administered 2019-05-16 – 2019-05-18 (×3): 88 ug via ORAL
  Filled 2019-05-15 (×3): qty 1

## 2019-05-15 NOTE — Plan of Care (Signed)
Patient progressing towards plan of care goals. 

## 2019-05-15 NOTE — Progress Notes (Signed)
NAME:  Maria Clarke, MRN:  [id number], DOB:  17-Feb-1922, LOS: 0 ADMISSION DATE:  05/14/2019,  Primary: Maria Peng, NP   Brief History  85 yo female with htn, hyperlipidemia, carotid stenosis, hypothyroidism, and dementia who was discharged on day prior to admission for TIA (transient episode of slurred speech, confusion and gait issue) at which time she was discharged on DAPT. Full stroke workup performed on that admission was neg including MRI that negative for stroke findings. Presented with progressively worsening gait and mental status as reported by her son and was found to have a left occipital lobe infarct on imaging.   Subjective  Was aggitated and combative overnight. Received 2mg  ativan prior to MRI this morning. She was resting peacefully while in room this morning. Was arousable however kept drifting back to sleep.  Objective   Blood pressure (!) 165/73, pulse 95, temperature 97.7 F (36.5 C), temperature source Oral, resp. rate 19, SpO2 99 %.    No intake or output data in the 24 hours ending 05/15/19 1318 There were no vitals filed for this visit.  Examination: GENERAL: sleeping CARDIAC: heart RRR. No peripheral edema.  PULMONARY: acyanotic. Lung sounds clear to auscultation. ABDOMEN: bs active NEURO: awakens to voice but kept drifting back to sleep. Did follow command to open her eyes but no other commands. Unable to assess strength or motor function. SKIN: no rash or lesions on limited exam   Consults:  Neuro  Significant Diagnostic Tests:  11/5 CT head: left occipital lobe infarct 11/6: moderate size PCA territory infarct involving the occipital lobe without hemmorhage or mass effect.  Underlying temporal lobe cerebral atrophy with moderate chronic microvascular ischemic disease   Labs    CBC Latest Ref Rng & Units 05/14/2019 05/14/2019 05/12/2019  WBC 4.0 - 10.5 K/uL - 9.5 -  Hemoglobin 12.0 - 15.0 g/dL 14.3 14.3 13.9  Hematocrit 36.0 - 46.0 % 42.0 44.0 41.0   Platelets 150 - 400 K/uL - 244 -   BMP Latest Ref Rng & Units 05/15/2019 05/14/2019 05/14/2019  Glucose 70 - 99 mg/dL 107(H) 123(H) 133(H)  BUN 8 - 23 mg/dL 17 26(H) 23  Creatinine 0.44 - 1.00 mg/dL 0.88 1.00 1.16(H)  Sodium 135 - 145 mmol/L 141 143 140  Potassium 3.5 - 5.1 mmol/L 3.2(L) 3.4(L) 3.4(L)  Chloride 98 - 111 mmol/L 108 104 104  CO2 22 - 32 mmol/L 23 - 24  Calcium 8.9 - 10.3 mg/dL 9.0 - 9.3     Summary  85 yo female with htn, hyperlipidemia, carotid stenosis, and hypothyroidism admitted to the internal medicine teaching service for left occipital lobe CVA.   Assessment & Plan:  Active Problems:   Hypothyroidism   Essential hypertension   Carotid stenosis   CVA (cerebral vascular accident) ([place])  Left occipital lobe CVA. Prior CTA suggested occlusion of PCA with presence of collaterals. Most likely, collateral flow became occluding leading to most recent event. Plan Continue asprin/plavix/statin Continue allowance of permissive hypertension Continue PT/OT Neuro recommending 30d cardiac event monitoring outpatient to r/o afib  Hypothyroidism. TSH 0.67. T4 1.41. Plan: will decrease synthroid to 85mcg.  Dementia. Continue aricept. 1:1 sitter Depression: continue lexapro    Best practice:  CODE STATUS: DNR Diet: cardiac DVT for prophylaxis: lovenox Social considerations/Family communication: will call and update son today Dispo: lived independtly at home prior to admission. PT recommending home health PT with supervision/assistance 24h  Mitzi Hansen, MD INTERNAL MEDICINE RESIDENT PGY-1 PAGER #: [phone] 05/15/19 1:18 PM

## 2019-05-15 NOTE — Evaluation (Signed)
Occupational Therapy Evaluation Patient Details Name: Maria Clarke MRN: [id number] DOB: 07-25-1921 Today's Date: 05/15/2019    History of Present Illness 85 yo female with PMH of right carotid stenosis (60-79%), hypertension, hyperlipidemia, hypothyroidism, and dementia who presented for worsening confusion and LLE weakness found to have new R sided visual field deficit. CT Head notable for new moderate L occipital lobe infarction without evidence of hemorrhage.  PMH positive for Atrial tachycardia ([place]), Carotid artery disease ([place]), Depression, Hypercholesteremia, Hypertension, and Hypothyroidism.   Clinical Impression   Pt unable to offer information about PLOF, but appears to have been fairly independent in self care. Likely needed assist for IADL given her dementia. Pt received Ativan last night and sleepy upon arrival, but aroused easily and was cooperative. Overall, she needs min assist for ADL and ADL transfers and demonstrates R visual field cut and L fine motor incoordination with drift. Pt will need close supervision and family education to assist her in compensating for her field cut and insuring she remains safe. Will follow acutely.    Follow Up Recommendations  Home health OT;Supervision/Assistance - 24 hour    Equipment Recommendations       Recommendations for Other Services       Precautions / Restrictions Precautions Precautions: Fall Precaution Comments: R field cut Restrictions Weight Bearing Restrictions: No      Mobility Bed Mobility Overal bed mobility: Modified Independent                Transfers Overall transfer level: Needs assistance Equipment used: None Transfers: Sit to/from Stand;Stand Pivot Transfers Sit to Stand: Min guard Stand pivot transfers: Min guard            Balance Overall balance assessment: Needs assistance   Sitting balance-[name] Scale: Good       Standing balance-[name] Scale: Fair                              ADL either performed or assessed with clinical judgement   ADL Overall ADL's : Needs assistance/impaired Eating/Feeding: Minimal assistance;Sitting Eating/Feeding Details (indicate cue type and reason): maximal multimodal cues to locate drink in L visual field Grooming: Brushing hair;Minimal assistance;Sitting   Upper Body Bathing: Minimal assistance;Sitting   Lower Body Bathing: Sit to/from stand;Minimal assistance   Upper Body Dressing : Minimal assistance;Sitting   Lower Body Dressing: Set up;Min guard;Sit to/from stand Lower Body Dressing Details (indicate cue type and reason): donned socks without difficulty Toilet Transfer: Min guard;Stand-pivot   Toileting- Clothing Manipulation and Hygiene: Minimal assistance;Sit to/from stand               Vision Baseline Vision/History: Wears glasses Wears Glasses: At all times Patient Visual Report: No change from baseline Vision Assessment?: Yes Visual Fields: Right visual field deficit     Perception     Praxis      Pertinent Vitals/Pain Pain Assessment: No/denies pain     Hand Dominance Right   Extremity/Trunk Assessment Upper Extremity Assessment Upper Extremity Assessment: LUE deficits/detail LUE Coordination: decreased fine motor   Lower Extremity Assessment Lower Extremity Assessment: Defer to PT evaluation   Cervical / Trunk Assessment Cervical / Trunk Assessment: Kyphotic   Communication Communication Communication: No difficulties   Cognition Arousal/Alertness: Awake/alert Behavior During Therapy: WFL for tasks assessed/performed Overall Cognitive Status: Impaired/Different from baseline Area of Impairment: Orientation;Memory;Following commands;Safety/judgement;Awareness;Problem solving  Orientation Level: Disoriented to;Place;Time;Situation   Memory: Decreased short-term memory Following Commands: Follows one step commands with increased time Safety/Judgement:  Decreased awareness of safety;Decreased awareness of deficits Awareness: Intellectual Problem Solving: Slow processing;Decreased initiation;Difficulty sequencing;Requires verbal cues;Requires tactile cues     General Comments       Exercises     Shoulder Instructions      Home Living Family/patient expects to be discharged to:: Private residence Living Arrangements: Children Available Help at Discharge: Family;Available PRN/intermittently Type of Home: House Home Access: Level entry     Home Layout: One level     Bathroom Shower/Tub: Teacher, early years/pre: Standard                Prior Functioning/Environment Level of Independence: Needs assistance    ADL's / Homemaking Assistance Needed: lives with sons, [name] has help for IADL's            OT Problem List: Impaired vision/perception;Decreased coordination;Decreased cognition;Decreased safety awareness      OT Treatment/Interventions: Self-care/ADL training;Therapeutic exercise;Neuromuscular education;DME and/or AE instruction;Therapeutic activities;Cognitive remediation/compensation;Patient/family education;Balance training;Energy conservation    OT Goals(Current goals can be found in the care plan section) Acute Rehab OT Goals Patient Stated Goal: eat lunch OT Goal Formulation: Patient unable to participate in goal setting Time For Goal Achievement: 05/29/19 Potential to Achieve Goals: Good  OT Frequency: Min 3X/week   Barriers to D/C:            Co-evaluation              AM-PAC OT "6 Clicks" Daily Activity     Outcome Measure Help from another person eating meals?: A Little Help from another person taking care of personal grooming?: A Little Help from another person toileting, which includes using toliet, bedpan, or urinal?: A Little Help from another person bathing (including washing, rinsing, drying)?: A Little Help from another person to put on and taking off regular upper  body clothing?: A Little Help from another person to put on and taking off regular lower body clothing?: A Little 6 Click Score: 18   End of Session Nurse Communication: Mobility status  Activity Tolerance: Patient tolerated treatment well Patient left: in chair;with call bell/phone within reach;with nursing/sitter in room(sitter)  OT Visit Diagnosis: Muscle weakness (generalized) (M62.81);Unsteadiness on feet (R26.81);Other symptoms and signs involving cognitive function                Time: 1259-1320 OT Time Calculation (min): 21 min Charges:  OT General Charges $OT Visit: 1 Visit OT Evaluation $OT Eval Moderate Complexity: 1 Mod  Nestor Lewandowsky, OTR/L Acute Rehabilitation Services Pager: [phone] Office: [phone]  Maria Clarke 05/15/2019, 1:52 PM

## 2019-05-15 NOTE — Progress Notes (Addendum)
Physical Therapy Treatment Patient Details Name: Maria Clarke MRN: [id number] DOB: 02/07/1922 Today's Date: 05/15/2019    History of Present Illness 85 yo female with PMH of right carotid stenosis (60-79%), hypertension, hyperlipidemia, hypothyroidism, and dementia who presented for worsening confusion and LLE weakness found to have new R sided visual field deficit. CT Head notable for new moderate L occipital lobe infarction without evidence of hemorrhage.  PMH positive for Atrial tachycardia (Maria Clarke), Carotid artery disease (Maria Clarke), Depression, Hypercholesteremia, Hypertension, and Hypothyroidism.    PT Comments    Pt agreeable to PT this am, however pt is quite drowsy upon PT arrival and during PT session. Pt lacking safety awareness with ambulation and mobility as a whole today, at 2 points during ambulation pt tried to sit down when a chair was not behind her. Pt required max verbal and tactile cuing for safety today. RN suspects behavior is due to medication as pt was given ativan this am prior to MRI. PT updated recommendation to reflect HHPT, vs SNF, pending pt progress with safety and mobility.    Follow Up Recommendations  Home health PT;Supervision/Assistance - 24 hour;SNF     Equipment Recommendations  None recommended by PT    Recommendations for Other Services       Precautions / Restrictions Precautions Precautions: Fall Precaution Comments: R field cut Restrictions Weight Bearing Restrictions: No    Mobility  Bed Mobility Overal bed mobility: Needs Assistance Bed Mobility: Supine to Sit;Sit to Supine     Supine to sit: Supervision Sit to supine: Min assist   General bed mobility comments: supervision for safety for supine to sit, increased time with use of bedrails. Min assist for sit to supine for LE lifting, scooting pt up in bed with use of bed pads.  Transfers Overall transfer level: Needs assistance Equipment used: 1 person hand held assist Transfers: Sit  to/from Stand Sit to Stand: Min assist Stand pivot transfers: Min guard       General transfer comment: Min assist for steadying upon rising.  Ambulation/Gait Ambulation/Gait assistance: Min assist Gait Distance (Feet): 40 Feet(15 ft to bathroom +sink, 40 hallway, 40 hallway) Assistive device: Rolling walker (2 wheeled);1 person hand held assist Gait Pattern/deviations: Step-through pattern;Decreased stride length;Shuffle;Trunk flexed;Drifts right/left Gait velocity: decr   General Gait Details: Min assist for steadying, guiding pt to bathroom on R (pt with R field cut, difficulty with moving toward R), supporting pt in hallway as pt attempting to sit down without chair behind her. RN brought chair to PT and pt aid, pt with x1 minute seated rest break. Verbal cuing for upright posture, maintaining standing during hallway ambulation.   Stairs             Wheelchair Mobility    Modified Rankin (Stroke Patients Only)       Balance Overall balance assessment: Needs assistance Sitting-balance support: No upper extremity supported;Feet supported Sitting balance-[name] Scale: Good     Standing balance support: Single extremity supported Standing balance-[name] Scale: Poor Standing balance comment: unsteady this session with attempted to sit in hallway without chair present                            Cognition Arousal/Alertness: Lethargic;Suspect due to medications(pt received ativan last night, per RN pt still lethargic) Behavior During Therapy: [facility] for tasks assessed/performed Overall Cognitive Status: Impaired/Different from baseline Area of Impairment: Orientation;Memory;Following commands;Safety/judgement;Awareness;Problem solving;Attention  Orientation Level: Disoriented to;Place;Time;Situation Current Attention Level: Focused Memory: Decreased short-term memory Following Commands: Follows one step commands with increased time;Follows  one step commands inconsistently Safety/Judgement: Decreased awareness of safety;Decreased awareness of deficits Awareness: Intellectual Problem Solving: Slow processing;Decreased initiation;Difficulty sequencing;Requires verbal cues;Requires tactile cues General Comments: pt very drowsy, sleeping upon PT arrival. Pt with difficulty sequencing tasks, including toileting (placed soiled toilet tissue on the floor beside her), hand washing, and transferring/ambulating.      Exercises      General Comments General comments (skin integrity, edema, etc.): Continued difficulty with items/doors in R vision, requires multimodal cuing to orient to R.      Pertinent Vitals/Pain Pain Assessment: No/denies pain    Home Living Family/patient expects to be discharged to:: Private residence Living Arrangements: Children Available Help at Discharge: Family;Available PRN/intermittently Type of Home: House Home Access: Level entry   Home Layout: One level        Prior Function Level of Independence: Needs assistance    ADL's / Homemaking Assistance Needed: lives with sons, Maria Clarke has help for IADL's     PT Goals (current goals can now be found in the care plan section) Acute Rehab PT Goals Patient Stated Goal: Return home to her cats PT Goal Formulation: With patient Time For Goal Achievement: 05/28/19 Potential to Achieve Goals: Fair Progress towards PT goals: Progressing toward goals    Frequency    Min 4X/week      PT Plan Discharge plan needs to be updated    Co-evaluation              AM-PAC PT "6 Clicks" Mobility   Outcome Measure  Help needed turning from your back to your side while in a flat bed without using bedrails?: None Help needed moving from lying on your back to sitting on the side of a flat bed without using bedrails?: None Help needed moving to and from a bed to a chair (including a wheelchair)?: A Little Help needed standing up from a chair using your  arms (e.g., wheelchair or bedside chair)?: A Little Help needed to walk in hospital room?: A Lot Help needed climbing 3-5 steps with a railing? : A Lot 6 Click Score: 18    End of Session Equipment Utilized During Treatment: Gait belt Activity Tolerance: Patient tolerated treatment well Patient left: with call bell/phone within reach;in bed;with nursing/sitter in room;Other (comment)(with MDs in room, rounding) Nurse Communication: Mobility status PT Visit Diagnosis: Other abnormalities of gait and mobility (R26.89)     Time: [id number] PT Time Calculation (min) (ACUTE ONLY): 28 min  Charges:  $Gait Training: 8-22 mins $Therapeutic Activity: 8-22 mins                    Iyannah Blake E, PT Acute Rehabilitation Services Pager [phone]  Office [phone]    Cuyler Vandyken D Elonda Husky 05/15/2019, 3:48 PM

## 2019-05-15 NOTE — Progress Notes (Signed)
STROKE TEAM PROGRESS NOTE   INTERVAL HISTORY Sitter and speech therapy is at bedside.  Patient had sundowning last night and received Ativan around midnight.  During rounding, patient sleepy drowsy, still has right hemianopia, otherwise neuro stable.  Vitals:   05/15/19 0325 05/15/19 0818 05/15/19 1200 05/15/19 1521  BP: (!) 156/70 (!) 117/51 (!) 165/73 (!) 129/56  Pulse: 76 75 95 92  Resp: 16 18 19    Temp: 98.2 F (36.8 C) 98.1 F (36.7 C) 97.7 F (36.5 C) 97.6 F (36.4 C)  TempSrc: Axillary Oral Oral Oral  SpO2:  97% 99% 100%    CBC:  Recent Labs  Lab 05/12/19 1054  05/14/19 1013 05/14/19 1033  WBC 9.5  --  9.5  --   NEUTROABS 6.9  --  6.9  --   HGB 13.8   < > 14.3 14.3  HCT 42.4   < > 44.0 42.0  MCV 93.6  --  92.8  --   PLT 237  --  244  --    < > = values in this interval not displayed.    Basic Metabolic Panel:  Recent Labs  Lab 05/14/19 1013 05/14/19 1033 05/15/19 0245  NA 140 143 141  K 3.4* 3.4* 3.2*  CL 104 104 108  CO2 24  --  23  GLUCOSE 133* 123* 107*  BUN 23 26* 17  CREATININE 1.16* 1.00 0.88  CALCIUM 9.3  --  9.0   Lipid Panel:     Component Value Date/Time   CHOL 191 05/15/2019 0245   TRIG 96 05/15/2019 0245   HDL 50 05/15/2019 0245   CHOLHDL 3.8 05/15/2019 0245   VLDL 19 05/15/2019 0245   LDLCALC 122 (H) 05/15/2019 0245   HgbA1c:  Lab Results  Component Value Date   HGBA1C 5.7 (H) 05/15/2019   Urine Drug Screen:     Component Value Date/Time   LABOPIA NONE DETECTED 05/12/2019 1243   COCAINSCRNUR NONE DETECTED 05/12/2019 1243   LABBENZ NONE DETECTED 05/12/2019 1243   AMPHETMU NONE DETECTED 05/12/2019 1243   THCU NONE DETECTED 05/12/2019 1243   LABBARB NONE DETECTED 05/12/2019 1243    Alcohol Level     Component Value Date/Time   ETH <10 05/12/2019 1054    IMAGING Ct Head Wo Contrast  Result Date: 05/14/2019 CLINICAL DATA:  Altered level of consciousness EXAM: CT HEAD WITHOUT CONTRAST TECHNIQUE: Contiguous axial images  were obtained from the base of the skull through the vertex without intravenous contrast. COMPARISON:  None. FINDINGS: Brain: New area of hypoattenuation with loss of gray-white differentiation in the left occipital lobe reflecting likely late acute infarction. There is no acute intracranial hemorrhage. Stable prominence of ventricles and sulci reflecting parenchymal volume loss. Patchy confluent areas of hypoattenuation supratentorial white matter nonspecific probably reflects stable advanced chronic microvascular ischemic changes. No extra-axial fluid collection. Vascular: Atherosclerotic calcification at the skull base. Skull: Calvarium is unremarkable. Sinuses/Orbits: Paranasal sinuses are aerated orbits are unremarkable. Other: Partial opacification of the right mastoid tip. 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 Of note, left PCA was occluded on recent CTA. No acute intracranial hemorrhage. Stable chronic findings detailed above. Electronically Signed   By: Macy Mis M.D.   On: 05/14/2019 10:56   Mr Brain Wo Contrast  Result Date: 05/15/2019 CLINICAL DATA:  Initial evaluation for acute stroke. EXAM: MRI HEAD WITHOUT CONTRAST TECHNIQUE: Multiplanar, multiecho pulse sequences of the brain and surrounding structures were obtained without intravenous contrast. COMPARISON:  Recent MRI from 05/12/2019 as well as CT from 05/14/2019. FINDINGS: Brain: Generalized age-related cerebral atrophy, most pronounced within the anterior temporal lobes bilaterally. Patchy and confluent T2/FLAIR hyperintensity within the periventricular and deep white matter both cerebral hemispheres most consistent with chronic small vessel ischemic disease, moderate in nature. Mild patchy involvement of the pons. Moderate size confluent area of restricted diffusion involving the parasagittal left occipital lobe and mesial left temporal lobe, consistent with acute left PCA territory infarct.  Involvement of the left hippocampal formation, with mild patchy involvement of the left thalamus. No associated hemorrhage or significant mass effect. No other areas of acute or subacute infarction. Gray-white matter differentiation otherwise maintained. No acute intracranial hemorrhage. Few scattered chronic micro hemorrhages noted within the left cerebellum and left frontotemporal region, likely hypertensive/small vessel related. No mass lesion, midline shift or mass effect no hydrocephalus. No extra-axial fluid collection. Pituitary gland normal. Vascular: Major intracranial vascular flow voids are maintained. Skull and upper cervical spine: Craniocervical junction normal. Bone marrow signal intensity within normal limits. No scalp soft tissue abnormality. Sinuses/Orbits: Patient status post bilateral ocular lens replacement. Paranasal sinuses are clear. Small right mastoid effusion noted, of doubtful significance. Visualized nasopharynx unremarkable. Other: None. IMPRESSION: 1. Moderate-sized late subacute appearing left PCA territory infarct as above. No associated hemorrhage or mass effect. 2. Underlying temporal lobe predominant cerebral atrophy with moderate chronic microvascular ischemic disease. Electronically Signed   By: Jeannine Boga M.D.   On: 05/15/2019 02:06    PHYSICAL EXAM    Blood pressure (!) 129/56, pulse 92, temperature 97.6 F (36.4 C), temperature source Oral, resp. rate 19, SpO2 100 %.   General - Well nourished, well developed, in no apparent distress.  Ophthalmologic - fundi not visualized due to noncooperation.  Cardiovascular - Regular rhythm and rate.  Mental Status -  Drowsy sleepy, but easily arousable, however, not orientated to place and time. Language including expression, repetition, comprehension was assessed and found intact. Naming 2/3. Delay recall 0/3. Language incoherent with intermittent word salad, but fluent language. Fund of Knowledge was  assessed and was impaired.  Cranial Nerves II - XII - II - right hemianopia. III, IV, VI - Extraocular movements intact. V - Facial sensation intact bilaterally. VII - Facial movement intact bilaterally. VIII - Hearing & vestibular intact bilaterally. X - Palate elevates symmetrically. XI - Chin turning & shoulder shrug intact bilaterally. XII - Tongue protrusion intact.  Motor Strength - The patient's strength was normal in all extremities and pronator drift was absent.  Bulk was normal and fasciculations were absent.   Motor Tone - Muscle tone was assessed at the neck and appendages and was normal.  Reflexes - The patient's reflexes were symmetrical in all extremities and she had no pathological reflexes.  Sensory - Light touch, temperature/pinprick were assessed and were symmetrical.    Coordination - not cooperative, difficulty following instructions. Tremor was absent.  Gait and Station - deferred.  ASSESSMENT/PLAN Maria Clarke is a 85 y.o. female with history of atrial tachycardia but no atrial fibrillation, asymptomatic R ICA stenosis, cognitive deficits, depression, hypothyroidism, HTN, HLD, discharged yesterday 11/4 following symptoms of transient slurred speech, confusion, LLE weakness with full stroke workup where MRI was negative, now presenting with worsened confusion and LLE weakness with new R visual field defect.  She did not receive IV t-PA due to unknown LKW.   Stroke:   L PCA and thalamic infarcts due to chronic L PCA occlusion in the setting of hypotension.  Given her overall picture of vasculopathy, this is consistent with large vessel disease source. However, afib can not be completely ruled out.   CT head 11/3 no acute abnormality  CTA head & neck11/3 R ICA 65% stenosis. Moderate distal R M1 stenosis. Decreased perfusion R MCA. L PCA occlusion w/ collaterals. Proximal R P2high-grade stenosis w/ collaterals. Aortic arch, L ICA bifurcation, VA origins,  B cavernous ICA atherosclerosis.   MRI11/3 No acute infarct.Extensive small vessel disease.  2D Echo 11/4  EF 65-70%. No source of embolus   CT head 11/5 new moderate L occipital infarct. L PCA occluded on recent CTA. No hemorrhage.   MRI 11/6 left PCA moderate large and punctate left thalamic infarcts.   Recommend 30 day cardiac event monitoring as outpt to rule out afib  LDL 174  HgbA1c 5.8   Lovenox 30 mg sq daily for VTE prophylaxis  aspirin 325 mg daily and clopidogrel 75 mg daily x 3 months then aspirin alone planned at d/c 11/5 and , now on aspirin 325 mg daily and clopidogrel 75 mg daily. Recommend continuation of DAPT x 3 mos then aspirin alone    Therapy recommendations:  pending   Disposition:  pending   Chronic left PCA occlusion  CTA 11/3 left PCA occlusion with collaterals, likely chronic  Current CT showed left PCA infarct with low BP on presentation  Avoid low BP  Permissive hypertension for 24-48 hours and then gradually to goal 130-150 in 3-5 days  Worsening dementia  Baseline dementia on aricept  Worsening memory and language coherence after current stroke  Likely vascular dementia on top of AD  On aricept   Sundowning overnight s/p ativan   Sitter at bedside  May consider seroquel Qhs for sundowning  Carotid Stenosis, known, asymptomatic   Pt has been following with [facility]  Serial CUS showed right ICA 40-59% stenosis with last one done 09/2018  This admissionCTA neckR ICA 65% stenosis.  Presenting symptoms not related to right carotid system  Still considered asymptomatic right ICA stenosis  Recommend to continue follow up with [facility] for further monitoring and management  Hypertension with intermittent hypotension  Stable  Permissive hypertension (OK if < 220/120) but gradually reach goal in 3-5 days  Long-term BP goal 130-150 given R ICA stenosis  Avoid low BP  orthostatic vitals no orthostatic hypotension (lying  196/77, sitting 203/69, standing 207/72)  Hyperlipidemia  Home meds:  lipitor 80, resumed in hospital  LDL 174, goal < 70  Continue statin at discharge   Other Stroke Risk Factors  Advanced age  Other Active Problems  Atrial tachycardia on metoprolol 50 bid  Depression on lexapro  Hypothyroid, TSH 0.33 on synthroid - repeat TSH WNL and FT4 high at 1.41 - management as per primary team  Hospital day # 0  Neurology will sign off. Please call with questions. Pt will follow up with stroke clinic NP at [facility] in about 4 weeks. Thanks for the consult.   Rosalin Hawking, MD PhD Stroke Neurology 05/15/2019 4:09 PM   To contact Stroke Continuity provider, please refer to [email or web address] After hours, contact General Neurology

## 2019-05-15 NOTE — Evaluation (Signed)
Speech Language Pathology Evaluation Patient Details Name: Maria Clarke MRN: [id number] DOB: 1921-12-18 Today's Date: 05/15/2019 Time: 1245-1300 SLP Time Calculation (min) (ACUTE ONLY): 15 min  Problem List:  Patient Active Problem List   Diagnosis Date Noted  . CVA (cerebral vascular accident) ([place]) 05/14/2019  . TIA (transient ischemic attack) 05/12/2019  . Cognitive impairment 10/29/2018  . Shingles outbreak 05/25/2015  . Carotid stenosis 04/20/2014  . Memory loss, short term 08/13/2013  . Occlusion and stenosis of carotid artery without mention of cerebral infarction 04/20/2013  . Carotid bruit 03/05/2013  . Hearing loss of both ears 11/10/2012  . EDEMA- LOCALIZED 12/15/2009  . Hypothyroidism 12/11/2007  . Depression, recurrent ([place]) 12/11/2007  . Essential hypertension 12/11/2007  . HEMATURIA 02/14/2007   Past Medical History:  Past Medical History:  Diagnosis Date  . Atrial tachycardia ([place])    a. Noted 02/2013 on EKG.  . Carotid artery disease ([place])    Carotid duplex showed [id number] RICA and [id number] LICA  . Depression   . Hypercholesteremia   . Hypertension   . Hypothyroidism    Past Surgical History:  Past Surgical History:  Procedure Laterality Date  . BREAST SURGERY Left    Benign Tumor- several years ago  . CESAREAN SECTION    . EYE SURGERY Bilateral    cataract   HPI:  85 yo female with PMH of right carotid stenosis (60-79%), hypertension, hyperlipidemia, hypothyroidism, and dementia who presented for worsening confusion and LLE weakness found to have new R sided visual field deficit. CT Head notable for new moderate L occipital lobe infarction without evidence of hemorrhage.  PMH positive for Atrial tachycardia ([place]), Carotid artery disease ([place]), Depression, Hypercholesteremia, Hypertension, and Hypothyroidism.   Assessment / Plan / Recommendation Clinical Impression  Pt presents with cognitive-communication deficits marked by inattention, impaired naming  to confrontation, difficulty following commands, decreased orientation and insight.  Pt sleepy - given Ativan last night - but participatory.  Speech was clear and fluent, pt engaged with appropriate pragmatics.  Baseline cognition unknown, however with documented dementia.  Pt will benefit from acute SLP f/u, further diagnostic assessment and family education.  Will need 24 hour supervision.      SLP Assessment  SLP Recommendation/Assessment: Patient needs continued Speech Lanaguage Pathology Services SLP Visit Diagnosis: Cognitive communication deficit (R41.841)    Follow Up Recommendations  24 hour supervision/assistance    Frequency and Duration min 1 x/week  2 weeks      SLP Evaluation Cognition  Overall Cognitive Status: Impaired/Different from baseline Arousal/Alertness: Lethargic Orientation Level: Oriented to person Attention: Sustained Sustained Attention: Impaired Sustained Attention Impairment: Verbal basic Memory: Impaired Memory Impairment: Storage deficit;Retrieval deficit Awareness: Impaired Awareness Impairment: Intellectual impairment Problem Solving: Impaired Problem Solving Impairment: Verbal basic Safety/Judgment: Impaired       Comprehension  Auditory Comprehension Overall Auditory Comprehension: Impaired Yes/No Questions: Within Functional Limits Commands: Impaired Two Step Basic Commands: 50-74% accurate Conversation: Simple    Expression Expression Primary Mode of Expression: Verbal Verbal Expression Overall Verbal Expression: Impaired Initiation: No impairment Level of Generative/Spontaneous Verbalization: Sentence Repetition: No impairment Naming: Impairment Confrontation: Impaired Convergent: 50-74% accurate Verbal Errors: Semantic paraphasias Pragmatics: No impairment Written Expression Dominant Hand: Right   Oral / Motor  Oral Motor/Sensory Function Overall Oral Motor/Sensory Function: Within functional limits Motor Speech Overall  Motor Speech: Appears within functional limits for tasks assessed   GO  Juan Quam Laurice 05/15/2019, 4:09 PM  Estill Bamberg L. Tivis Ringer, [place] Office number [phone] Pager [phone]

## 2019-05-15 NOTE — Progress Notes (Signed)
Got a self inflicted skin tear to right lateral wrist.  Became very upset with staff and started swearing and pulling at her own skin and caused a skin tear.  Maria Clarke it is her skin and she will do with it what she pleases.  Cleaned and dressing applied.  Sitter was at her side and was assisting this nurse to calm her down.  She kept attempting to get up out of the bed.

## 2019-05-16 LAB — BASIC METABOLIC PANEL
Anion gap: 9 (ref 5–15)
BUN: 15 mg/dL (ref 8–23)
CO2: 24 mmol/L (ref 22–32)
Calcium: 9.1 mg/dL (ref 8.9–10.3)
Chloride: 107 mmol/L (ref 98–111)
Creatinine, Ser: 0.94 mg/dL (ref 0.44–1.00)
GFR calc Af Amer: 60 mL/min — ABNORMAL LOW (ref >60)
GFR calc non Af Amer: 52 mL/min — ABNORMAL LOW (ref >60)
Glucose, Bld: 104 mg/dL — ABNORMAL HIGH (ref 70–99)
Potassium: 3.8 mmol/L (ref 3.5–5.1)
Sodium: 140 mmol/L (ref 135–145)

## 2019-05-16 MED ORDER — POTASSIUM CHLORIDE 20 MEQ PO PACK
40.0000 meq | PACK | ORAL | Status: DC
  Filled 2019-05-16: qty 2

## 2019-05-16 NOTE — Progress Notes (Addendum)
   NAME:  Maria Clarke, MRN:  [id number], DOB:  [birth year], LOS: 1 ADMISSION DATE:  05/14/2019,  Primary: [name], NP   Brief History  85 yo female with htn, hyperlipidemia, carotid stenosis, hypothyroidism, and dementia who was discharged on day prior to admission for TIA (transient episode of slurred speech, confusion and gait issue) at which time she was discharged on DAPT. Full stroke workup performed on that admission was neg including MRI that negative for stroke findings. Presented with progressively worsening gait and mental status as reported by her son and was found to have a left occipital lobe infarct on imaging.   Subjective  Sitting up in chair having breakfast this morning. No overnight events  Objective   Blood pressure (!) 130/53, pulse 81, temperature 97.8 F (36.6 C), temperature source Oral, resp. rate 18, SpO2 97 %.    Examination: GENERAL: sitting up in chair eating CARDIAC: RRR PULMONARY: lung sounds clear ABDOMEN: bs active NEURO: alert. Oriented to person and place. Bilateral vision appears intact. Able to identify number of fingers I was holding up from about 43ft while covering one eye at a time. Peripheral vision appears intact. EOMs intact. Face symmetric. 5/5 strength of bilateral upper and lower extremities.  SKIN: no rash or lesions on limited exam   Consults:  Neuro  Significant Diagnostic Tests:  11/5 CT head: left occipital lobe infarct 11/6: moderate size PCA territory infarct involving the occipital lobe without hemmorhage or mass effect.  Underlying temporal lobe cerebral atrophy with moderate chronic microvascular ischemic disease   Labs    BMP Latest Ref Rng & Units 05/16/2019 05/15/2019 05/14/2019  Glucose 70 - 99 mg/dL 104(H) 107(H) 123(H)  BUN 8 - 23 mg/dL 15 17 26(H)  Creatinine 0.44 - 1.00 mg/dL 0.94 0.88 1.00  Sodium 135 - 145 mmol/L 140 141 143  Potassium 3.5 - 5.1 mmol/L 3.8 3.2(L) 3.4(L)  Chloride 98 - 111 mmol/L 107 108 104   CO2 22 - 32 mmol/L 24 23 -  Calcium 8.9 - 10.3 mg/dL 9.1 9.0 -     Summary  85 yo female with htn, hyperlipidemia, carotid stenosis, and hypothyroidism admitted to the internal medicine teaching service for left occipital lobe CVA.   Assessment & Plan:  Active Problems:   Hypothyroidism   Essential hypertension   Carotid stenosis   CVA (cerebral vascular accident) ([place])  Left occipital lobe CVA. Prior CTA suggested occlusion of PCA with presence of collaterals. Most likely, collateral flow became occluding leading to most recent event. Soft blood pressures overnight--90/60--despite holding antihypertensives.  Plan Continue asprin/plavix/statin Continue allowance of permissive hypertension Continue PT/OT Continuous tele Neuro recommending 30d cardiac event monitoring outpatient to r/o afib  Hypothyroidism. TSH 0.67. T4 1.41. Plan: levothyroxine dose reduced to 1mcg Dementia. Continue aricept. 1:1 sitter Depression: continue lexapro  Best practice:  CODE STATUS: DNR Diet: cardiac DVT for prophylaxis: lovenox Dispo: lived independtly at home prior to admission. PT recommending home health PT with supervision/assistance 24h vs SNF. Will need to have further discussion with pt's son regarding ability for support at home vs SNF  Mitzi Hansen, MD [place] PGY-1 PAGER #: [phone] 05/16/19 9:20 AM

## 2019-05-16 NOTE — Plan of Care (Signed)
  Problem: Education: Goal: Knowledge of disease or condition will improve Outcome: Progressing Goal: Knowledge of secondary prevention will improve Outcome: Progressing Goal: Knowledge of patient specific risk factors addressed and post discharge goals established will improve Outcome: Progressing Goal: Individualized Educational Video(s) Outcome: Progressing   Problem: Self-Care: Goal: Verbalization of feelings and concerns over difficulty with self-care will improve Outcome: Progressing   Problem: Education: Goal: Knowledge of General Education information will improve Description: Including pain rating scale, medication(s)/side effects and non-pharmacologic comfort measures Outcome: Progressing   Problem: Health Behavior/Discharge Planning: Goal: Ability to manage health-related needs will improve Outcome: Progressing   Problem: Clinical Measurements: Goal: Ability to maintain clinical measurements within normal limits will improve Outcome: Progressing Goal: Will remain free from infection Outcome: Progressing Goal: Diagnostic test results will improve Outcome: Progressing Goal: Respiratory complications will improve Outcome: Progressing Goal: Cardiovascular complication will be avoided Outcome: Progressing   Problem: Activity: Goal: Risk for activity intolerance will decrease Outcome: Progressing   Problem: Nutrition: Goal: Adequate nutrition will be maintained Outcome: Progressing   Problem: Coping: Goal: Level of anxiety will decrease Outcome: Progressing   Problem: Elimination: Goal: Will not experience complications related to bowel motility Outcome: Progressing Goal: Will not experience complications related to urinary retention Outcome: Progressing   Problem: Pain Managment: Goal: General experience of comfort will improve Outcome: Progressing   Problem: Safety: Goal: Ability to remain free from injury will improve Outcome: Progressing    Problem: Skin Integrity: Goal: Risk for impaired skin integrity will decrease Outcome: Progressing   Ival Bible, BSN, RN

## 2019-05-17 DIAGNOSIS — I10 Essential (primary) hypertension: Secondary | ICD-10-CM

## 2019-05-17 DIAGNOSIS — F0390 Unspecified dementia without behavioral disturbance: Secondary | ICD-10-CM

## 2019-05-17 MED ORDER — RAMELTEON 8 MG PO TABS
8.0000 mg | ORAL_TABLET | Freq: Once | ORAL | Status: AC
  Administered 2019-05-17: 01:00:00 8 mg via ORAL
  Filled 2019-05-17: qty 1

## 2019-05-17 NOTE — Progress Notes (Signed)
Pt refused night meds, Alexander (IM) informed.

## 2019-05-17 NOTE — Progress Notes (Addendum)
   NAME:  SURIE SMID, MRN:  [id number], DOB:  03/07/1922, LOS: 2 ADMISSION DATE:  05/14/2019,  Primary: [name], NP   Brief History  85 yo female with htn, hyperlipidemia, carotid stenosis, hypothyroidism, and dementia who was discharged on day prior to admission for TIA (transient episode of slurred speech, confusion and gait issue) at which time she was discharged on DAPT. Full stroke workup performed on that admission was neg including MRI that negative for stroke findings. Presented with progressively worsening gait and mental status as reported by her son and was found to have a left occipital lobe infarct on imaging.   Subjective  Spoke with son yesterday afternoon. Notes that they are setting up 24h home care which would be ready on Monday. Sundowning overnight Objective   Blood pressure 138/70, pulse 88, temperature 97.8 F (36.6 C), temperature source Oral, resp. rate 18, SpO2 96 %.    Examination: General: No acute distress Cardiac: Regular rate and rhythm Pulmonary: Lung sounds clear to auscultation Abdomen: Bowel sounds active Neuro: Alert.  Face symmetric.  Strength 5 out of 5 in upper and lower bilateral extremities. Skin: No rash  Consults:  Neuro  Significant Diagnostic Tests:  11/5 CT head: left occipital lobe infarct 11/6: moderate size PCA territory infarct involving the occipital lobe without hemmorhage or mass effect.  Underlying temporal lobe cerebral atrophy with moderate chronic microvascular ischemic disease  Summary  85 yo female with htn, hyperlipidemia, carotid stenosis, and hypothyroidism admitted to the internal medicine teaching service for left occipital lobe CVA.   Assessment & Plan:  Active Problems:   Hypothyroidism   Essential hypertension   Carotid stenosis   CVA (cerebral vascular accident) ([place])  Left occipital lobe CVA. Prior CTA suggested occlusion of PCA with presence of collaterals. Most likely, collateral flow became occluded  leading to most recent event.  No A. fib noted on telemetry Plan Continue aspirin, Plavix, statin Continue PT and OT Continuous telemetry Neuro recommending 30-day cardiac event monitoring outpatient to rule out atrial fibrillation  Hypertension.  Blood pressures averaging in the [id number] systolically.  Antihypertensives initally held for permissive hypertension.  She is now out of that window.  We will continue to monitor blood pressure and treat accordingly Hypothyroidism.  Plan: continue synthroid Dementia. Continue aricept. 1:1 sitter Depression: continue lexapro  Best practice:  CODE STATUS: DNR Diet: cardiac DVT for prophylaxis: lovenox Dispo: lived independtly at home prior to admission. PT recommending home health PT with supervision/assistance 24h vs SNF. Son setting up home care which should be ready on Monday.   Mitzi Hansen, MD INTERNAL MEDICINE RESIDENT PGY-1 PAGER #: [phone] 05/17/19 10:00 AM

## 2019-05-18 DIAGNOSIS — Z882 Allergy status to sulfonamides status: Secondary | ICD-10-CM

## 2019-05-18 DIAGNOSIS — Z7902 Long term (current) use of antithrombotics/antiplatelets: Secondary | ICD-10-CM

## 2019-05-18 DIAGNOSIS — Z7982 Long term (current) use of aspirin: Secondary | ICD-10-CM

## 2019-05-18 MED ORDER — LEVOTHYROXINE SODIUM 88 MCG PO TABS: 88.0000 ug | ORAL_TABLET | Freq: Every day | ORAL | 0 refills | Status: DC

## 2019-05-18 NOTE — Progress Notes (Signed)
Pt d/c home. Pt has no new concerns, pt is alert and oriented. D/C instructions given with teach back. Pt verbalize understanding.

## 2019-05-18 NOTE — Progress Notes (Signed)
   NAME:  Maria Clarke, MRN:  [id number], DOB:  [birth year], LOS: 3 ADMISSION DATE:  05/14/2019,  Primary: [name], NP   Brief History  85 yo female with htn, hyperlipidemia, carotid stenosis, hypothyroidism, and dementia who was discharged on day prior to admission for TIA (transient episode of slurred speech, confusion and gait issue) at which time she was discharged on DAPT. Full stroke workup performed on that admission was neg including MRI that negative for stroke findings. Presented with progressively worsening gait and mental status as reported by her son and was found to have a left occipital lobe infarct on imaging.   Subjective  No overnight events  Objective   Blood pressure (!) 157/76, pulse 90, temperature 98.9 F (37.2 C), temperature source Oral, resp. rate 17, SpO2 94 %.    Examination: General: No acute distress Cardiac: Regular rate and rhythm Pulmonary: Lung sounds clear to auscultation Abdomen: Bowel sounds active Neuro: Alert.  Face symmetric.  Strength 5 out of 5 in upper and lower bilateral extremities. Skin: No rash  Consults:  Neuro  Significant Diagnostic Tests:  11/5 CT head: left occipital lobe infarct 11/6: moderate size PCA territory infarct involving the occipital lobe without hemmorhage or mass effect.  Underlying temporal lobe cerebral atrophy with moderate chronic microvascular ischemic disease  Summary  85 yo female with htn, hyperlipidemia, carotid stenosis, and hypothyroidism admitted to the internal medicine teaching service for left occipital lobe CVA.   Assessment & Plan:  Active Problems:   Hypothyroidism   Essential hypertension   Carotid stenosis   CVA (cerebral vascular accident) ([place])  Left occipital lobe CVA. Prior CTA suggested occlusion of PCA with presence of collaterals. Most likely, collateral flow became occluded leading to most recent event.  No A. fib noted on telemetry Plan Continue aspirin, Plavix, statin Continue  PT and OT Continuous telemetry Neuro recommending 30-day cardiac event monitoring outpatient to rule out atrial fibrillation  Hypertension. She is now out of the window of permissive htn. Will restart metoprolol for now and continue to hold amlodipine. Further discussion regarding restarting amlodipine per primary. Hypothyroidism.  T4 elevated on admission 1.4. Plan: continue reduced dose of synthroid of 31mcg Dementia. Continue aricept. 1:1 sitter Depression: continue lexapro  Best practice:  CODE STATUS: DNR Diet: cardiac DVT for prophylaxis: lovenox Dispo: discharging home today. Spoke with patient's son who will be ready to pick her up when ready.   Mitzi Hansen, MD INTERNAL MEDICINE RESIDENT PGY-1 PAGER #: [phone] 05/18/19 5:21 AM

## 2019-05-18 NOTE — Plan of Care (Signed)
  Problem: Education: Goal: Knowledge of disease or condition will improve Outcome: Progressing Goal: Knowledge of secondary prevention will improve Outcome: Progressing Goal: Knowledge of patient specific risk factors addressed and post discharge goals established will improve Outcome: Progressing Goal: Individualized Educational Video(s) Outcome: Progressing   Problem: Self-Care: Goal: Verbalization of feelings and concerns over difficulty with self-care will improve Outcome: Progressing   Problem: Education: Goal: Knowledge of General Education information will improve Description: Including pain rating scale, medication(s)/side effects and non-pharmacologic comfort measures Outcome: Progressing   Problem: Health Behavior/Discharge Planning: Goal: Ability to manage health-related needs will improve Outcome: Progressing   Problem: Clinical Measurements: Goal: Ability to maintain clinical measurements within normal limits will improve Outcome: Progressing Goal: Will remain free from infection Outcome: Progressing Goal: Diagnostic test results will improve Outcome: Progressing Goal: Respiratory complications will improve Outcome: Progressing Goal: Cardiovascular complication will be avoided Outcome: Progressing   Problem: Activity: Goal: Risk for activity intolerance will decrease Outcome: Progressing   Problem: Nutrition: Goal: Adequate nutrition will be maintained Outcome: Progressing   Problem: Coping: Goal: Level of anxiety will decrease Outcome: Progressing   Problem: Elimination: Goal: Will not experience complications related to bowel motility Outcome: Progressing Goal: Will not experience complications related to urinary retention Outcome: Progressing   Problem: Pain Managment: Goal: General experience of comfort will improve Outcome: Progressing   Problem: Safety: Goal: Ability to remain free from injury will improve Outcome: Progressing    Problem: Skin Integrity: Goal: Risk for impaired skin integrity will decrease Outcome: Progressing   Ival Bible, BSN, RN

## 2019-05-18 NOTE — Discharge Summary (Addendum)
Name: Maria Clarke MRN: [id number] DOB: 1921-10-16 85 y.o. PCP: [name], NP  Date of Admission: 05/14/2019 10:08 AM Date of Discharge: 05/18/19 Attending Physician: Lucious Groves, DO  Discharge Diagnosis: 1. Left occipital lobe CVA  Discharge Medications: Allergies as of 05/18/2019      Reactions   Sulfonamide Derivatives Other (See Comments)   "ORAL S.E." (family unaware what time means)      Medication List    STOP taking these medications   amLODipine 5 MG tablet Commonly known as: NORVASC   methylPREDNISolone 4 MG Tbpk tablet Commonly known as: MEDROL DOSEPAK     TAKE these medications   aspirin 325 MG tablet Take 1 tablet (325 mg total) by mouth daily.   atorvastatin 80 MG tablet Commonly known as: LIPITOR Take 1 tablet (80 mg total) by mouth daily at 6 PM.   clopidogrel 75 MG tablet Commonly known as: PLAVIX Take 1 tablet (75 mg total) by mouth daily.   cyanocobalamin 1000 MCG/ML injection Commonly known as: (VITAMIN B-12) Inject 1 mL (1,000 mcg total) into the skin every 30 (thirty) days.   diphenhydramine-acetaminophen 25-500 MG Tabs tablet Commonly known as: TYLENOL PM Take 1 tablet by mouth at bedtime.   donepezil 5 MG tablet Commonly known as: ARICEPT Take 1 tablet (5 mg total) by mouth at bedtime.   escitalopram 5 MG tablet Commonly known as: LEXAPRO Take 1 tablet (5 mg total) by mouth at bedtime.   levothyroxine 88 MCG tablet Commonly known as: SYNTHROID Take 1 tablet (88 mcg total) by mouth daily before breakfast. Start taking on: May 19, 2019 What changed:   medication strength  how much to take   metoprolol tartrate 50 MG tablet Commonly known as: LOPRESSOR TAKE 1 TABLET BY MOUTH TWICE A DAY What changed:   how much to take  how to take this  when to take this       Disposition and follow-up:   Ms.Maria Clarke was discharged from [facility] in Stable condition.  At the hospital follow  up visit please address:  1.  Left occipital lobe CVA. Discharged on asprin, plavix, statin. Home health orders for PT, OT, ST placed.  Neurology recommended outpatient 30-day telemetry monitoring for evaluation of A. fib.  No dysrhythmia was detected on telemetry during her hospitalization.   Follow-up Appointments: Follow-up Information    [facility]. Schedule an appointment as soon as possible for a visit in 4 week(s).   Specialty: Neurology Contact information: [address] [phone]       [name], NP Follow up in 1 week(s).   Specialty: Family Medicine Contact information: [address] [place] [address] [phone]        [facility] Follow up.   Specialty: [place] Why: [place] Hospital Course by problem list: 85 year old female with past medical history of hypertension, hyperlipidemia, carotid stenosis, and hypothyroidism admitted on 05/15/19 for weakness and right eye visual changes and subsequently found to have a moderate sized PCA territory infarct involving the occipital lobe.  Due to unknown LKW, TPA was deferred.  Patient was started on aspirin, Plavix, and a statin.  Symptoms improved throughout her hospitalization.  She was evaluated by therapy and was recommended for skilled nursing facility for rehabilitation however son refused and set up private nursing at home.  At time of discharge patient  was ambulating with assist of 1.  She was instructed to follow-up with her PCP and neurology at discharge.  Discharge Vitals:   BP (!) 141/75 (BP Location: Left Arm)   Pulse 93   Temp 98 F (36.7 C) (Oral)   Resp 15   SpO2 97%   Pertinent Labs, Studies, and Procedures:  11/6 MRI brain: Moderate size confluent area of restricted diffusion involving the parasagittal left occipital lobe and mesial left temporal lobe, consistent with  acute left PCA territory infarct. Involvement of the left hippocampal formation, with mild patchy involvement of the left thalamus.  Discharge Instructions: Discharge Instructions    Ambulatory referral to Neurology   Complete by: As directed    Follow up with stroke clinic NP (Jessica Vanschaick or Cecille Rubin, if both not available, consider Zachery Dauer, or [place]) at [facility] in about 4 weeks. Thanks.   Diet - low sodium heart healthy   Complete by: As directed    Increase activity slowly   Complete by: As directed       Signed: Mitzi Hansen, MD 05/18/2019, 11:28 AM   Pager: [phone]

## 2019-05-18 NOTE — TOC Transition Note (Signed)
Transition of Care [facility]) - CM/SW Discharge Note   Patient Details  Name: Maria Clarke MRN: [id number] Date of Birth: 01/02/1922  [facility] of [facility] [place] CM/SW Contact:  Pollie Friar, RN Phone Number: 05/18/2019, 10:31 AM   Clinical Narrative:    Pt readmitted before being seen at home per [facility]. Butch Penny with [facility] aware of d/c and resumption orders.  Pt's sons to provide supervision and transportation home.    Final next level of care: Home w Home Health Services Barriers to Discharge: No Barriers Identified   Patient Goals and CMS Choice   CMS Medicare.gov Compare Post Acute Care list provided to:: Patient Represenative (must comment) Choice offered to / list presented to : Adult Children(at last admission)  Discharge Placement                       Discharge Plan and Services                          HH Arranged: PT, OT, Speech Therapy [place]: [place] ([facility]) Date [place]: 05/18/19   Representative spoke with at [place]: [place] ([place]) Interventions     Readmission Risk Interventions No flowsheet data found.

## 2019-05-18 NOTE — Discharge Instructions (Signed)
Thank you for allowing the internal medicine team to care for you during your hospitalization. The reason for your symptoms when you came in was a stroke involving part of your occipital lobe (back part of your brain). For this, I would like you to continue taking asprin 325mg , plavix (blood thinner) and statin (cholesterol). You will need to follow up with your primary care doctor in 5-7 days. You will also need to have an appointment with the neurologist. It was a pleasure meeting you during your hospitalization and our internal medicine service wishes you the very best!

## 2019-05-18 NOTE — Progress Notes (Signed)
Occupational Therapy Treatment Patient Details Name: Maria Clarke MRN: [id number] DOB: [birth year] Today's Date: 05/18/2019    History of present illness 85 yo female with PMH of right carotid stenosis (60-79%), hypertension, hyperlipidemia, hypothyroidism, and dementia who presented for worsening confusion and LLE weakness found to have new R sided visual field deficit. CT Head notable for new moderate L occipital lobe infarction without evidence of hemorrhage.  PMH positive for Atrial tachycardia ([place]), Carotid artery disease ([place]), Depression, Hypercholesteremia, Hypertension, and Hypothyroidism.   OT comments  Pt with improvement in R visual field, but also more alert than last visit. Focus of session on navigating in room locating ADL items followed by toileting and grooming at sink. Pt requiring min assist. No family available to educate in safety and compensatory strategies for R field cut, attempted to reach son by phone unsuccessfully. Pt to discharge today with 24 hour care and [place].  Follow Up Recommendations  Home health OT;Supervision/Assistance - 24 hour    Equipment Recommendations       Recommendations for Other Services      Precautions / Restrictions Precautions Precautions: Fall Precaution Comments: R field cut Restrictions Weight Bearing Restrictions: No       Mobility Bed Mobility               General bed mobility comments: patient up in recliner  Transfers Overall transfer level: Needs assistance Equipment used: 1 person hand held assist Transfers: Sit to/from Stand Sit to Stand: Min guard              Balance Overall balance assessment: Needs assistance Sitting-balance support: Feet supported Sitting balance-[name] Scale: Good     Standing balance support: Single extremity supported Standing balance-[name] Scale: Fair                             ADL either performed or assessed with clinical judgement   ADL Overall ADL's  : Needs assistance/impaired     Grooming: Oral care;Standing;Minimal assistance                   Toilet Transfer: Min guard;Ambulation   Toileting- Clothing Manipulation and Hygiene: Min guard;Sit to/from stand         General ADL Comments: pt needing verbal and auditory cues to locate ADL items on sink in R hemispace     Vision       Perception     Praxis      Cognition Arousal/Alertness: Awake/alert Behavior During Therapy: WFL for tasks assessed/performed Overall Cognitive Status: History of cognitive impairments - at baseline Area of Impairment: Memory;Awareness                 Orientation Level: Disoriented to;Time;Place;Situation Current Attention Level: Focused Memory: Decreased short-term memory Following Commands: Follows one step commands consistently Safety/Judgement: Decreased awareness of safety;Decreased awareness of deficits Awareness: Intellectual Problem Solving: Slow processing;Requires verbal cues          Exercises     Shoulder Instructions       General Comments      Pertinent Vitals/ Pain       Pain Assessment: No/denies pain  Home Living                                          Prior Functioning/Environment  Frequency  Min 3X/week        Progress Toward Goals  OT Goals(current goals can now be found in the care plan section)  Progress towards OT goals: Progressing toward goals  Acute Rehab OT Goals Patient Stated Goal: Return home to her cats OT Goal Formulation: Patient unable to participate in goal setting Time For Goal Achievement: 05/29/19 Potential to Achieve Goals: Good  Plan Discharge plan remains appropriate    Co-evaluation                 AM-PAC OT "6 Clicks" Daily Activity     Outcome Measure   Help from another person eating meals?: A Little Help from another person taking care of personal grooming?: A Little Help from another person toileting,  which includes using toliet, bedpan, or urinal?: A Little Help from another person bathing (including washing, rinsing, drying)?: A Little Help from another person to put on and taking off regular upper body clothing?: A Little Help from another person to put on and taking off regular lower body clothing?: A Little 6 Click Score: 18    End of Session    OT Visit Diagnosis: Muscle weakness (generalized) (M62.81);Unsteadiness on feet (R26.81);Other symptoms and signs involving cognitive function   Activity Tolerance Patient tolerated treatment well   Patient Left in chair;with call bell/phone within reach;with chair alarm set   Nurse Communication          Time: [phone] OT Time Calculation (min): 18 min  Charges: OT General Charges $OT Visit: 1 Visit OT Treatments $Self Care/Home Management : 8-22 mins  Nestor Lewandowsky, OTR/L Acute Rehabilitation Services Pager: [phone] Office: [phone]   Malka So 05/18/2019, 12:12 PM

## 2019-05-18 NOTE — Progress Notes (Signed)
Physical Therapy Treatment Patient Details Name: Maria Clarke MRN: [id number] DOB: 11/10/1921 Today's Date: 05/18/2019    History of Present Illness 85 yo female with PMH of right carotid stenosis (60-79%), hypertension, hyperlipidemia, hypothyroidism, and dementia who presented for worsening confusion and LLE weakness found to have new R sided visual field deficit. CT Head notable for new moderate L occipital lobe infarction without evidence of hemorrhage.  PMH positive for Atrial tachycardia ([place]), Carotid artery disease ([place]), Depression, Hypercholesteremia, Hypertension, and Hypothyroidism.    PT Comments    Patient received in recliner, alert, short term memory deficits. Patient requires hand held assist for sit to stand due to unsteadiness. Ambulated 120 feet with hand held assist. Reports left LE weakness with ambulation. Patient will benefit from 24 hour assistance for safety due to decreased memory and awareness. Patient will benefit from continued skilled PT while here to improve strength.        Follow Up Recommendations  Home health PT;Supervision/Assistance - 24 hour     Equipment Recommendations  None recommended by PT    Recommendations for Other Services       Precautions / Restrictions Precautions Precautions: Fall Restrictions Weight Bearing Restrictions: No    Mobility  Bed Mobility               General bed mobility comments: patient up in recliner  Transfers Overall transfer level: Needs assistance Equipment used: 1 person hand held assist Transfers: Sit to/from Stand Sit to Stand: Min guard            Ambulation/Gait Ambulation/Gait assistance: Min assist Gait Distance (Feet): 120 Feet Assistive device: 1 person hand held assist Gait Pattern/deviations: Step-through pattern Gait velocity: WFL   General Gait Details: Patient with increased tolerance and awareness this day.   Stairs             Wheelchair Mobility     Modified Rankin (Stroke Patients Only) Modified Rankin (Stroke Patients Only) Pre-Morbid Rankin Score: Moderate disability Modified Rankin: Moderately severe disability     Balance Overall balance assessment: Needs assistance Sitting-balance support: Feet supported Sitting balance-[name] Scale: Good     Standing balance support: Single extremity supported Standing balance-[name] Scale: Fair                              Cognition Arousal/Alertness: Awake/alert Behavior During Therapy: WFL for tasks assessed/performed Overall Cognitive Status: No family/caregiver present to determine baseline cognitive functioning Area of Impairment: Memory;Awareness                 Orientation Level: Disoriented to;Time;Place;Situation Current Attention Level: Focused Memory: Decreased short-term memory Following Commands: Follows one step commands consistently Safety/Judgement: Decreased awareness of safety;Decreased awareness of deficits Awareness: Intellectual Problem Solving: Slow processing;Requires verbal cues        Exercises      General Comments        Pertinent Vitals/Pain Pain Assessment: No/denies pain    Home Living                      Prior Function            PT Goals (current goals can now be found in the care plan section) Acute Rehab PT Goals Patient Stated Goal: Return home to her cats PT Goal Formulation: With patient Time For Goal Achievement: 05/28/19 Potential to Achieve Goals: Fair Progress towards PT goals: Progressing toward goals  Frequency    Min 4X/week      PT Plan Current plan remains appropriate    Co-evaluation              AM-PAC PT "6 Clicks" Mobility   Outcome Measure  Help needed turning from your back to your side while in a flat bed without using bedrails?: None Help needed moving from lying on your back to sitting on the side of a flat bed without using bedrails?: None Help needed  moving to and from a bed to a chair (including a wheelchair)?: A Little Help needed standing up from a chair using your arms (e.g., wheelchair or bedside chair)?: A Little Help needed to walk in hospital room?: A Little Help needed climbing 3-5 steps with a railing? : A Little 6 Click Score: 20    End of Session Equipment Utilized During Treatment: Gait belt Activity Tolerance: Patient tolerated treatment well Patient left: in chair;with chair alarm set;with call bell/phone within reach Nurse Communication: Mobility status PT Visit Diagnosis: Unsteadiness on feet (R26.81);Muscle weakness (generalized) (M62.81)     Time: [id number] PT Time Calculation (min) (ACUTE ONLY): 14 min  Charges:  $Gait Training: 8-22 mins                     Laura-Lee Villegas, PT, GCS 05/18/19,11:19 AM

## 2019-05-19 ENCOUNTER — Telehealth: Payer: Self-pay | Admitting: *Deleted

## 2019-05-19 NOTE — Telephone Encounter (Signed)
Attempted to contact patient. A gentleman answered the phone. The gentleman informed me he was the neighbor and the patient was asleep. He would have someone to call me when a family member gets home.

## 2019-05-20 NOTE — Telephone Encounter (Signed)
Transition Care Management Follow-up Telephone Call  Date of Admission: 05/14/2019 10:08 AM Date of Discharge: 05/18/19 Attending Physician: Lucious Groves, DO  Discharge Diagnosis: 1. Left occipital lobe CVA   How have you been since you were released from the hospital? Doing well - per patient.  Son states that she is having memory issues.   Do you understand why you were in the hospital? yes   Do you understand the discharge instructions? yes   Where were you discharged to? home   Items Reviewed:  Medications reviewed: yes  Allergies reviewed: yes  Dietary changes reviewed: yes  Referrals reviewed: yes   Functional Questionnaire:   Activities of Daily Living (ADLs):   She states they are independent in the following: has someone in the home helping her States they require assistance with the following:    Any transportation issues/concerns?: No    Any patient concerns? yes   Confirmed importance and date/time of follow-up visits scheduled yes  Provider Appointment booked with [name] 05/28/2019 at 7:30 am.  Confirmed with patient if condition begins to worsen call PCP or go to the ER.  Patient was given the office number and encouraged to call back with question or concerns.  : yes

## 2019-05-22 ENCOUNTER — Telehealth: Payer: Self-pay | Admitting: Adult Health

## 2019-05-22 NOTE — Telephone Encounter (Signed)
[facility]ie from [facility] called stating there is a delay from doing PT evale till next week, patient is too tired Call back [phone]

## 2019-05-26 ENCOUNTER — Telehealth: Payer: Self-pay | Admitting: Family Medicine

## 2019-05-26 NOTE — Telephone Encounter (Signed)
Copied from [place] [id number]. Topic: Referral - Status >> May 25, 2019  1:45 PM Berneta Levins wrote: Reason for CRM:   Ebony Hail with [facility] called.  States that pt has refused OT and PT and when contacted for speech - Ebony Hail was told by the son Maria Clarke that they want to wait until after neurology appointment (06/09/2019) to even address this.   Per Ebony Hail they are closing referrals for now because they can not hold for that long and they will need to have new orders sent closer to if pt still wants services. Ebony Hail can be reached at [phone]

## 2019-05-27 ENCOUNTER — Other Ambulatory Visit: Payer: Self-pay

## 2019-05-28 ENCOUNTER — Encounter: Payer: Self-pay | Admitting: Adult Health

## 2019-05-28 ENCOUNTER — Ambulatory Visit (INDEPENDENT_AMBULATORY_CARE_PROVIDER_SITE_OTHER): Payer: Medicare Other | Admitting: Adult Health

## 2019-05-28 VITALS — BP 158/80 | Temp 97.5°F | Wt 130.0 lb

## 2019-05-28 DIAGNOSIS — E039 Hypothyroidism, unspecified: Secondary | ICD-10-CM | POA: Diagnosis not present

## 2019-05-28 DIAGNOSIS — Z8673 Personal history of transient ischemic attack (TIA), and cerebral infarction without residual deficits: Secondary | ICD-10-CM | POA: Diagnosis not present

## 2019-05-28 DIAGNOSIS — I10 Essential (primary) hypertension: Secondary | ICD-10-CM

## 2019-05-28 DIAGNOSIS — R4189 Other symptoms and signs involving cognitive functions and awareness: Secondary | ICD-10-CM

## 2019-05-28 DIAGNOSIS — I63532 Cerebral infarction due to unspecified occlusion or stenosis of left posterior cerebral artery: Secondary | ICD-10-CM

## 2019-05-28 NOTE — Progress Notes (Signed)
Subjective:    Patient ID: Maria Clarke, female    DOB: 1921-07-27, 85 y.o.   MRN: [id number]  HPI 85 year old female who  has a past medical history of Atrial tachycardia ([place]), Carotid artery disease ([place]), Depression, Hypercholesteremia, Hypertension, and Hypothyroidism.  She presents with her son Maria Clarke) today for TCM visit   Admit Date: 05/14/2019 Discharge Date: 05/18/2019  She was admitted on 05/15/2019 for weakness and right eye visual changes and was subsequently found to have a moderate size PCA territory infarct involving the occipital lobe.  Due to her unknown last known well time, TPA was deferred.  She was started on aspirin, Plavix, and a statin.  Her symptoms improved throughout her hospitalization.  She was evaluated by therapy and was recommended for skilled nursing facility for rehabilitation however family refused and set up private nursing at home.  At the time of discharge the patient was ambulating with the assist of 1.  She was instructed to follow-up with her PCP and neurology at discharge.  Neurology recommended outpatient 30-day telemetry monitoring for evaluation of A. fib.  There were no dysrhythmias detected on telemetry during her hospitalization.  During her hospitalization amlodipine was discontinued.  Additionally she was found to have a TSH of 0.67, free T4 was elevated at 1.4 and given her age and concern for atrial fibrillation her dose of Synthroid was reduced to 88 mcg daily.  Her son who provides most of the history today reports that home health has been monitoring her blood pressures and reports readings in the 130s to 140s over 70s to 80s.  After the stroke her "memory was wiped, she has dementia at baseline but the stroke seems to precipitated worsening cognitive issues.".  Her son reports that she is having trouble remembering names of family members including her son's, people that she has been friends with for many years, and sometimes does not remember  where she is.  When this writer walked into the room patient immediately stated "who are you and why am I here?".  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  When I asked who she was with she looked at her side and said" who are you?"  It took her a few moments to realize that it was her son.  She was asked why she was in the hospital she stated "I do not know".  Her son reports that her appetite is good, she is complaining of no headaches, chest pain, or shortness of breath.   Review of Systems  Constitutional: Negative.   Respiratory: Negative.   Cardiovascular: Negative.   Gastrointestinal: Negative.   Endocrine: Negative.   Genitourinary: Negative.   Musculoskeletal: Negative.   Skin: Negative.   Neurological: Negative.   Psychiatric/Behavioral: Positive for confusion.  All other systems reviewed and are negative.  Past Medical History:  Diagnosis Date  . Atrial tachycardia ([place])    a. Noted 02/2013 on EKG.  . Carotid artery disease ([place])    Carotid duplex showed [id number] RICA and [id number] LICA  . Depression   . Hypercholesteremia   . Hypertension   . Hypothyroidism     Social History   Socioeconomic History  . Marital status: Single    Spouse name: Not on file  . Number of children: Not on file  . Years of education: Not on file  . Highest education level: Not on file  Occupational History  . Occupation: Works part-time in [employer]  . Financial resource strain: Not on file  . Food insecurity    Worry: Not on file    Inability: Not on file  . Transportation needs    Medical: Not on file    Non-medical: Not on file  Tobacco Use  . Smoking status: Never Smoker  . Smokeless tobacco: Never Used  Substance and Sexual Activity  . Alcohol use: No  . Drug use: No  . Sexual activity: Not on file  Lifestyle  . Physical activity    Days per week: Not on file    Minutes  per session: Not on file  . Stress: Not on file  Relationships  . Social Herbalist on phone: Not on file    Gets together: Not on file    Attends religious service: Not on file    Active member of club or organization: Not on file    Attends meetings of clubs or organizations: Not on file    Relationship status: Not on file  . Intimate partner violence    Fear of current or ex partner: Not on file    Emotionally abused: Not on file    Physically abused: Not on file    Forced sexual activity: Not on file  Other Topics Concern  . Not on file  Social History Narrative   Independent, family helps in her care.    Past Surgical History:  Procedure Laterality Date  . BREAST SURGERY Left    Benign Tumor- several years ago  . CESAREAN SECTION    . EYE SURGERY Bilateral    cataract    Family History  Problem Relation Age of Onset  . Diabetes Mother   . Heart disease Mother        After age 2  . Hyperlipidemia Mother   . Hypertension Mother   . Heart disease Sister        After age 2  . Asthma Sister   . Hyperlipidemia Sister   . Heart disease Brother        After age 74  . Diabetes Son   . Hypertension Son     Allergies  Allergen Reactions  . Sulfonamide Derivatives Other (See Comments)    "ORAL S.E." (family unaware what time means)    Current Outpatient Medications on File Prior to Visit  Medication Sig Dispense Refill  . aspirin 325 MG tablet Take 1 tablet (325 mg total) by mouth daily. 30 tablet 0  . atorvastatin (LIPITOR) 80 MG tablet Take 1 tablet (80 mg total) by mouth daily at 6 PM. 30 tablet 0  . clopidogrel (PLAVIX) 75 MG tablet Take 1 tablet (75 mg total) by mouth daily. 30 tablet 0  . cyanocobalamin (,VITAMIN B-12,) 1000 MCG/ML injection Inject 1 mL (1,000 mcg total) into the skin every 30 (thirty) days. 3 mL 1  . diphenhydramine-acetaminophen (TYLENOL PM) 25-500 MG TABS tablet Take 1 tablet by mouth at bedtime.    . donepezil (ARICEPT) 5 MG  tablet Take 1 tablet (5 mg total) by mouth at bedtime. 90 tablet 4  . escitalopram (LEXAPRO) 5 MG tablet Take 1 tablet (5 mg total) by mouth at bedtime. 90 tablet 1  . levothyroxine (SYNTHROID) 88 MCG tablet Take 1 tablet (88 mcg total) by mouth daily before breakfast. 30 tablet 0  . metoprolol tartrate (LOPRESSOR) 50 MG tablet TAKE 1 TABLET BY MOUTH TWICE A DAY (Patient  taking differently: Take 50 mg by mouth 2 (two) times daily. TAKE 1 TABLET BY MOUTH TWICE A DAY) 180 tablet 1   No current facility-administered medications on file prior to visit.     BP (!) 158/80 Comment: no meds  Temp (!) 97.5 F (36.4 C)   Wt 130 lb (59 kg)   BMI 25.39 kg/m       Objective:   Physical Exam Vitals signs and nursing note reviewed.  Constitutional:      Appearance: Normal appearance.  Cardiovascular:     Rate and Rhythm: Normal rate and regular rhythm.     Pulses: Normal pulses.     Heart sounds: Normal heart sounds.  Pulmonary:     Effort: Pulmonary effort is normal. No respiratory distress.     Breath sounds: Normal breath sounds. No stridor. No wheezing, rhonchi or rales.  Chest:     Chest wall: No tenderness.  Skin:    General: Skin is warm and dry.     Coloration: Skin is not jaundiced or pale.     Findings: No bruising, erythema, lesion or rash.  Neurological:     General: No focal deficit present.     Mental Status: She is alert and oriented to person, place, and time.     Cranial Nerves: No cranial nerve deficit.     Sensory: No sensory deficit.     Motor: No weakness.     Coordination: Coordination normal.     Gait: Gait normal.     Deep Tendon Reflexes: Reflexes normal.  Psychiatric:        Mood and Affect: Mood and affect normal.        Speech: Speech normal.        Behavior: Behavior normal.        Cognition and Memory: Cognition is not impaired. Memory is not impaired. She does not exhibit impaired recent memory or impaired remote memory.       Assessment & Plan:   1. Cerebrovascular accident (CVA) due to occlusion of left posterior cerebral artery ([place]) -We reviewed hospital discharge notes, labs, and imaging.  All patients answered to the best of my ability. - Cardiac event monitor; Future  2. Essential hypertension Continue with metoprolol 25 mg twice daily -Continue to monitor at home.  Return precautions reviewed with family member  3. Hypothyroidism, unspecified type  - TSH; Future  4. Cognitive impairment -Continue with Aricept 5 mg.  Follow-up with neurology as directed on December 1  [name], NP

## 2019-05-31 ENCOUNTER — Other Ambulatory Visit: Payer: Self-pay | Admitting: Adult Health

## 2019-05-31 DIAGNOSIS — E038 Other specified hypothyroidism: Secondary | ICD-10-CM

## 2019-06-01 NOTE — Telephone Encounter (Signed)
DENIED.  PT IS NOW TAKING 88 MCG.

## 2019-06-02 ENCOUNTER — Other Ambulatory Visit: Payer: Self-pay | Admitting: *Deleted

## 2019-06-02 NOTE — Patient Outreach (Signed)
[facility]) Care Management  06/02/2019  Maria Clarke [birth year] [id number]   EMMI- stroke  RED ON EMMI ALERT Day # 13 Date: Monday 06/01/19 1305  Red Alert Reason:Feeling worse overall?Yes  New problems walking/talking/speaking/seeing?Yes  Insurance: medicare [facility] admissions x  ED visits x in the last 6 months  D/c from [facility] with advance home care   EMMI:  Pending   Social: Maria Clarke is a 86 year old female  She has support of her sons    Conditions: Cerebrovascular accident (CVA) due to occlusion of left posterior cerebral artery , HTN, hypothyroidism   Appointments: Neurologist 06/09/19   Outreach attempt # 1 unsuccessful  One of her sons answered and reported she was in the bed at the time of the call. He reports she is doing fair [facility] RN CM left HIPAA compliant voicemail message along with CM's contact info.   Plan: [facility] RN CM sent an unsuccessful outreach letter and scheduled this patient for another call attempt within 4-7 business days  Kimberly L. [name], RN, BSN, Startup Coordinator Office number [phone] Mobile number [phone]  Main [facility] number [phone] Fax number [phone]

## 2019-06-04 ENCOUNTER — Other Ambulatory Visit: Payer: Self-pay | Admitting: Internal Medicine

## 2019-06-08 ENCOUNTER — Other Ambulatory Visit: Payer: Self-pay | Admitting: *Deleted

## 2019-06-08 NOTE — Patient Outreach (Signed)
[place]) Care Management  06/08/2019  RAYANA PHILSON 09-24-1921 [id number]   EMMI- stroke  RED ON EMMI ALERT Day # 13 Date: Monday 06/01/19 1305  Red Alert Reason:Feeling worse overall?Yes  New problems walking/talking/speaking/seeing?Yes  Insurance: medicare [facility] admissions x  ED visits x in the last 6 months  D/c from [facility] with advance home care   EMMI:  Pending   Social: Mrs Gossett is a 85 year old female  She has support of her sons    Conditions: Cerebrovascular accident (CVA) due to occlusion of left posterior cerebral artery , HTN, hypothyroidism   Appointments: Neurologist 06/09/19   Outreach attempt # 2 unsuccessful  Caregiver Doug Masters answered and reported he would give a message to he son [name] to return the call. [facility] RN CM left HIPAA compliant voicemail message along with CM's contact info.   Plan: [facility] RN CM scheduled this patient for the last call attempt within 4-7 business days  Lamar Meter L. [name], RN, BSN, [place] Coordinator Office number [phone] Mobile number [phone]  Main [facility] number [phone] Fax number [phone]

## 2019-06-09 ENCOUNTER — Other Ambulatory Visit: Payer: Self-pay

## 2019-06-09 ENCOUNTER — Other Ambulatory Visit: Payer: Self-pay | Admitting: *Deleted

## 2019-06-09 ENCOUNTER — Ambulatory Visit (INDEPENDENT_AMBULATORY_CARE_PROVIDER_SITE_OTHER): Payer: Medicare Other | Admitting: Adult Health

## 2019-06-09 ENCOUNTER — Encounter: Payer: Self-pay | Admitting: Adult Health

## 2019-06-09 VITALS — BP 142/73 | HR 64 | Temp 97.0°F | Ht 63.0 in | Wt 127.8 lb

## 2019-06-09 DIAGNOSIS — F0151 Vascular dementia with behavioral disturbance: Secondary | ICD-10-CM | POA: Diagnosis not present

## 2019-06-09 DIAGNOSIS — I10 Essential (primary) hypertension: Secondary | ICD-10-CM | POA: Diagnosis not present

## 2019-06-09 DIAGNOSIS — I63532 Cerebral infarction due to unspecified occlusion or stenosis of left posterior cerebral artery: Secondary | ICD-10-CM | POA: Diagnosis not present

## 2019-06-09 DIAGNOSIS — E785 Hyperlipidemia, unspecified: Secondary | ICD-10-CM

## 2019-06-09 DIAGNOSIS — I6521 Occlusion and stenosis of right carotid artery: Secondary | ICD-10-CM

## 2019-06-09 DIAGNOSIS — F01518 Vascular dementia, unspecified severity, with other behavioral disturbance: Secondary | ICD-10-CM

## 2019-06-09 MED ORDER — CLOPIDOGREL BISULFATE 75 MG PO TABS: 75.0000 mg | ORAL_TABLET | Freq: Every day | ORAL | 0 refills | Status: DC

## 2019-06-09 MED ORDER — ATORVASTATIN CALCIUM 80 MG PO TABS: 80.0000 mg | ORAL_TABLET | Freq: Every day | ORAL | 3 refills | Status: AC

## 2019-06-09 MED ORDER — MIRTAZAPINE 7.5 MG PO TABS: 7.5000 mg | ORAL_TABLET | Freq: Every day | ORAL | 3 refills | Status: DC

## 2019-06-09 NOTE — Patient Outreach (Signed)
[facility]) Care Management  06/09/2019  AMIRIA MARGHEIM 08/04/1921 [id number]   EMMI- stroke  RED ON EMMI ALERT Day #13 Date:Monday 06/01/19 1305 Red Alert Reason:Feeling worse overall?Yes  New problems walking/talking/speaking/seeing?Yes  Insurance:medicare [facility] admissions x ED visits x in the last 6 months  D/c from [facility] with advance home care  EMMI: Pending  Social:Mrs [place] is a 85 year old female  She has support of her sons DPR scanned in [place] on 12/30/109 indicates permission is given to speak with sons, [name] and [name]    Conditions:Cerebrovascular accident (CVA) due to occlusion of left posterior cerebral artery, HTN, hypothyroidism   Appointments:Neurologist 06/09/19  Outreach attempt # 3 unsuccessful  a message to her son Phillipwas left vis his goggle assistant after first call attempt phone call "dropped" . [facility] RN CM left HIPAA compliant voicemail message along with CM's contact info.   Plan: [facility] RN CM scheduled this patient for case closure if no return call from pt or sons within4-7business days per [facility] call attempt workflow  Kimberly L. [name], RN, BSN, [place] Coordinator Office number [phone] Mobile number [phone]  Main [facility] number [phone] Fax number [phone]

## 2019-06-09 NOTE — Progress Notes (Signed)
[facility] [address] [place]. [place] [address] [phone]       HOSPITAL FOLLOW UP NOTE  [name] Date of Birth:  05-Nov-1921 Medical Record Number:  [id number]   Reason for Referral:  hospital stroke follow up    CHIEF COMPLAINT:  Chief Complaint  Patient presents with   Follow-up    Tx room, with son. Doing well. Son states she has 2 strokes, memory loss, sleeping more, eating is "okay", under constant care.    HPI: Maria Clarke being seen today for in office hospital follow-up regarding left PCA and thalamic infarct due to chronic left PCA occlusion in setting of hypotension on 05/12/2019.Marland Kitchen  History obtained from patient, son and chart review. Reviewed all radiology images and labs personally.  Maria D Allenis a 85 y.o.femalewith history of atrial tachycardia but noatrial fibrillation, asymptomatic R ICA stenosis, cognitive deficits, depression, hypothyroidism, HTN, and HLD who presented on 05/12/2019 with slurred speech and difficulty walking.  Stroke work-up completed and felt symptoms related to encephalopathy in setting of dementia and hypertension versus TIA as nonspecific symptoms not able to pinpoint to right ICA system.  She was discharged on 05/13/2019. Returned on 05/14/2019 with worsened confusion and LLE weakness with new R visual field defect.Shedid not receive IV t-PA due to unknown LKW.  Stroke work-up showed left PCA and thalamic infarct as evidenced on MRI due to chronic left PCA occlusion likely in the setting of hypotension.  CTA head/neck (11/3) right ICA 65% stenosis, moderate distal right M1 stenosis, decreased perfusion right MCA, left PCA occlusion with collaterals, proximal right P2 high-grade stenosis with collaterals, aortic arch and left ICA bifurcation, VA origins and bilateral cavernous ICA arthrosclerosis.  2D echo (11/4) showed EF of 65 to 70% without cardiac source of embolus identified.  Recommended 30-day  cardiac event monitor as cardioembolic source cannot be ruled out.  Recommended DAPT for 3 months and aspirin alone.  Due to chronic left PCA occlusion, recommended BP goal 130-150 to ensure adequate perfusion.  Known history of carotid stenosis who is known to [facility] with recent ultrasound on 09/2018.  Recommended continued follow-up with [facility] for asymptomatic right ICA stenosis for monitoring and management.  LDL 174 currently on atorvastatin 80 mg daily.  A1c 5.8 without prior history or evidence of DM.  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  Other stroke risk factors include advanced age but no prior history of stroke.  Other active problems include atrial tachycardia on metoprolol 50 mg twice daily, depression on Lexapro and hypothyroidism.  She was discharged home in stable condition recommendation home health therapies.  Maria Clarke is a 85 year old female who is being seen today for hospital follow-up accompanied by her son.  Residual deficits of worsening cognitive impairment from baseline and right visual defect.  She continues to live in her own home but is currently receiving 24-hour supervision and aide assistance for bathing twice weekly.  Son assist with majority of IADLs.  Patient reports stable cognition and when questioned regarding ADLs and IADLs, she reports able to maintain all independently even driving.  Son denies patient continue to drive and needs assistance as above.  She has continued on Aricept 5 mg daily.  He does report increased anxiety accompanied by increased agitation and confusion randomly throughout the day.  She has remained on Lexapro 5 mg nightly.  Recommended  home health therapies at discharge but has not been initiated at this time as son was awaiting for today's visit prior to initiating.  Son does report slight worsening of  ambulation but is able to ambulate without use of assistive device.  Continues on aspirin 325 mg daily and clopidogrel 75 mg daily without bleeding or bruising.  Continues on atorvastatin 80 mg daily without myalgias.  Blood pressure today satisfactory at 142/73.  Blood pressure is monitored at home and typically SBP 1 60-1 70 prior to medications but will be SBP 140s after medication administration.  She has not had follow-up with ophthalmology since discharge.  No further concerns at this time.      ROS:   14 system review of systems performed and negative with exception of confusion, memory loss, anxiety, depression and visual impairment  PMH:  Past Medical History:  Diagnosis Date   Atrial tachycardia ([place])    a. Noted 02/2013 on EKG.   Carotid artery disease ([facility])    Carotid duplex showed [id number] RICA and [id number] LICA   Depression    Hypercholesteremia    Hypertension    Hypothyroidism     PSH:  Past Surgical History:  Procedure Laterality Date   BREAST SURGERY Left    Benign Tumor- several years ago   CESAREAN SECTION     EYE SURGERY Bilateral    cataract    Social History:  Social History   Socioeconomic History   Marital status: Single    Spouse name: Not on file   Number of children: Not on file   Years of education: Not on file   Highest education level: Not on file  Occupational History   Occupation: Works part-time in Estate manager/land agent strain: Not on file   Food insecurity    Worry: Not on file    Inability: Not on file   Transportation needs    Medical: Not on file    Non-medical: Not on file  Tobacco Use   Smoking status: Never Smoker   Smokeless tobacco: Never Used  Substance and Sexual Activity   Alcohol use: No   Drug use: No   Sexual activity: Not on file  Lifestyle   Physical activity    Days per week: Not on file    Minutes per session: Not on file   Stress: Not on file  Relationships     Social connections    Talks on phone: Not on file    Gets together: Not on file    Attends religious service: Not on file    Active member of club or organization: Not on file    Attends meetings of clubs or organizations: Not on file    Relationship status: Not on file   Intimate partner violence    Fear of current or ex partner: Not on file    Emotionally abused: Not on file    Physically abused: Not on file    Forced sexual activity: Not on file  Other Topics Concern   Not on file  Social History Narrative   Independent, family helps in her care.    Family History:  Family History  Problem Relation Age of Onset   Diabetes Mother    Heart disease Mother        After age 64   Hyperlipidemia Mother    Hypertension Mother    Heart disease Sister        After age 93  Asthma Sister    Hyperlipidemia Sister    Heart disease Brother        After age 41   Diabetes Son    Hypertension Son     Medications:   Current Outpatient Medications on File Prior to Visit  Medication Sig Dispense Refill   aspirin 325 MG tablet Take 1 tablet (325 mg total) by mouth daily. 30 tablet 0   cyanocobalamin (,VITAMIN B-12,) 1000 MCG/ML injection Inject 1 mL (1,000 mcg total) into the skin every 30 (thirty) days. 3 mL 1   donepezil (ARICEPT) 5 MG tablet Take 1 tablet (5 mg total) by mouth at bedtime. 90 tablet 4   escitalopram (LEXAPRO) 5 MG tablet Take 1 tablet (5 mg total) by mouth at bedtime. 90 tablet 1   levothyroxine (SYNTHROID) 88 MCG tablet Take 1 tablet (88 mcg total) by mouth daily before breakfast. 30 tablet 0   metoprolol tartrate (LOPRESSOR) 50 MG tablet TAKE 1 TABLET BY MOUTH TWICE A DAY (Patient taking differently: Take 50 mg by mouth 2 (two) times daily. TAKE 1 TABLET BY MOUTH TWICE A DAY) 180 tablet 1   No current facility-administered medications on file prior to visit.     Allergies:   Allergies  Allergen Reactions   Sulfonamide Derivatives Other  (See Comments)    "ORAL S.E." (family unaware what time means)     Physical Exam  Vitals:   06/09/19 0917  BP: (!) 142/73  Pulse: 64  Temp: (!) 97 F (36.1 C)  Weight: 127 lb 12.8 oz (58 kg)  Height: 5\' 3"  (1.6 m)   Body mass index is 22.64 kg/m. No exam data present  Depression screen PHQ 2/9 06/09/2019  Decreased Interest 1  Down, Depressed, Hopeless 2  PHQ - 2 Score 3  Altered sleeping 3  Tired, decreased energy 2  Change in appetite 3  Feeling bad or failure about yourself  2  Trouble concentrating 3  Moving slowly or fidgety/restless 2  Suicidal thoughts 2  PHQ-9 Score 20  Difficult doing work/chores Very difficult    GAD 7 : Generalized Anxiety Score 06/09/2019  Nervous, Anxious, on Edge 2  Control/stop worrying 2  Worry too much - different things 2  Trouble relaxing 2  Restless 2  Easily annoyed or irritable 2  Afraid - awful might happen 2  Total GAD 7 Score 14  Anxiety Difficulty Extremely difficult      General: Frail pleasant elderly Caucasian female, seated, in no evident distress Head: head normocephalic and atraumatic.   Neck: supple with no carotid or supraclavicular bruits Cardiovascular: regular rate and rhythm, no murmurs Musculoskeletal: no deformity Skin:  no rash/petichiae Vascular:  Normal pulses all extremities   Neurologic Exam Mental Status: Awake and fully alert.   Disoriented to time and current location but able to recite home address. Recent and remote memory diminished. Attention span, concentration and fund of knowledge diminished. Mood and affect appropriate and very pleasant throughout visit.  Cranial Nerves: Fundoscopic exam reveals sharp disc margins. Pupils equal, briskly reactive to light. Extraocular movements full without nystagmus. Visual fields difficulty assessing due to cognitive impairment but does not blink to threat in all visual fields. Hearing impaired to voice. Facial sensation intact. Face, tongue, palate moves  normally and symmetrically.  Motor: Normal bulk and tone. Normal strength in all tested extremity muscles. Sensory.: intact to touch , pinprick , position and vibratory sensation.  Coordination: Rapid alternating movements normal in all extremities. Finger-to-nose and heel-to-shin performed  accurately bilaterally. Gait and Station: Arises from chair without difficulty. Stance is normal. Gait demonstrates normal stride length with imbalance Reflexes: 1+ and symmetric. Toes downgoing.     NIHSS  2-3 (possible visual impairment) Modified Rankin  3-4 (due to cognitive impairment)    Diagnostic Data (Labs, Imaging, Testing)  CT HEAD WO CONTRAST 05/14/2019 IMPRESSION: New moderate size left occipital lobe infarction, likely late acute. Of note, left PCA was occluded on recent CTA. No acute intracranial hemorrhage. Stable chronic findings detailed above.  CT ANGIO HEAD W OR WO CONTRAST CT ANGIO NECK W OR WO CONTRAST 05/12/2019 IMPRESSION: 1. 65% stenosis of the right internal carotid artery with mixed calcified and noncalcified plaque. 2. Moderate distal right M1 segment stenosis. 3. Decreased conspicuity of right MCA branch vessels compared to the left, suggesting decreased perfusion. 4. Occlusion of the proximal left posterior cerebral artery with prominent collaterals. This may be a chronic occlusion. 5. High-grade stenosis of the proximal right P2 segment with significant collaterals. 6. Additional atherosclerotic changes at the aortic arch left carotid bifurcation, vertebral artery origins, and bilateral cavernous internal carotid arteries without other significant stenosis. Aortic Atherosclerosis (ICD10-I70.0).  MR BRAIN WO CONTRAST 05/15/2019 IMPRESSION: 1. Moderate-sized late subacute appearing left PCA territory infarct as above. No associated hemorrhage or mass effect. 2. Underlying temporal lobe predominant cerebral atrophy with moderate chronic microvascular ischemic  disease.  ECHOCARDIOGRAM 05/13/2019 IMPRESSIONS  1. Left ventricular ejection fraction, by visual estimation, is 65 to 70%. The left ventricle has normal function. Left ventricular septal wall thickness was moderately increased. Moderately increased left ventricular posterior wall thickness. There is  no left ventricular hypertrophy.  2. Left ventricular diastolic parameters are consistent with Grade I diastolic dysfunction (impaired relaxation).  3. Global right ventricle has normal systolic function.The right ventricular size is normal. No increase in right ventricular wall thickness.  4. Left atrial size was normal.  5. Right atrial size was normal.  6. Severe aortic valve annular calcification.  7. The mitral valve is normal in structure. No evidence of mitral valve regurgitation. No evidence of mitral stenosis.  8. The tricuspid valve is normal in structure. Tricuspid valve regurgitation is not demonstrate  9. The aortic valve is tricuspid. There is Moderate thickening of the aortic valve. There is Severe calcifcation of the aortic valve. Aortic valve regurgitation is mild to moderate. Mild to moderate aortic valve stenosis. Aortic regurgitation PHT  measures 365 msec. Aortic valve mean gradient measures 14.2 mmHg. Aortic valve peak gradient measures 25.2 mmHg. Aortic valve area, by VTI measures 1.36 cm. 10. The pulmonic valve was normal in structure. Pulmonic valve regurgitation is trivial. 11. The inferior vena cava is normal in size with greater than 50% respiratory variability, suggesting right atrial pressure of 3 mmHg.    ASSESSMENT: Maria Clarke is a 85 y.o. year old female presented with transient slurred speech, confusion and LLE weakness on 05/12/2019 with full stroke work-up completed which was unremarkable.  She returned on 05/14/2019 due to worsening confusion, LLE weakness and new right visual field defect with stroke work-up showing left PCA and thalamic infarcts secondary to  chronic left PCA occlusion in setting of hypotension.  Also recommended 30-day cardiac event monitor as it was felt A. fib cannot be completely ruled out.  Vascular risk factors include atrial tachycardia, asymptomatic right ICA stenosis, chronic left PCA occlusion, cognitive impairment, HLD and HTN.  Residual deficits of worsening cognition from baseline and possible visual impairment    PLAN:  1. Left  PCA and thalamic stroke: Continue aspirin 325 mg daily and clopidogrel 75 mg daily  and atorvastatin 80 mg daily for secondary stroke prevention.  Advised to continue DAPT for additional 2 months and then continue aspirin alone.  60-day Plavix refill provided.  Provided atorvastatin refill but advised ongoing refills will be by PCP.  Maintain strict control of hypertension with blood pressure goal 130-150, diabetes with hemoglobin A1c goal below 6.5% and cholesterol with LDL cholesterol (bad cholesterol) goal below 70 mg/dL.  I also advised the patient to eat a healthy diet with plenty of whole grains, cereals, fruits and vegetables, exercise regularly with at least 30 minutes of continuous activity daily and maintain ideal body weight. 2. Carotid stenosis and left PCA occlusion: Ongoing follow-up with [facility] for asymptomatic right ICA stenosis.  Complete 3 months DAPT and continuation of statin for symptomatic left PCA occlusion and ongoing follow-up with [facility] for ongoing monitoring 3. HTN: Advised to continue current treatment regimen.  Today's BP stable.  Advised to continue to monitor at home along with continued follow-up with PCP for management 4. HLD: Advised to continue current treatment regimen along with continued follow-up with PCP for future prescribing and monitoring of lipid panel 5. Cognitive impairment: Worsening secondary to recent stroke with likely mix of vascular and Alzheimer's type dementia.  Cognitive impairment likely contributing to worsening anxiety and decreased appetite.  Initiated  mirtazapine 7.5 mg nightly to help with worsening anxiety and to stimulate appetite.  Reviewed potential risk factors with son and additional information provided.  Highly recommended initiating therapies which can help improve residual deficits and to ensure safety in home environment 6. ?  Visual impairment: Recommend follow-up with ophthalmology for further evaluation due to difficulty evaluating with office examination due to cognitive impairment    Follow up in 3 months or call earlier if needed   Greater than 50% of time during this 45 minute visit was spent on counseling, explanation of diagnosis of left PCA and thalamic stroke, reviewing risk factor management of chronic left PCA occlusion, carotid stenosis, HTN and HLD, planning of further management along with potential future management, and discussion with patient and family answering all questions.    Frann Rider, AGNP-BC  [facility] [address] [place], [place] [address]  Phone [phone] Fax [phone] Note: This document was prepared with digital dictation and possible smart phrase technology. Any transcriptional errors that result from this process are unintentional.

## 2019-06-09 NOTE — Patient Instructions (Addendum)
Recommend initiating home health therapies  Recommend initiating mirtazapine 7.5 mg nightly which can help with anxiety, poor appetite and sleep difficulty Additional information provided below  Continue aspirin 325 mg daily and clopidogrel 75 mg daily  and Lipitor 80 mg daily for secondary stroke prevention  Continue Plavix for additional 2 months and then discontinue and continue on aspirin alone  Continue to follow up with PCP regarding cholesterol and blood pressure management -refill placed today for her Lipitor but request ongoing refills by PCP  Continue to follow with vascular surgery as scheduled  Continue to monitor blood pressure at home  Maintain strict control of hypertension with blood pressure goal below 130/90, diabetes with hemoglobin A1c goal below 6.5% and cholesterol with LDL cholesterol (bad cholesterol) goal below 70 mg/dL. I also advised the patient to eat a healthy diet with plenty of whole grains, cereals, fruits and vegetables, exercise regularly and maintain ideal body weight.  Followup in the future with me in 3 months or call earlier if needed       Thank you for coming to see [place] at [facility]. I hope we have been able to provide you high quality care today.  You may receive a patient satisfaction survey over the next few weeks. We would appreciate your feedback and comments so that we may continue to improve ourselves and the health of our patients.     Mirtazapine tablets What is this medicine? MIRTAZAPINE (mir TAZ a peen) is used to treat depression. This medicine may be used for other purposes; ask your health care provider or pharmacist if you have questions. COMMON BRAND NAME(S): Remeron What should I tell my health care provider before I take this medicine? They need to know if you have any of these conditions:  bipolar disorder  glaucoma  kidney disease  liver disease  suicidal thoughts  an unusual or allergic  reaction to mirtazapine, other medicines, foods, dyes, or preservatives  pregnant or trying to get pregnant  breast-feeding How should I use this medicine? Take this medicine by mouth with a glass of water. Follow the directions on the prescription label. Take your medicine at regular intervals. Do not take your medicine more often than directed. Do not stop taking this medicine suddenly except upon the advice of your doctor. Stopping this medicine too quickly may cause serious side effects or your condition may worsen. A special MedGuide will be given to you by the pharmacist with each prescription and refill. Be sure to read this information carefully each time. Talk to your pediatrician regarding the use of this medicine in children. Special care may be needed. Overdosage: If you think you have taken too much of this medicine contact a poison control center or emergency room at once. NOTE: This medicine is only for you. Do not share this medicine with others. What if I miss a dose? If you miss a dose, take it as soon as you can. If it is almost time for your next dose, take only that dose. Do not take double or extra doses. What may interact with this medicine? Do not take this medicine with any of the following medications:  linezolid  MAOIs like Carbex, Eldepryl, Marplan, Nardil, and Parnate  methylene blue (injected into a vein) This medicine may also interact with the following medications:  alcohol  antiviral medicines for HIV or AIDS  certain medicines that treat or prevent blood clots like warfarin  certain medicines for depression, anxiety, or psychotic disturbances  certain medicines for fungal infections like ketoconazole and itraconazole  certain medicines for migraine headache like almotriptan, eletriptan, frovatriptan, naratriptan, rizatriptan, sumatriptan, zolmitriptan  certain medicines for seizures like carbamazepine or phenytoin  certain medicines for  sleep  cimetidine  erythromycin  fentanyl  lithium  medicines for blood pressure  nefazodone  rasagiline  rifampin  supplements like St. John's wort, kava kava, valerian  tramadol  tryptophan This list may not describe all possible interactions. Give your health care provider a list of all the medicines, herbs, non-prescription drugs, or dietary supplements you use. Also tell them if you smoke, drink alcohol, or use illegal drugs. Some items may interact with your medicine. What should I watch for while using this medicine? Tell your doctor if your symptoms do not get better or if they get worse. Visit your doctor or health care professional for regular checks on your progress. Because it may take several weeks to see the full effects of this medicine, it is important to continue your treatment as prescribed by your doctor. Patients and their families should watch out for new or worsening thoughts of suicide or depression. Also watch out for sudden changes in feelings such as feeling anxious, agitated, panicky, irritable, hostile, aggressive, impulsive, severely restless, overly excited and hyperactive, or not being able to sleep. If this happens, especially at the beginning of treatment or after a change in dose, call your health care professional. Dennis Bast may get drowsy or dizzy. Do not drive, use machinery, or do anything that needs mental alertness until you know how this medicine affects you. Do not stand or sit up quickly, especially if you are an older patient. This reduces the risk of dizzy or fainting spells. Alcohol may interfere with the effect of this medicine. Avoid alcoholic drinks. This medicine may cause dry eyes and blurred vision. If you wear contact lenses you may feel some discomfort. Lubricating drops may help. See your eye doctor if the problem does not go away or is severe. Your mouth may get dry. Chewing sugarless gum or sucking hard candy, and drinking plenty of  water may help. Contact your doctor if the problem does not go away or is severe. What side effects may I notice from receiving this medicine? Side effects that you should report to your doctor or health care professional as soon as possible:  allergic reactions like skin rash, itching or hives, swelling of the face, lips, or tongue  anxious  changes in vision  chest pain  confusion  elevated mood, decreased need for sleep, racing thoughts, impulsive behavior  eye pain  fast, irregular heartbeat  feeling faint or lightheaded, falls  feeling agitated, angry, or irritable  fever or chills, sore throat  hallucination, loss of contact with reality  loss of balance or coordination  mouth sores  redness, blistering, peeling or loosening of the skin, including inside the mouth  restlessness, pacing, inability to keep still  seizures  stiff muscles  suicidal thoughts or other mood changes  trouble passing urine or change in the amount of urine  trouble sleeping  unusual bleeding or bruising  unusually weak or tired  vomiting Side effects that usually do not require medical attention (report to your doctor or health care professional if they continue or are bothersome):  change in appetite  constipation  dizziness  dry mouth  muscle aches or pains  nausea  tired  weight gain This list may not describe all possible side effects. Call your doctor for medical  advice about side effects. You may report side effects to FDA at 1-800-FDA-1088. Where should I keep my medicine? Keep out of the reach of children. Store at room temperature between 15 and 30 degrees C (59 and 86 degrees F) Protect from light and moisture. Throw away any unused medicine after the expiration date. NOTE: This sheet is a summary. It may not cover all possible information. If you have questions about this medicine, talk to your doctor, pharmacist, or health care provider.  2020  Elsevier/Gold Standard (2015-11-24 17:30:45)

## 2019-06-10 NOTE — Progress Notes (Signed)
I agree with the above plan 

## 2019-06-15 ENCOUNTER — Encounter: Payer: Self-pay | Admitting: Surgery

## 2019-06-15 ENCOUNTER — Other Ambulatory Visit: Payer: Self-pay

## 2019-06-15 ENCOUNTER — Ambulatory Visit (INDEPENDENT_AMBULATORY_CARE_PROVIDER_SITE_OTHER): Payer: Medicare Other | Admitting: Surgery

## 2019-06-15 VITALS — BP 128/72 | HR 66 | Temp 98.0°F | Resp 20 | Ht 63.0 in | Wt 127.0 lb

## 2019-06-15 DIAGNOSIS — I6523 Occlusion and stenosis of bilateral carotid arteries: Secondary | ICD-10-CM | POA: Diagnosis not present

## 2019-06-15 NOTE — Progress Notes (Signed)
[facility] [place]  Patient name: Maria Clarke MRN: [id number] DOB: 09/24/1921 Sex: female   REASON FOR VISIT:    Follow-up carotid stenosis  HISOTRY OF PRESENT ILLNESS:    Maria Clarke is a 85 y.o. female with history of carotid stenosis which was initially detected by bruit.  She was last seen in our office in March 2020.  She is without symptoms and ultrasound imaging revealed a 40 to 59% right-sided stenosis and 1 to 39% left-sided stenosis.  In November 2020 she developed a left PCA and thalamic infarcts due to chronic left PCA occlusion in the setting of hypotension.  CT angiogram revealed a 65% right carotid stenosis and chronic left PCA occlusion   PAST MEDICAL HISTORY:   Past Medical History:  Diagnosis Date  . Atrial tachycardia ([place])    a. Noted 02/2013 on EKG.  . Carotid artery disease ([place])    Carotid duplex showed [id number] RICA and [id number] LICA  . Depression   . Hypercholesteremia   . Hypertension   . Hypothyroidism   . Stroke [facility])      FAMILY HISTORY:   Family History  Problem Relation Age of Onset  . Diabetes Mother   . Heart disease Mother        After age 31  . Hyperlipidemia Mother   . Hypertension Mother   . Heart disease Sister        After age 54  . Asthma Sister   . Hyperlipidemia Sister   . Heart disease Brother        After age 87  . Diabetes Son   . Hypertension Son     SOCIAL HISTORY:   Social History   Tobacco Use  . Smoking status: Never Smoker  . Smokeless tobacco: Never Used  Substance Use Topics  . Alcohol use: No     ALLERGIES:   Allergies  Allergen Reactions  . Sulfonamide Derivatives Other (See Comments)    "ORAL S.E." (family unaware what time means)     CURRENT MEDICATIONS:   Current Outpatient Medications  Medication Sig Dispense Refill  . aspirin 325 MG tablet Take 1 tablet (325 mg total) by mouth daily. 30 tablet 0  . atorvastatin (LIPITOR)  80 MG tablet Take 1 tablet (80 mg total) by mouth daily at 6 PM. 90 tablet 3  . clopidogrel (PLAVIX) 75 MG tablet Take 1 tablet (75 mg total) by mouth daily. 60 tablet 0  . cyanocobalamin (,VITAMIN B-12,) 1000 MCG/ML injection Inject 1 mL (1,000 mcg total) into the skin every 30 (thirty) days. 3 mL 1  . donepezil (ARICEPT) 5 MG tablet Take 1 tablet (5 mg total) by mouth at bedtime. 90 tablet 4  . escitalopram (LEXAPRO) 5 MG tablet Take 1 tablet (5 mg total) by mouth at bedtime. 90 tablet 1  . levothyroxine (SYNTHROID) 88 MCG tablet Take 1 tablet (88 mcg total) by mouth daily before breakfast. 30 tablet 0  . metoprolol tartrate (LOPRESSOR) 50 MG tablet TAKE 1 TABLET BY MOUTH TWICE A DAY (Patient taking differently: Take 50 mg by mouth 2 (two) times daily. TAKE 1 TABLET BY MOUTH TWICE A DAY) 180 tablet 1  . mirtazapine (REMERON) 7.5 MG tablet Take 1 tablet (7.5 mg total) by mouth at bedtime. 30 tablet 3   No current facility-administered medications for this visit.     REVIEW OF SYSTEMS:   [X]  denotes positive finding, [ ]  denotes negative finding Cardiac  Comments:  Chest  pain or chest pressure:    Shortness of breath upon exertion:    Short of breath when lying flat:    Irregular heart rhythm:        Vascular    Pain in calf, thigh, or hip brought on by ambulation: x   Pain in feet at night that wakes you up from your sleep:     Blood clot in your veins:    Leg swelling:  x       Pulmonary    Oxygen at home:    Productive cough:     Wheezing:         Neurologic    Sudden weakness in arms or legs:     Sudden numbness in arms or legs:     Sudden onset of difficulty speaking or slurred speech:    Temporary loss of vision in one eye:     Problems with dizziness:         Gastrointestinal    Blood in stool:     Vomited blood:         Genitourinary    Burning when urinating:     Blood in urine:        Psychiatric    Major depression:         Hematologic    Bleeding  problems:    Problems with blood clotting too easily:        Skin    Rashes or ulcers:        Constitutional    Fever or chills:      PHYSICAL EXAM:   Vitals:   06/15/19 0928 06/15/19 0931  BP: 119/69 128/72  Pulse: 66   Resp: 20   Temp: 98 F (36.7 C)   SpO2: 98%   Weight: 127 lb (57.6 kg)   Height: 5\' 3"  (1.6 m)     GENERAL: The patient is a well-nourished female, in no acute distress. The vital signs are documented above. CARDIAC: There is a regular rate and rhythm.  PULMONARY: Non-labored respirations ABDOMEN: Soft and non-tender with normal pitched bowel sounds.  MUSCULOSKELETAL: There are no major deformities or cyanosis. NEUROLOGIC: Steady gait.  Somewhat agitated. SKIN: There are no ulcers or rashes noted. PSYCHIATRIC: The patient has a normal affect.  STUDIES:   I have reviewed her CT angiogram of the neck with the following findings: 1. 65% stenosis of the right internal carotid artery with mixed calcified and noncalcified plaque. 2. Moderate distal right M1 segment stenosis. 3. Decreased conspicuity of right MCA branch vessels compared to the left, suggesting decreased perfusion. 4. Occlusion of the proximal left posterior cerebral artery with prominent collaterals. This may be a chronic occlusion. 5. High-grade stenosis of the proximal right P2 segment with significant collaterals. 6. Additional atherosclerotic changes at the aortic arch left carotid bifurcation, vertebral artery origins, and bilateral cavernous internal carotid arteries without other significant stenosis. Aortic Atherosclerosis (ICD10-I70.0).  MEDICAL ISSUES:   The patient had a recent left brain stroke.  Her left extracranial carotid artery is widely patent.  The stenosis on her right side has progressed to 65% by CT scan.  No surgical intervention is recommended at this time.  This was discussed with the patient and family.  She will follow-up in 1 year with repeat carotid studies.     Leia Alf, MD, FACS [facility] [phone] Pager [phone]

## 2019-06-18 ENCOUNTER — Inpatient Hospital Stay (HOSPITAL_COMMUNITY)
Admission: EM | Admit: 2019-06-18 | Discharge: 2019-07-03 | DRG: 378 | Disposition: A | Discharge status: Home Health | Payer: Medicare Other | Attending: Internal Medicine | Admitting: Internal Medicine

## 2019-06-18 ENCOUNTER — Encounter (HOSPITAL_COMMUNITY): Payer: Self-pay | Admitting: Internal Medicine

## 2019-06-18 ENCOUNTER — Other Ambulatory Visit: Payer: Self-pay | Admitting: *Deleted

## 2019-06-18 ENCOUNTER — Emergency Department (HOSPITAL_COMMUNITY): Payer: Medicare Other

## 2019-06-18 DIAGNOSIS — Z7982 Long term (current) use of aspirin: Secondary | ICD-10-CM

## 2019-06-18 DIAGNOSIS — R4189 Other symptoms and signs involving cognitive functions and awareness: Secondary | ICD-10-CM | POA: Diagnosis not present

## 2019-06-18 DIAGNOSIS — W19XXXA Unspecified fall, initial encounter: Secondary | ICD-10-CM | POA: Diagnosis not present

## 2019-06-18 DIAGNOSIS — Z8249 Family history of ischemic heart disease and other diseases of the circulatory system: Secondary | ICD-10-CM

## 2019-06-18 DIAGNOSIS — F329 Major depressive disorder, single episode, unspecified: Secondary | ICD-10-CM | POA: Diagnosis present

## 2019-06-18 DIAGNOSIS — Z20828 Contact with and (suspected) exposure to other viral communicable diseases: Secondary | ICD-10-CM | POA: Diagnosis not present

## 2019-06-18 DIAGNOSIS — E039 Hypothyroidism, unspecified: Secondary | ICD-10-CM | POA: Diagnosis not present

## 2019-06-18 DIAGNOSIS — W1830XA Fall on same level, unspecified, initial encounter: Secondary | ICD-10-CM | POA: Diagnosis present

## 2019-06-18 DIAGNOSIS — Z882 Allergy status to sulfonamides status: Secondary | ICD-10-CM

## 2019-06-18 DIAGNOSIS — R451 Restlessness and agitation: Secondary | ICD-10-CM | POA: Diagnosis not present

## 2019-06-18 DIAGNOSIS — Z515 Encounter for palliative care: Secondary | ICD-10-CM | POA: Diagnosis not present

## 2019-06-18 DIAGNOSIS — K2950 Unspecified chronic gastritis without bleeding: Secondary | ICD-10-CM | POA: Diagnosis present

## 2019-06-18 DIAGNOSIS — K449 Diaphragmatic hernia without obstruction or gangrene: Secondary | ICD-10-CM | POA: Diagnosis present

## 2019-06-18 DIAGNOSIS — Z8673 Personal history of transient ischemic attack (TIA), and cerebral infarction without residual deficits: Secondary | ICD-10-CM

## 2019-06-18 DIAGNOSIS — K920 Hematemesis: Secondary | ICD-10-CM | POA: Diagnosis not present

## 2019-06-18 DIAGNOSIS — Z8349 Family history of other endocrine, nutritional and metabolic diseases: Secondary | ICD-10-CM | POA: Diagnosis not present

## 2019-06-18 DIAGNOSIS — Z79890 Hormone replacement therapy (postmenopausal): Secondary | ICD-10-CM

## 2019-06-18 DIAGNOSIS — I2510 Atherosclerotic heart disease of native coronary artery without angina pectoris: Secondary | ICD-10-CM | POA: Diagnosis present

## 2019-06-18 DIAGNOSIS — D62 Acute posthemorrhagic anemia: Secondary | ICD-10-CM | POA: Diagnosis present

## 2019-06-18 DIAGNOSIS — H918X3 Other specified hearing loss, bilateral: Secondary | ICD-10-CM | POA: Diagnosis present

## 2019-06-18 DIAGNOSIS — I639 Cerebral infarction, unspecified: Secondary | ICD-10-CM | POA: Diagnosis present

## 2019-06-18 DIAGNOSIS — F05 Delirium due to known physiological condition: Secondary | ICD-10-CM | POA: Diagnosis not present

## 2019-06-18 DIAGNOSIS — Z79899 Other long term (current) drug therapy: Secondary | ICD-10-CM | POA: Diagnosis not present

## 2019-06-18 DIAGNOSIS — K250 Acute gastric ulcer with hemorrhage: Secondary | ICD-10-CM | POA: Diagnosis not present

## 2019-06-18 DIAGNOSIS — K259 Gastric ulcer, unspecified as acute or chronic, without hemorrhage or perforation: Secondary | ICD-10-CM | POA: Diagnosis not present

## 2019-06-18 DIAGNOSIS — K922 Gastrointestinal hemorrhage, unspecified: Secondary | ICD-10-CM | POA: Diagnosis present

## 2019-06-18 DIAGNOSIS — F0390 Unspecified dementia without behavioral disturbance: Secondary | ICD-10-CM | POA: Diagnosis not present

## 2019-06-18 DIAGNOSIS — I959 Hypotension, unspecified: Secondary | ICD-10-CM | POA: Diagnosis present

## 2019-06-18 DIAGNOSIS — Z66 Do not resuscitate: Secondary | ICD-10-CM | POA: Diagnosis not present

## 2019-06-18 DIAGNOSIS — D649 Anemia, unspecified: Secondary | ICD-10-CM

## 2019-06-18 DIAGNOSIS — Z7189 Other specified counseling: Secondary | ICD-10-CM | POA: Diagnosis not present

## 2019-06-18 DIAGNOSIS — I10 Essential (primary) hypertension: Secondary | ICD-10-CM | POA: Diagnosis present

## 2019-06-18 DIAGNOSIS — I63532 Cerebral infarction due to unspecified occlusion or stenosis of left posterior cerebral artery: Secondary | ICD-10-CM | POA: Diagnosis not present

## 2019-06-18 DIAGNOSIS — Z7902 Long term (current) use of antithrombotics/antiplatelets: Secondary | ICD-10-CM

## 2019-06-18 DIAGNOSIS — E876 Hypokalemia: Secondary | ICD-10-CM | POA: Diagnosis present

## 2019-06-18 DIAGNOSIS — R9431 Abnormal electrocardiogram [ECG] [EKG]: Secondary | ICD-10-CM | POA: Diagnosis not present

## 2019-06-18 DIAGNOSIS — K2900 Acute gastritis without bleeding: Secondary | ICD-10-CM | POA: Diagnosis not present

## 2019-06-18 LAB — ABO/RH: ABO/RH(D): O POS

## 2019-06-18 LAB — CBC
HCT: 24.8 % — ABNORMAL LOW (ref 36.0–46.0)
Hemoglobin: 7.9 g/dL — ABNORMAL LOW (ref 12.0–15.0)
MCH: 29.9 pg (ref 26.0–34.0)
MCHC: 31.9 g/dL (ref 30.0–36.0)
MCV: 93.9 fL (ref 80.0–100.0)
Platelets: 130 10*3/uL — ABNORMAL LOW (ref 150–400)
RBC: 2.64 MIL/uL — ABNORMAL LOW (ref 3.87–5.11)
RDW: 14.3 % (ref 11.5–15.5)
WBC: 6.5 10*3/uL (ref 4.0–10.5)
nRBC: 0 % (ref 0.0–0.2)

## 2019-06-18 LAB — CBC WITH DIFFERENTIAL/PLATELET
Abs Immature Granulocytes: 0.03 10*3/uL (ref 0.00–0.07)
Basophils Absolute: 0 10*3/uL (ref 0.0–0.1)
Basophils Relative: 0 %
Eosinophils Absolute: 0.3 10*3/uL (ref 0.0–0.5)
Eosinophils Relative: 3 %
HCT: 28 % — ABNORMAL LOW (ref 36.0–46.0)
Hemoglobin: 8.9 g/dL — ABNORMAL LOW (ref 12.0–15.0)
Immature Granulocytes: 0 %
Lymphocytes Relative: 16 %
Lymphs Abs: 1.5 10*3/uL (ref 0.7–4.0)
MCH: 29.9 pg (ref 26.0–34.0)
MCHC: 31.8 g/dL (ref 30.0–36.0)
MCV: 94 fL (ref 80.0–100.0)
Monocytes Absolute: 0.8 10*3/uL (ref 0.1–1.0)
Monocytes Relative: 9 %
Neutro Abs: 6.5 10*3/uL (ref 1.7–7.7)
Neutrophils Relative %: 72 %
Platelets: 162 10*3/uL (ref 150–400)
RBC: 2.98 MIL/uL — ABNORMAL LOW (ref 3.87–5.11)
RDW: 14.5 % (ref 11.5–15.5)
WBC: 9 10*3/uL (ref 4.0–10.5)
nRBC: 0 % (ref 0.0–0.2)

## 2019-06-18 LAB — COMPREHENSIVE METABOLIC PANEL
ALT: 18 U/L (ref 0–44)
AST: 32 U/L (ref 15–41)
Albumin: 2.7 g/dL — ABNORMAL LOW (ref 3.5–5.0)
Alkaline Phosphatase: 59 U/L (ref 38–126)
Anion gap: 9 (ref 5–15)
BUN: 13 mg/dL (ref 8–23)
CO2: 24 mmol/L (ref 22–32)
Calcium: 8.3 mg/dL — ABNORMAL LOW (ref 8.9–10.3)
Chloride: 110 mmol/L (ref 98–111)
Creatinine, Ser: 0.94 mg/dL (ref 0.44–1.00)
GFR calc Af Amer: 60 mL/min — ABNORMAL LOW (ref >60)
GFR calc non Af Amer: 52 mL/min — ABNORMAL LOW (ref >60)
Glucose, Bld: 142 mg/dL — ABNORMAL HIGH (ref 70–99)
Potassium: 3.3 mmol/L — ABNORMAL LOW (ref 3.5–5.1)
Sodium: 143 mmol/L (ref 135–145)
Total Bilirubin: 1.2 mg/dL (ref 0.3–1.2)
Total Protein: 5 g/dL — ABNORMAL LOW (ref 6.5–8.1)

## 2019-06-18 LAB — PREPARE RBC (CROSSMATCH)

## 2019-06-18 LAB — PROTIME-INR
INR: 1.2 (ref 0.8–1.2)
Prothrombin Time: 14.6 seconds (ref 11.4–15.2)

## 2019-06-18 LAB — LIPASE, BLOOD: Lipase: 33 U/L (ref 11–51)

## 2019-06-18 LAB — LACTIC ACID, PLASMA: Lactic Acid, Venous: 2.4 mmol/L (ref 0.5–1.9)

## 2019-06-18 LAB — POC OCCULT BLOOD, ED: Fecal Occult Bld: POSITIVE — AB

## 2019-06-18 MED ORDER — ACETAMINOPHEN 650 MG RE SUPP: 650.0000 mg | Freq: Four times a day (QID) | RECTAL | Status: DC | PRN

## 2019-06-18 MED ORDER — SODIUM CHLORIDE 0.9% IV SOLUTION: Freq: Once | INTRAVENOUS | Status: DC

## 2019-06-18 MED ORDER — LORAZEPAM 2 MG/ML IJ SOLN
0.2500 mg | Freq: Once | INTRAMUSCULAR | Status: AC
  Administered 2019-06-18: 0.25 mg via INTRAVENOUS
  Filled 2019-06-18: qty 1

## 2019-06-18 MED ORDER — PANTOPRAZOLE SODIUM 40 MG IV SOLR
40.0000 mg | Freq: Once | INTRAVENOUS | Status: AC
  Administered 2019-06-18: 21:00:00 40 mg via INTRAVENOUS
  Filled 2019-06-18: qty 40

## 2019-06-18 MED ORDER — SODIUM CHLORIDE 0.9 % IV SOLN
INTRAVENOUS | Status: AC
  Administered 2019-06-19 (×3): via INTRAVENOUS

## 2019-06-18 MED ORDER — ONDANSETRON HCL 4 MG PO TABS: 4.0000 mg | ORAL_TABLET | Freq: Four times a day (QID) | ORAL | Status: DC | PRN

## 2019-06-18 MED ORDER — SODIUM CHLORIDE 0.9 % IV BOLUS
250.0000 mL | Freq: Once | INTRAVENOUS | Status: AC
  Administered 2019-06-18: 250 mL via INTRAVENOUS

## 2019-06-18 MED ORDER — SODIUM CHLORIDE 0.9 % IV BOLUS
500.0000 mL | Freq: Once | INTRAVENOUS | Status: AC
  Administered 2019-06-18: 500 mL via INTRAVENOUS

## 2019-06-18 MED ORDER — PANTOPRAZOLE SODIUM 40 MG IV SOLR: 40.0000 mg | Freq: Two times a day (BID) | INTRAVENOUS | Status: DC

## 2019-06-18 MED ORDER — SODIUM CHLORIDE 0.9 % IV SOLN
8.0000 mg/h | INTRAVENOUS | Status: DC
  Administered 2019-06-19: 8 mg/h via INTRAVENOUS
  Filled 2019-06-18 (×2): qty 80

## 2019-06-18 MED ORDER — ONDANSETRON HCL 4 MG/2ML IJ SOLN: 4.0000 mg | Freq: Four times a day (QID) | INTRAMUSCULAR | Status: DC | PRN

## 2019-06-18 MED ORDER — ACETAMINOPHEN 325 MG PO TABS
650.0000 mg | ORAL_TABLET | Freq: Four times a day (QID) | ORAL | Status: DC | PRN
  Administered 2019-06-20 – 2019-06-27 (×4): 650 mg via ORAL
  Filled 2019-06-18 (×4): qty 2

## 2019-06-18 NOTE — ED Provider Notes (Signed)
[facility] EMERGENCY DEPARTMENT Provider Note   CSN: [id number] Arrival date & time: 06/18/19  2018     History Chief Complaint  Patient presents with  . GI Bleeding    Maria Clarke is a 85 y.o. female.  The history is provided by the patient, medical records and a relative (Maria Clarke, the patient's son). No language interpreter was used.  Emesis Severity:  Severe Duration:  2 hours Timing:  Intermittent Quality:  Coffee grounds and bright red blood Progression:  Unchanged Chronicity:  New Recent urination:  Normal Relieved by:  Nothing Worsened by:  Nothing Ineffective treatments:  None tried Associated symptoms: no abdominal pain, no chills, no cough, no diarrhea and no fever   Associated symptoms comment:  Dark stool       LVL 5 caveat for dementia.   Past Medical History:  Diagnosis Date  . Atrial tachycardia ([place])    a. Noted 02/2013 on EKG.  . Carotid artery disease ([place])    Carotid duplex showed [id number] RICA and [id number] LICA  . Depression   . Hypercholesteremia   . Hypertension   . Hypothyroidism   . Stroke [facility])     Patient Active Problem List   Diagnosis Date Noted  . CVA (cerebral vascular accident) ([place]) 05/14/2019  . TIA (transient ischemic attack) 05/12/2019  . Cognitive impairment 10/29/2018  . Shingles outbreak 05/25/2015  . Carotid stenosis 04/20/2014  . Memory loss, short term 08/13/2013  . Occlusion and stenosis of carotid artery without mention of cerebral infarction 04/20/2013  . Carotid bruit 03/05/2013  . Hearing loss of both ears 11/10/2012  . EDEMA- LOCALIZED 12/15/2009  . Hypothyroidism 12/11/2007  . Depression, recurrent ([place]) 12/11/2007  . Essential hypertension 12/11/2007  . HEMATURIA 02/14/2007    Past Surgical History:  Procedure Laterality Date  . BREAST SURGERY Left    Benign Tumor- several years ago  . CESAREAN SECTION    . EYE SURGERY Bilateral    cataract     OB History   No obstetric history  on file.     Family History  Problem Relation Age of Onset  . Diabetes Mother   . Heart disease Mother        After age 55  . Hyperlipidemia Mother   . Hypertension Mother   . Heart disease Sister        After age 40  . Asthma Sister   . Hyperlipidemia Sister   . Heart disease Brother        After age 47  . Diabetes Son   . Hypertension Son     Social History   Tobacco Use  . Smoking status: Never Smoker  . Smokeless tobacco: Never Used  Substance Use Topics  . Alcohol use: No  . Drug use: No    Home Medications Prior to Admission medications   Medication Sig Start Date End Date Taking? Authorizing Provider  aspirin 325 MG tablet Take 1 tablet (325 mg total) by mouth daily. 05/14/19   Mitzi Hansen, MD  atorvastatin (LIPITOR) 80 MG tablet Take 1 tablet (80 mg total) by mouth daily at 6 PM. 06/09/19   Frann Rider, NP  clopidogrel (PLAVIX) 75 MG tablet Take 1 tablet (75 mg total) by mouth daily. 06/09/19 09/07/19  Frann Rider, NP  cyanocobalamin (,VITAMIN B-12,) 1000 MCG/ML injection Inject 1 mL (1,000 mcg total) into the skin every 30 (thirty) days. 12/23/18   Nafziger, Tommi Rumps, NP  donepezil (ARICEPT) 5 MG tablet Take 1  tablet (5 mg total) by mouth at bedtime. 04/18/18   Dorena Cookey, MD  escitalopram (LEXAPRO) 5 MG tablet Take 1 tablet (5 mg total) by mouth at bedtime. 12/23/18   Nafziger, Tommi Rumps, NP  levothyroxine (SYNTHROID) 88 MCG tablet Take 1 tablet (88 mcg total) by mouth daily before breakfast. 05/19/19   Mitzi Hansen, MD  metoprolol tartrate (LOPRESSOR) 50 MG tablet TAKE 1 TABLET BY MOUTH TWICE A DAY Patient taking differently: Take 50 mg by mouth 2 (two) times daily. TAKE 1 TABLET BY MOUTH TWICE A DAY 12/23/18   Nafziger, Tommi Rumps, NP  mirtazapine (REMERON) 7.5 MG tablet Take 1 tablet (7.5 mg total) by mouth at bedtime. 06/09/19   Frann Rider, NP    Allergies    Sulfonamide derivatives  Review of Systems   Review of Systems  Unable to perform ROS:  Dementia  Constitutional: Negative for chills, diaphoresis, fatigue and fever.  Respiratory: Negative for cough.   Gastrointestinal: Positive for blood in stool, nausea and vomiting. Negative for abdominal pain, constipation and diarrhea.  Neurological: Negative for syncope.    Physical Exam Updated Vital Signs BP (!) 113/54 (BP Location: Right Arm)   Pulse 63   Temp (!) 97.1 F (36.2 C) (Axillary)   Resp 18   SpO2 98%   Physical Exam Vitals and nursing note reviewed.  Constitutional:      General: She is not in acute distress.    Appearance: She is well-developed. She is not ill-appearing, toxic-appearing or diaphoretic.  HENT:     Head: Normocephalic and atraumatic.     Comments: Face covered in hematemesis     Right Ear: External ear normal.     Left Ear: External ear normal.     Nose: Nose normal. No congestion.     Mouth/Throat:     Mouth: Mucous membranes are moist.     Pharynx: No oropharyngeal exudate or posterior oropharyngeal erythema.  Eyes:     Conjunctiva/sclera: Conjunctivae normal.     Pupils: Pupils are equal, round, and reactive to light.  Cardiovascular:     Rate and Rhythm: Normal rate.     Pulses: Normal pulses.     Heart sounds: No murmur.  Pulmonary:     Effort: No respiratory distress.     Breath sounds: No stridor. No wheezing, rhonchi or rales.  Chest:     Chest wall: No tenderness.  Abdominal:     General: Abdomen is flat. There is no distension.     Tenderness: There is no abdominal tenderness. There is no right CVA tenderness, left CVA tenderness or rebound.  Genitourinary:    Rectum: Guaiac result positive.  Musculoskeletal:        General: No tenderness.     Cervical back: Normal range of motion and neck supple. No tenderness.     Right lower leg: No edema.     Left lower leg: No edema.  Skin:    General: Skin is warm.     Capillary Refill: Capillary refill takes less than 2 seconds.     Findings: No erythema or rash.   Neurological:     Mental Status: She is alert. Mental status is at baseline.     Motor: No weakness or abnormal muscle tone.     Deep Tendon Reflexes: Reflexes are normal and symmetric.     Comments: Patient is confused but pleasant.  She denies any complaints despite being covered in bloody emesis.     ED Results / Procedures /  Treatments   Labs (all labs ordered are listed, but only abnormal results are displayed) Labs Reviewed  CBC WITH DIFFERENTIAL/PLATELET - Abnormal; Notable for the following components:      Result Value   RBC 2.98 (*)    Hemoglobin 8.9 (*)    HCT 28.0 (*)    All other components within normal limits  COMPREHENSIVE METABOLIC PANEL - Abnormal; Notable for the following components:   Potassium 3.3 (*)    Glucose, Bld 142 (*)    Calcium 8.3 (*)    Total Protein 5.0 (*)    Albumin 2.7 (*)    GFR calc non Af Amer 52 (*)    GFR calc Af Amer 60 (*)    All other components within normal limits  LACTIC ACID, PLASMA - Abnormal; Notable for the following components:   Lactic Acid, Venous 2.4 (*)    All other components within normal limits  CBC - Abnormal; Notable for the following components:   RBC 2.64 (*)    Hemoglobin 7.9 (*)    HCT 24.8 (*)    Platelets 130 (*)    All other components within normal limits  POC OCCULT BLOOD, ED - Abnormal; Notable for the following components:   Fecal Occult Bld POSITIVE (*)    All other components within normal limits  SARS CORONAVIRUS 2 (TAT 6-24 HRS)  LIPASE, BLOOD  PROTIME-INR  LACTIC ACID, PLASMA  BASIC METABOLIC PANEL  CBC  TYPE AND SCREEN  ABO/RH  PREPARE RBC (CROSSMATCH)    EKG None  Radiology CT Head Wo Contrast  Result Date: 06/18/2019 CLINICAL DATA:  Mental status. Headache. Recent stroke. Fall. EXAM: CT HEAD WITHOUT CONTRAST TECHNIQUE: Contiguous axial images were obtained from the base of the skull through the vertex without intravenous contrast. COMPARISON:  Head CT and brain MRI 05/14/2019  FINDINGS: Brain: Left occipital infarct that was late subacute appearing on imaging last month has undergone expected evolution with developing encephalomalacia. No hemorrhagic transformation. No acute hemorrhage. No evidence of new ischemia. Stable degree of generalized atrophy and moderate to advanced chronic small vessel ischemia. Subdural or extra-axial collection. No midline shift, mass effect, or hydrocephalus. Vascular: Atherosclerosis of skullbase vasculature without hyperdense vessel or abnormal calcification. Skull: No fracture or focal lesion. Sinuses/Orbits: Paranasal sinuses and mastoid air cells are clear. The visualized orbits are unremarkable. Bilateral lens extraction. Other: None. IMPRESSION: 1. Expected evolution of left occipital infarct with developing encephalomalacia since imaging last month. 2. No new abnormality. Stable atrophy and moderate to advanced chronic small vessel ischemia. Electronically Signed   By: Keith Rake M.D.   On: 06/18/2019 22:08    Procedures Procedures (including critical care time)  Medications Ordered in ED Medications  0.9 %  sodium chloride infusion (Manually program via Guardrails IV Fluids) (has no administration in time range)  acetaminophen (TYLENOL) tablet 650 mg (has no administration in time range)    Or  acetaminophen (TYLENOL) suppository 650 mg (has no administration in time range)  ondansetron (ZOFRAN) tablet 4 mg (has no administration in time range)    Or  ondansetron (ZOFRAN) injection 4 mg (has no administration in time range)  0.9 %  sodium chloride infusion (has no administration in time range)  pantoprazole (PROTONIX) 80 mg in sodium chloride 0.9 % 250 mL (0.32 mg/mL) infusion (has no administration in time range)  pantoprazole (PROTONIX) injection 40 mg (has no administration in time range)  pantoprazole (PROTONIX) injection 40 mg (40 mg Intravenous Given 06/18/19 2103)  LORazepam (  ATIVAN) injection 0.25 mg (0.25 mg  Intravenous Given 06/18/19 2125)  sodium chloride 0.9 % bolus 500 mL (0 mLs Intravenous Stopped 06/18/19 2244)  sodium chloride 0.9 % bolus 250 mL (0 mLs Intravenous Stopped 06/18/19 2303)      ED Course  I have reviewed the triage vital signs and the nursing notes.  Pertinent labs & imaging results that were available during my care of the patient were reviewed by me and considered in my medical decision making (see chart for details).      CRITICAL CARE Performed by: Gwenyth Allegra Zackory Pudlo Total critical care time: 45 minutes Critical care time was exclusive of separately billable procedures and treating other patients. Critical care was necessary to treat or prevent imminent or life-threatening deterioration. Critical care was time spent personally by me on the following activities: development of treatment plan with patient and/or surrogate as well as nursing, discussions with consultants, evaluation of patient's response to treatment, examination of patient, obtaining history from patient or surrogate, ordering and performing treatments and interventions, ordering and review of laboratory studies, ordering and review of radiographic studies, pulse oximetry and re-evaluation of patient's condition.   MDM Rules/Calculators/A&P                         SAWYER HARGADON is a 85 y.o. female with a past medical history significant for dementia, CAD, hypertension, hypothyroidism, and recent stroke on aspirin and Plavix who presents with hematemesis and a fall.  According to patient's son, patient has had some burping and nausea for the last few days but is otherwise been acting normally.  Patient went to the bathroom this evening and when they went to check on her, she was in the process of vomiting blood and falling.  Patient was caught but did hit her head.  She denied loss of consciousness but reports mild headache where she has a knot on the top of her head.  She continued to vomit dark  blood and had dark stool.  She denies any chest pain or abdominal pain.  Patient has dementia and does not remember any of the episodes.  On exam, patient is covered in dark blood from her emesis.  Lungs clear and chest is nontender.  Abdomen is nontender.  Fecal occult test was positive.  Patient moving all extremities.  Patient is disoriented and confused.  Patient's son, Maria Clarke, arrived and reports her mental status is at his baseline.  Clinically I am concerned with upper GI bleed.  As patient is on aspirin and Plavix from her recent stroke, I suspect that this led to her having the upper GI bleed.  Vital signs showed she was having blood pressures in the 90s.  EMS reported blood pressure in the 90s and gave normal saline in route.  Patient was given a small amount of normal saline again for soft pressures but she did not drop below 90 initially.  Patient is not tachycardic.  Patient had type and screen sent as well as other labs.  CT head obtained given the recent stroke and fall with a knot on her scalp.  CT head did not show bleeding.  Patient's hemoglobin was 8.9.  This is down from 14 previously.  As patient is not hypotensive, will hold on blood.  Patient had a repeat hemoglobin when her blood pressure was again in the 90s even dropped to 88 systolic.  Hemoglobin dropped to 7.9.  Patient will be given 1 unit  of blood for hypotension in the 80s and active GI bleeding.  Patient was given IV Protonix and made NPO.  GI was called who will see patient in morning.  Hospitalist team will admit for GI bleed and dropping hemoglobin requiring transfusion.  Do not feel she needs ICU at this time however will monitor her blood pressures after transfusion.  Patient's son, Maria Clarke, was called who consented for blood transfusion.  He is aware of the plan and patient admission.    Final Clinical Impression(s) / ED Diagnoses Final diagnoses:  Acute GI bleeding  Upper GI bleed  Hematemesis, presence of nausea  not specified  Fall, initial encounter  Symptomatic anemia    Rx / DC Orders ED Discharge Orders    None     Clinical Impression: 1. Acute GI bleeding   2. Upper GI bleed   3. Hematemesis, presence of nausea not specified   4. Fall, initial encounter   5. Symptomatic anemia     Disposition: Admit  This note was prepared with assistance of Dragon voice recognition software. Occasional wrong-word or sound-a-like substitutions may have occurred due to the inherent limitations of voice recognition software.     Donyea Beverlin, Gwenyth Allegra, MD 06/18/19 [phone]

## 2019-06-18 NOTE — ED Triage Notes (Signed)
Pt came in form [facility] form home with c/o of vomiting blood. Her son caught her from falling post vomiting. When EMS arrived there was approx 2106mL of blood found on the floor. Pt has a hx of dementia and also had a stroke 2 weeks ago that lef there with increased AMS and Left eye vision changes. Pt is on Plavix.   18GIV LAC 338ml NS Given in route. Initial BP form EMS was 92/70. Increased to 130/90. HR65. 98%RA, 176CBG

## 2019-06-18 NOTE — H&P (Signed)
History and Physical    Maria Clarke [id number] DOB: 1922-05-07 DOA: 06/18/2019  PCP: [name], NP  Patient coming from: Home.  Chief Complaint: Vomiting blood.  HPI: Maria Clarke is a 85 y.o. female with history of recent stroke on antiplatelet agents, hypertension, dementia, CAD, hypothyroidism was brought to the ER patient's son found that patient has been throwing up blood since this evening.  Over the last 2 days patient was noticed to have some nausea and burping.  This evening when she went to the bathroom she was found to be having hematemesis and almost fell onto the floor but patient son caught her but did hit her head.  Did not lose consciousness.  Following which patient had another episode of throwing up blood.  Also had some black stools.  EMS was called and patient was found to be hypotensive.  ED Course: In the ER patient was hypotensive and has to be given fluid bolus.  Hemoglobin dropped by 6 g of previous of 14 and is presently around 8.  Stool for occult blood was positive.  Covid test was negative.  Patient's blood pressure improved with fluids.  Started on Protonix infusion.  On-call gastroenterologist Dr. Paulita Fujita was consulted.  Since patient again became hypotensive 1 unit of PRBC was ordered.  CT head is unremarkable except for the expected evolution changes of recent stroke.  Review of Systems: As per HPI, rest all negative.   Past Medical History:  Diagnosis Date  . Atrial tachycardia ([place])    a. Noted 02/2013 on EKG.  . Carotid artery disease ([place])    Carotid duplex showed [id number] RICA and [id number] LICA  . Depression   . Hypercholesteremia   . Hypertension   . Hypothyroidism   . Stroke [facility])     Past Surgical History:  Procedure Laterality Date  . BREAST SURGERY Left    Benign Tumor- several years ago  . CESAREAN SECTION    . EYE SURGERY Bilateral    cataract     reports that she has never smoked. She has never used smokeless tobacco. She  reports that she does not drink alcohol or use drugs.  Allergies  Allergen Reactions  . Sulfonamide Derivatives Other (See Comments)    "ORAL S.E." (family unaware what time means)    Family History  Problem Relation Age of Onset  . Diabetes Mother   . Heart disease Mother        After age 32  . Hyperlipidemia Mother   . Hypertension Mother   . Heart disease Sister        After age 19  . Asthma Sister   . Hyperlipidemia Sister   . Heart disease Brother        After age 71  . Diabetes Son   . Hypertension Son     Prior to Admission medications   Medication Sig Start Date End Date Taking? Authorizing Provider  aspirin 325 MG tablet Take 1 tablet (325 mg total) by mouth daily. 05/14/19  Yes Christian, Rylee, MD  atorvastatin (LIPITOR) 80 MG tablet Take 1 tablet (80 mg total) by mouth daily at 6 PM. 06/09/19  Yes McCue, Janett Billow, NP  clopidogrel (PLAVIX) 75 MG tablet Take 1 tablet (75 mg total) by mouth daily. 06/09/19 09/07/19 Yes McCue, Janett Billow, NP  cyanocobalamin (,VITAMIN B-12,) 1000 MCG/ML injection Inject 1 mL (1,000 mcg total) into the skin every 30 (thirty) days. 12/23/18  Yes Nafziger, Tommi Rumps, NP  donepezil (ARICEPT) 5 MG  tablet Take 1 tablet (5 mg total) by mouth at bedtime. 04/18/18  Yes Dorena Cookey, MD  escitalopram (LEXAPRO) 5 MG tablet Take 1 tablet (5 mg total) by mouth at bedtime. 12/23/18  Yes Nafziger, Tommi Rumps, NP  levothyroxine (SYNTHROID) 88 MCG tablet Take 1 tablet (88 mcg total) by mouth daily before breakfast. Patient taking differently: Take 100 mcg by mouth daily before breakfast.  05/19/19  Yes Christian, Rylee, MD  metoprolol tartrate (LOPRESSOR) 50 MG tablet TAKE 1 TABLET BY MOUTH TWICE A DAY Patient taking differently: Take 50 mg by mouth 2 (two) times daily. TAKE 1 TABLET BY MOUTH TWICE A DAY 12/23/18  Yes Nafziger, Tommi Rumps, NP  mirtazapine (REMERON) 7.5 MG tablet Take 1 tablet (7.5 mg total) by mouth at bedtime. 06/09/19  Yes Frann Rider, NP    Physical  Exam: Constitutional: Moderately built and nourished. Vitals:   06/18/19 2025 06/18/19 2232 06/18/19 2251  BP: (!) 158/57 (!) 80/63 (!) 113/54  Pulse: 65 63   Resp: 14 18   Temp: (!) 97.1 F (36.2 C)    TempSrc: Axillary    SpO2:  98%    Eyes: Anicteric no pallor. ENMT: No discharge from the ears eyes nose or mouth. Neck: No mass felt.  No neck rigidity. Respiratory: No rhonchi or crepitations. Cardiovascular: S1-S2 heard. Abdomen: Soft nontender bowel sounds present. Musculoskeletal: No edema.  No joint effusion. Skin: No rash. Neurologic: Alert awake oriented to person.  Moves all extremities. Psychiatric: Has dementia.   Labs on Admission: I have personally reviewed following labs and imaging studies  CBC: Recent Labs  Lab 06/18/19 2056 06/18/19 2236  WBC 9.0 6.5  NEUTROABS 6.5  --   HGB 8.9* 7.9*  HCT 28.0* 24.8*  MCV 94.0 93.9  PLT 162 [id number]*   Basic Metabolic Panel: Recent Labs  Lab 06/18/19 2056  NA 143  K 3.3*  CL 110  CO2 24  GLUCOSE 142*  BUN 13  CREATININE 0.94  CALCIUM 8.3*   GFR: Estimated Creatinine Clearance: 29.6 mL/min (by C-G formula based on SCr of 0.94 mg/dL). Liver Function Tests: Recent Labs  Lab 06/18/19 2056  AST 32  ALT 18  ALKPHOS 59  BILITOT 1.2  PROT 5.0*  ALBUMIN 2.7*   Recent Labs  Lab 06/18/19 2056  LIPASE 33   No results for input(s): AMMONIA in the last 168 hours. Coagulation Profile: Recent Labs  Lab 06/18/19 2236  INR 1.2   Cardiac Enzymes: No results for input(s): CKTOTAL, CKMB, CKMBINDEX, TROPONINI in the last 168 hours. BNP (last 3 results) No results for input(s): PROBNP in the last 8760 hours. HbA1C: No results for input(s): HGBA1C in the last 72 hours. CBG: No results for input(s): GLUCAP in the last 168 hours. Lipid Profile: No results for input(s): CHOL, HDL, LDLCALC, TRIG, CHOLHDL, LDLDIRECT in the last 72 hours. Thyroid Function Tests: No results for input(s): TSH, T4TOTAL, FREET4,  T3FREE, THYROIDAB in the last 72 hours. Anemia Panel: No results for input(s): VITAMINB12, FOLATE, FERRITIN, TIBC, IRON, RETICCTPCT in the last 72 hours. Urine analysis:    Component Value Date/Time   COLORURINE YELLOW 05/12/2019 1243   APPEARANCEUR CLEAR 05/12/2019 1243   LABSPEC 1.033 (H) 05/12/2019 1243   PHURINE 6.0 05/12/2019 1243   GLUCOSEU NEGATIVE 05/12/2019 1243   HGBUR SMALL (A) 05/12/2019 1243   HGBUR 1+ 04/13/2010 [place] 05/12/2019 [place] 12/05/2016 [place] 05/12/2019 1243   PROTEINUR NEGATIVE 05/12/2019 1243  UROBILINOGEN 0.2 12/05/2016 1213   UROBILINOGEN 0.2 04/13/2010 0934   NITRITE NEGATIVE 05/12/2019 1243   LEUKOCYTESUR NEGATIVE 05/12/2019 1243   Sepsis Labs: @LABRCNTIP (procalcitonin:4,lacticidven:4) )No results found for this or any previous visit (from the past 240 hour(s)).   Radiological Exams on Admission: CT Head Wo Contrast  Result Date: 06/18/2019 CLINICAL DATA:  Mental status. Headache. Recent stroke. Fall. EXAM: CT HEAD WITHOUT CONTRAST TECHNIQUE: Contiguous axial images were obtained from the base of the skull through the vertex without intravenous contrast. COMPARISON:  Head CT and brain MRI 05/14/2019 FINDINGS: Brain: Left occipital infarct that was late subacute appearing on imaging last month has undergone expected evolution with developing encephalomalacia. No hemorrhagic transformation. No acute hemorrhage. No evidence of new ischemia. Stable degree of generalized atrophy and moderate to advanced chronic small vessel ischemia. Subdural or extra-axial collection. No midline shift, mass effect, or hydrocephalus. Vascular: Atherosclerosis of skullbase vasculature without hyperdense vessel or abnormal calcification. Skull: No fracture or focal lesion. Sinuses/Orbits: Paranasal sinuses and mastoid air cells are clear. The visualized orbits are unremarkable. Bilateral lens extraction. Other: None.  IMPRESSION: 1. Expected evolution of left occipital infarct with developing encephalomalacia since imaging last month. 2. No new abnormality. Stable atrophy and moderate to advanced chronic small vessel ischemia. Electronically Signed   By: Keith Rake M.D.   On: 06/18/2019 22:08    Assessment/Plan Principal Problem:   Acute GI bleeding Active Problems:   Hypothyroidism   Essential hypertension   Cognitive impairment   CVA (cerebral vascular accident) ([place])   Acute blood loss anemia    1. Acute GI bleeding -given the hematemesis likely upper GI.  Patient is being started on Protonix infusion Dr. Paulita Fujita gastroenterologist has been consulted.  1 unit of PRBC has been ordered.  Blood pressure is being improved.  Follow CBC after transfusion.  We will keep n.p.o. in anticipation of possible EGD. 2. Acute blood loss anemia secondary to GI bleed receiving 1 unit of PRBC.  Follow CBC after transfusion. 3. History of hypertension we will hold antihypertensives due to hypotension. 4. History of recent stroke will hold antiplatelet agents due to acute GI bleed.  Presently n.p.o. 5. Hypothyroidism we will dose IV Synthroid. 6. History of dementia.  Given that patient has had a significant drop in hemoglobin with presentation of hypotension concerning for developing shock patient will need close monitoring and more than 2 midnight stay.   DVT prophylaxis: SCDs. Code Status: DNR. Family Communication: ER physician discussed with patient's son. Disposition Plan: Home. Consults called: Copywriter, advertising. Admission status: Inpatient.   Rise Patience MD [facility] Pager [phone].  If 7PM-7AM, please contact night-coverage [email or web address] Password [email or web address]  06/18/2019, 11:28 PM

## 2019-06-18 NOTE — Patient Outreach (Signed)
[facility]) Care Management  06/18/2019  Maria Clarke 08-Dec-1921 [id number]   Case closure   Call attempts made on 06/02/19, 06/08/19 and 06/09/19  Unsuccessful outreach letter sent on 06/02/19  without a response   Plan [facility] RN CM will close case after no response from patient or son [name] within 10 business days. Unable to reach   [name]. [name], RN, BSN, [place] Coordinator Office number [phone] Mobile number [phone]  Main [facility] number [phone] Fax number [phone]

## 2019-06-18 NOTE — ED Notes (Signed)
Date and time results received: 06/18/19 "10:03 PM  Test: Lactic Acid Critical Value: 2.4  Name of Provider Notified: Tegeler  Orders Received? Or Actions Taken?: None at this time

## 2019-06-19 ENCOUNTER — Encounter (HOSPITAL_COMMUNITY): Payer: Self-pay | Admitting: Internal Medicine

## 2019-06-19 ENCOUNTER — Inpatient Hospital Stay (HOSPITAL_COMMUNITY): Payer: Medicare Other | Admitting: Anesthesiology

## 2019-06-19 ENCOUNTER — Encounter (HOSPITAL_COMMUNITY)
Admission: EM | Disposition: A | Discharge status: Home Health | Payer: Self-pay | Source: Home / Self Care | Attending: Internal Medicine

## 2019-06-19 DIAGNOSIS — D62 Acute posthemorrhagic anemia: Secondary | ICD-10-CM

## 2019-06-19 DIAGNOSIS — K250 Acute gastric ulcer with hemorrhage: Secondary | ICD-10-CM

## 2019-06-19 DIAGNOSIS — I63532 Cerebral infarction due to unspecified occlusion or stenosis of left posterior cerebral artery: Secondary | ICD-10-CM

## 2019-06-19 DIAGNOSIS — K920 Hematemesis: Secondary | ICD-10-CM

## 2019-06-19 DIAGNOSIS — R4189 Other symptoms and signs involving cognitive functions and awareness: Secondary | ICD-10-CM

## 2019-06-19 DIAGNOSIS — K2900 Acute gastritis without bleeding: Secondary | ICD-10-CM

## 2019-06-19 HISTORY — PX: ESOPHAGOGASTRODUODENOSCOPY (EGD) WITH PROPOFOL: SHX5813

## 2019-06-19 LAB — CBC
HCT: 27.2 % — ABNORMAL LOW (ref 36.0–46.0)
Hemoglobin: 8.9 g/dL — ABNORMAL LOW (ref 12.0–15.0)
MCH: 29.7 pg (ref 26.0–34.0)
MCHC: 32.7 g/dL (ref 30.0–36.0)
MCV: 90.7 fL (ref 80.0–100.0)
Platelets: 131 10*3/uL — ABNORMAL LOW (ref 150–400)
RBC: 3 MIL/uL — ABNORMAL LOW (ref 3.87–5.11)
RDW: 15.1 % (ref 11.5–15.5)
WBC: 5.9 10*3/uL (ref 4.0–10.5)
nRBC: 0 % (ref 0.0–0.2)

## 2019-06-19 LAB — BASIC METABOLIC PANEL
Anion gap: 9 (ref 5–15)
BUN: 16 mg/dL (ref 8–23)
CO2: 23 mmol/L (ref 22–32)
Calcium: 8 mg/dL — ABNORMAL LOW (ref 8.9–10.3)
Chloride: 113 mmol/L — ABNORMAL HIGH (ref 98–111)
Creatinine, Ser: 0.71 mg/dL (ref 0.44–1.00)
GFR calc Af Amer: >60 mL/min (ref >60)
GFR calc non Af Amer: >60 mL/min (ref >60)
Glucose, Bld: 110 mg/dL — ABNORMAL HIGH (ref 70–99)
Potassium: 3.3 mmol/L — ABNORMAL LOW (ref 3.5–5.1)
Sodium: 145 mmol/L (ref 135–145)

## 2019-06-19 LAB — CBG MONITORING, ED: Glucose-Capillary: 122 mg/dL — ABNORMAL HIGH (ref 70–99)

## 2019-06-19 LAB — LACTIC ACID, PLASMA: Lactic Acid, Venous: 1.4 mmol/L (ref 0.5–1.9)

## 2019-06-19 LAB — GLUCOSE, CAPILLARY
Glucose-Capillary: 109 mg/dL — ABNORMAL HIGH (ref 70–99)
Glucose-Capillary: 86 mg/dL (ref 70–99)
Glucose-Capillary: 144 mg/dL — ABNORMAL HIGH (ref 70–99)

## 2019-06-19 LAB — SARS CORONAVIRUS 2 (TAT 6-24 HRS): SARS Coronavirus 2: NEGATIVE

## 2019-06-19 SURGERY — ESOPHAGOGASTRODUODENOSCOPY (EGD) WITH PROPOFOL
Anesthesia: Monitor Anesthesia Care

## 2019-06-19 MED ORDER — LEVOTHYROXINE SODIUM 100 MCG/5ML IV SOLN
44.0000 ug | Freq: Every day | INTRAVENOUS | Status: DC
  Administered 2019-06-19: 44 ug via INTRAVENOUS
  Filled 2019-06-19 (×2): qty 5

## 2019-06-19 MED ORDER — PANTOPRAZOLE SODIUM 40 MG PO TBEC
40.0000 mg | DELAYED_RELEASE_TABLET | Freq: Two times a day (BID) | ORAL | Status: DC
  Administered 2019-06-20: 09:00:00 40 mg via ORAL
  Filled 2019-06-19 (×2): qty 1

## 2019-06-19 MED ORDER — SODIUM CHLORIDE 0.9 % IV SOLN
INTRAVENOUS | Status: DC | PRN
  Administered 2019-06-19: 14:00:00 via INTRAVENOUS

## 2019-06-19 MED ORDER — LORAZEPAM 2 MG/ML IJ SOLN
0.5000 mg | Freq: Once | INTRAMUSCULAR | Status: AC
  Administered 2019-06-19: 17:00:00 0.5 mg via INTRAVENOUS
  Filled 2019-06-19: qty 1

## 2019-06-19 MED ORDER — HALOPERIDOL LACTATE 5 MG/ML IJ SOLN
2.0000 mg | Freq: Once | INTRAMUSCULAR | Status: AC
  Administered 2019-06-19: 17:00:00 2 mg via INTRAVENOUS
  Filled 2019-06-19: qty 1

## 2019-06-19 MED ORDER — PROPOFOL 500 MG/50ML IV EMUL
INTRAVENOUS | Status: DC | PRN
  Administered 2019-06-19: 100 ug/kg/min via INTRAVENOUS

## 2019-06-19 MED ORDER — LORAZEPAM 2 MG/ML IJ SOLN: 0.5000 mg | Freq: Once | INTRAMUSCULAR | Status: DC

## 2019-06-19 MED ORDER — LORAZEPAM 2 MG/ML IJ SOLN
0.5000 mg | Freq: Once | INTRAMUSCULAR | Status: AC
  Administered 2019-06-19: 16:00:00 0.5 mg via INTRAVENOUS
  Filled 2019-06-19: qty 1

## 2019-06-19 MED ORDER — LORAZEPAM 0.5 MG PO TABS
0.5000 mg | ORAL_TABLET | Freq: Once | ORAL | Status: DC
  Filled 2019-06-19: qty 1

## 2019-06-19 MED ORDER — PROPOFOL 10 MG/ML IV BOLUS
INTRAVENOUS | Status: DC | PRN
  Administered 2019-06-19 (×2): 20 mg via INTRAVENOUS

## 2019-06-19 SURGICAL SUPPLY — 15 items

## 2019-06-19 NOTE — Progress Notes (Signed)
Patient alert to self at this time, does have history of dementia.  Patient has made multiple attempts to get out of bed since RN arrived on shift at 1900.  Patient is difficult to redirect, will lay back down when instructed but only for a few minutes and then attempts to get out of bed again.  RN asked patient where she was going and patient stated to her bed.  RN explained to patient that she was in the hospital and she was already in bed for the night.  RN asked patient if she was in pain, patient denied.  RN asked patient if she needed to go to the bathroom, patient denied need.  [facility] p[facility].

## 2019-06-19 NOTE — Progress Notes (Signed)
[facility] PROGRESS NOTE  Maria DAREZZO [id number] DOB: 1921-11-03 DOA: 06/18/2019 PCP: [name], NP  Assessment/Plan:  Maria Clarke is a 85 y.o. female with history of recent stroke on antiplatelet agents, hypertension, dementia, CAD, hypothyroidism was brought to the ER patient's son found that patient has been throwing up blood since this evening.  Over the last 2 days patient was noticed to have some nausea and burping.  This evening when she went to the bathroom she was found to be having hematemesis and almost fell onto the floor but patient son caught her but did hit her head.  Did not lose consciousness.  Following which patient had another episode of throwing up blood.  Also had some black stools. EMS was called and patient was found to be hypotensive.  ED Course: Hemoglobin dropped by 6 g of previous of 14 and is presently around 8.  Stool for occult blood was positive.  Covid test was negative.  TFsed  1 unit of PRBC was ordered.  CT head is unremarkable except for the expected evolution changes of recent stroke.  Acute blood loss anemia. GI bleeding -cont PPI. GI plans EGD. Monitor h/h TF as needed  History of hypertension we will hold antihypertensives due to hypotension.  History of recent stroke. hold antiplatelet agents due to acute GI bleed. Will resume post EGD.  Hypothyroidism. Cont Synthroid.  History of dementia.   Code Status: DNR Family Communication: d/w patient, RN (indicate person spoken with, relationship, and if by phone, the number) Disposition Plan: remains inpatient    Consultants:  GI  Procedures:  Pend EGD  Antibiotics:  none (indicate start date, and stop date if known)  HPI/Subjective: No distress. Reports feeling well   Objective: Vitals:   06/19/19 1207 06/19/19 1240  BP: (!) 151/54 (!) 153/66  Pulse: 69 76  Resp: 18 15  Temp: 98 F (36.7 C) 97.9 F (36.6 C)  SpO2: 100% 97%    Intake/Output Summary (Last  24 hours) at 06/19/2019 1248 Last data filed at 06/19/2019 1133 Gross per 24 hour  Intake 1613.71 ml  Output -  Net 1613.71 ml   Filed Weights   06/19/19 0245 06/19/19 0545  Weight: 58.2 kg 58.2 kg    Exam:   General:  No distress   Cardiovascular: s1,s2 rrr  Respiratory: CTA BL  Abdomen: soft, nt   Musculoskeletal: no leg edema   Data Reviewed: Basic Metabolic Panel: Recent Labs  Lab 06/18/19 2056 06/19/19 0632  NA 143 145  K 3.3* 3.3*  CL 110 113*  CO2 24 23  GLUCOSE 142* 110*  BUN 13 16  CREATININE 0.94 0.71  CALCIUM 8.3* 8.0*   Liver Function Tests: Recent Labs  Lab 06/18/19 2056  AST 32  ALT 18  ALKPHOS 59  BILITOT 1.2  PROT 5.0*  ALBUMIN 2.7*   Recent Labs  Lab 06/18/19 2056  LIPASE 33   No results for input(s): AMMONIA in the last 168 hours. CBC: Recent Labs  Lab 06/18/19 2056 06/18/19 2236 06/19/19 0632  WBC 9.0 6.5 5.9  NEUTROABS 6.5  --   --   HGB 8.9* 7.9* 8.9*  HCT 28.0* 24.8* 27.2*  MCV 94.0 93.9 90.7  PLT 162 130* 131*   Cardiac Enzymes: No results for input(s): CKTOTAL, CKMB, CKMBINDEX, TROPONINI in the last 168 hours. BNP (last 3 results) No results for input(s): BNP in the last 8760 hours.  ProBNP (last 3 results) No results for input(s): PROBNP in the  last 8760 hours.  CBG: Recent Labs  Lab 06/19/19 0132 06/19/19 0558 06/19/19 1204  GLUCAP 122* 109* 86    Recent Results (from the past 240 hour(s))  SARS CORONAVIRUS 2 (TAT 6-24 HRS) Nasopharyngeal Nasopharyngeal Swab     Status: None   Collection Time: 06/18/19 10:48 PM   Specimen: Nasopharyngeal Swab  Result Value Ref Range Status   SARS Coronavirus 2 NEGATIVE NEGATIVE Final    Comment: (NOTE) SARS-CoV-2 target nucleic acids are NOT DETECTED. The SARS-CoV-2 RNA is generally detectable in upper and lower respiratory specimens during the acute phase of infection. Negative results do not preclude SARS-CoV-2 infection, do not rule out co-infections with  other pathogens, and should not be used as the sole basis for treatment or other patient management decisions. Negative results must be combined with clinical observations, patient history, and epidemiological information. The expected result is Negative. Fact Sheet for Patients: SugarRoll.be Fact Sheet for Healthcare Providers: https://www.woods-mathews.com/ This test is not yet approved or cleared by the [place] FDA and  has been authorized for detection and/or diagnosis of SARS-CoV-2 by FDA under an Emergency Use Authorization (EUA). This EUA will remain  in effect (meaning this test can be used) for the duration of the COVID-19 declaration under Section 56 4(b)(1) of the Act, 21 U.S.C. section 360bbb-3(b)(1), unless the authorization is terminated or revoked sooner. Performed at [facility], [place] [address] [place], [place] [address]      Studies: CT Head Wo Contrast  Result Date: 06/18/2019 CLINICAL DATA:  Mental status. Headache. Recent stroke. Fall. EXAM: CT HEAD WITHOUT CONTRAST TECHNIQUE: Contiguous axial images were obtained from the base of the skull through the vertex without intravenous contrast. COMPARISON:  Head CT and brain MRI 05/14/2019 FINDINGS: Brain: Left occipital infarct that was late subacute appearing on imaging last month has undergone expected evolution with developing encephalomalacia. No hemorrhagic transformation. No acute hemorrhage. No evidence of new ischemia. Stable degree of generalized atrophy and moderate to advanced chronic small vessel ischemia. Subdural or extra-axial collection. No midline shift, mass effect, or hydrocephalus. Vascular: Atherosclerosis of skullbase vasculature without hyperdense vessel or abnormal calcification. Skull: No fracture or focal lesion. Sinuses/Orbits: Paranasal sinuses and mastoid air cells are clear. The visualized orbits are unremarkable. Bilateral lens extraction.  Other: None. IMPRESSION: 1. Expected evolution of left occipital infarct with developing encephalomalacia since imaging last month. 2. No new abnormality. Stable atrophy and moderate to advanced chronic small vessel ischemia. Electronically Signed   By: Keith Rake M.D.   On: 06/18/2019 22:08    Scheduled Meds: . [MAR Hold] sodium chloride   Intravenous Once  . [MAR Hold] levothyroxine  44 mcg Intravenous Daily  . [MAR Hold] pantoprazole  40 mg Intravenous Q12H   Continuous Infusions: . sodium chloride 125 mL/hr at 06/19/19 1007  . pantoprozole (PROTONIX) infusion 8 mg/hr (06/19/19 1010)    Principal Problem:   Acute GI bleeding Active Problems:   Hypothyroidism   Essential hypertension   Cognitive impairment   CVA (cerebral vascular accident) ([place])   Acute blood loss anemia    Time spent: >25 minutes     Kinnie Feil  [facility] Pager [phone]. If 7PM-7AM, please contact night-coverage at [email or web address], password [facility] 06/19/2019, 12:48 PM  LOS: 1 day

## 2019-06-19 NOTE — Op Note (Signed)
[facility] Patient Name: Maria Clarke Procedure Date : 06/19/2019 MRN: [id number] Attending MD: Jerene Bears , MD Date of Birth: 07/15/1921 CSN: [id number] Age: 85 Admit Type: Inpatient Procedure:                Upper GI endoscopy Indications:              Acute post hemorrhagic anemia, Hematemesis Providers:                Lajuan Lines. Hilarie Fredrickson, MD, Carlyn Reichert, RN, Lina Sar,                            Technician, Silas Flood, CRNA Referring MD:             [facility] Medicines:                Monitored Anesthesia Care Complications:            No immediate complications. Estimated Blood Loss:     Estimated blood loss: none. Procedure:                Pre-Anesthesia Assessment:                           - Prior to the procedure, a History and Physical                            was performed, and patient medications and                            allergies were reviewed. The patient's tolerance of                            previous anesthesia was also reviewed. The risks                            and benefits of the procedure and the sedation                            options and risks were discussed with the patient.                            All questions were answered, and informed consent                            was obtained. Prior Anticoagulants: The patient has                            taken Plavix (clopidogrel), last dose was 1 day                            prior to procedure. ASA Grade Assessment: III - A                            patient with severe systemic disease. After  reviewing the risks and benefits, the patient was                            deemed in satisfactory condition to undergo the                            procedure.                           After obtaining informed consent, the endoscope was                            passed under direct vision. Throughout the   procedure, the patient's blood pressure, pulse, and                            oxygen saturations were monitored continuously. The                            [id number]) Olympus gastroscope was                            introduced through the mouth, and advanced to the                            second part of duodenum. The upper GI endoscopy was                            accomplished without difficulty. The patient                            tolerated the procedure well. Scope In: Scope Out: Findings:      Normal mucosa was found in the entire esophagus. Z-line is regular at 38       cm from in the incisors.      A 2 cm hiatal hernia was present.      One non-bleeding cratered gastric ulcer with no stigmata of bleeding was       found in the gastric antrum. The lesion was 15 mm in largest dimension.       This is located in the pre-pyloric antrum but is non-obstructing.      Moderate inflammation characterized by erosions and erythema was found       in the gastric antrum.      The examined duodenum was normal. The papilla is patulous most likely       indicative of prior sphincterotomy. Impression:               - Normal mucosa was found in the entire esophagus.                           - 2 cm hiatal hernia.                           - Acute gastric ulcer with associated gastritis  with no stigmata of bleeding. Source of hematemesis                            and acute anemia.                           - Normal examined duodenum.                           - No specimens collected. Moderate Sedation:      N/A Recommendation:           - Return patient to hospital ward for ongoing care.                           - Advance diet as tolerated.                           - Continue present medications. Hold aspirin and                            Plavix until Hgb stabilizes.                           - No NSAIDs.                           - Check H.  Pylori stool antigen (she was not on PPI                            recently) to exclude active infection, though ulcer                            is likely related to ASA. Biopsies were not                            performed given recent GI bleeding and recent                            Plavix administration.                           - BID PPI for at least 8 weeks, and daily                            indefinitely if she remains on ASA and/or Plavix.                           - Repeat EGD would normally be recommended in 8-12                            weeks to document healing, but will be based on                            patient's overall clinical course. Procedure Code(s):        ---  Professional ---                           [phone], Esophagogastroduodenoscopy, flexible,                            transoral; diagnostic, including collection of                            specimen(s) by brushing or washing, when performed                            (separate procedure) Diagnosis Code(s):        --- Professional ---                           K44.9, Diaphragmatic hernia without obstruction or                            gangrene                           K25.9, Gastric ulcer, unspecified as acute or                            chronic, without hemorrhage or perforation                           K29.00, Acute gastritis without bleeding                           D62, Acute posthemorrhagic anemia                           K92.0, Hematemesis CPT copyright 2019 American Medical Association. All rights reserved. The codes documented in this report are preliminary and upon coder review may  be revised to meet current compliance requirements. Jerene Bears, MD 06/19/2019 2:06:20 PM This report has been signed electronically. Number of Addenda: 0

## 2019-06-19 NOTE — Anesthesia Preprocedure Evaluation (Signed)
Anesthesia Evaluation  Patient identified by MRN, date of birth, ID band Patient awake    Reviewed: Allergy & Precautions, NPO status , Patient's Chart, lab work & pertinent test results  History of Anesthesia Complications Negative for: history of anesthetic complications  Airway Mallampati: II  TM Distance: >3 FB Neck ROM: Full    Dental   Pulmonary neg pulmonary ROS,    Pulmonary exam normal        Cardiovascular hypertension, Normal cardiovascular exam+ dysrhythmias      Neuro/Psych Carotid stenosis TIACVA negative psych ROS   GI/Hepatic negative GI ROS, Neg liver ROS,   Endo/Other  Hypothyroidism   Renal/GU negative Renal ROS  negative genitourinary   Musculoskeletal negative musculoskeletal ROS (+)   Abdominal   Peds  Hematology negative hematology ROS (+)   Anesthesia Other Findings   Reproductive/Obstetrics                            Anesthesia Physical Anesthesia Plan  ASA: III  Anesthesia Plan: MAC   Post-op Pain Management:    Induction: Intravenous  PONV Risk Score and Plan: 2 and Propofol infusion, TIVA and Treatment may vary due to age or medical condition  Airway Management Planned: Natural Airway, Nasal Cannula and Simple Face Mask  Additional Equipment: None  Intra-op Plan:   Post-operative Plan:   Informed Consent: I have reviewed the patients History and Physical, chart, labs and discussed the procedure including the risks, benefits and alternatives for the proposed anesthesia with the patient or authorized representative who has indicated his/her understanding and acceptance.       Plan Discussed with:   Anesthesia Plan Comments:         Anesthesia Quick Evaluation

## 2019-06-19 NOTE — H&P (View-Only) (Signed)
Referring Provider: Dr. Daleen Bo, [facility] Primary Care Physician:  [name], NP Primary Gastroenterologist:  Althia Forts  Reason for Consultation:  Hematemesis  HPI: Maria Clarke is a 85 y.o. female with past medical history significant for hypertension, hypercholesterolemia, hypothyroidism, carotid artery disease with recent stroke in early November.  At that time her aspirin was increased from 81 mg daily to 325 mg daily and Plavix daily was added to her regimen.  She presents to the hospital on this occasion via EMS for episodes of hematemesis at home.  Patient was not able to provide any information so all history is obtained from the chart and from her sons, [name] and [name], who I spoke with via phone.  [name] tells me that she was in her usual state of health prior to yesterday.  She had no gastrointestinal complaints.  He said the only thing she ever complains about is her knee/leg.  Yesterday she woke up and had some vomiting, which then progressed to vomiting of blood.  This occurred on 2 or 3 occasions at home.  Sounds like she also had somewhat of a presyncopal episode.  EMS was called.  Upon evaluation the emergency department her BUN was found to be normal.  Hemoglobin is 7.9 g, down from 14.3 g just 1 month ago.  She was given 1 unit of packed red blood cells and hemoglobin increased to 8.9 g this morning.  She is been placed on PPI drip.  No further reports of hematemesis since her admission.  Blood pressure was low on admission, but is now hypertensive.  Otherwise hemodynamically stable.  Last dose of Plavix was 12/10 AM.   Past Medical History:  Diagnosis Date  . Atrial tachycardia ([place])    a. Noted 02/2013 on EKG.  . Carotid artery disease ([place])    Carotid duplex showed [id number] RICA and [id number] LICA  . Depression   . Hypercholesteremia   . Hypertension   . Hypothyroidism   . Stroke [facility])     Past Surgical History:  Procedure Laterality Date  . BREAST SURGERY Left    Benign Tumor- several years ago  . CESAREAN SECTION    . EYE SURGERY Bilateral    cataract    Prior to Admission medications   Medication Sig Start Date End Date Taking? Authorizing Provider  aspirin 325 MG tablet Take 1 tablet (325 mg total) by mouth daily. 05/14/19  Yes Christian, Rylee, MD  atorvastatin (LIPITOR) 80 MG tablet Take 1 tablet (80 mg total) by mouth daily at 6 PM. 06/09/19  Yes McCue, Janett Billow, NP  clopidogrel (PLAVIX) 75 MG tablet Take 1 tablet (75 mg total) by mouth daily. 06/09/19 09/07/19 Yes McCue, Janett Billow, NP  cyanocobalamin (,VITAMIN B-12,) 1000 MCG/ML injection Inject 1 mL (1,000 mcg total) into the skin every 30 (thirty) days. 12/23/18  Yes Nafziger, Tommi Rumps, NP  donepezil (ARICEPT) 5 MG tablet Take 1 tablet (5 mg total) by mouth at bedtime. 04/18/18  Yes Dorena Cookey, MD  escitalopram (LEXAPRO) 5 MG tablet Take 1 tablet (5 mg total) by mouth at bedtime. 12/23/18  Yes Nafziger, Tommi Rumps, NP  levothyroxine (SYNTHROID) 88 MCG tablet Take 1 tablet (88 mcg total) by mouth daily before breakfast. Patient taking differently: Take 100 mcg by mouth daily before breakfast.  05/19/19  Yes Christian, Rylee, MD  metoprolol tartrate (LOPRESSOR) 50 MG tablet TAKE 1 TABLET BY MOUTH TWICE A DAY Patient taking differently: Take 50 mg by mouth 2 (two) times daily. TAKE 1 TABLET BY  MOUTH TWICE A DAY 12/23/18  Yes Nafziger, Tommi Rumps, NP  mirtazapine (REMERON) 7.5 MG tablet Take 1 tablet (7.5 mg total) by mouth at bedtime. 06/09/19  Yes Frann Rider, NP    Current Facility-Administered Medications  Medication Dose Route Frequency Provider Last Rate Last Admin  . 0.9 %  sodium chloride infusion (Manually program via Guardrails IV Fluids)   Intravenous Once Rise Patience, MD      . 0.9 %  sodium chloride infusion   Intravenous Continuous Rise Patience, MD 125 mL/hr at 06/19/19 0050 New Bag at 06/19/19 0050  . acetaminophen (TYLENOL) tablet 650 mg  650 mg Oral Q6H PRN Rise Patience, MD        Or  . acetaminophen (TYLENOL) suppository 650 mg  650 mg Rectal Q6H PRN Rise Patience, MD      . levothyroxine (SYNTHROID, LEVOTHROID) injection 44 mcg  44 mcg Intravenous Daily Rise Patience, MD      . ondansetron [facility]) tablet 4 mg  4 mg Oral Q6H PRN Rise Patience, MD       Or  . ondansetron [facility]) injection 4 mg  4 mg Intravenous Q6H PRN Rise Patience, MD      . pantoprazole (PROTONIX) 80 mg in sodium chloride 0.9 % 250 mL (0.32 mg/mL) infusion  8 mg/hr Intravenous Continuous Rise Patience, MD      . Derrill Memo ON 06/22/2019] pantoprazole (PROTONIX) injection 40 mg  40 mg Intravenous Q12H Rise Patience, MD        Allergies as of 06/18/2019 - Review Complete 06/18/2019  Allergen Reaction Noted  . Sulfonamide derivatives Other (See Comments) 02/14/2007    Family History  Problem Relation Age of Onset  . Diabetes Mother   . Heart disease Mother        After age 57  . Hyperlipidemia Mother   . Hypertension Mother   . Heart disease Sister        After age 59  . Asthma Sister   . Hyperlipidemia Sister   . Heart disease Brother        After age 47  . Diabetes Son   . Hypertension Son     Social History   Socioeconomic History  . Marital status: Single    Spouse name: Not on file  . Number of children: Not on file  . Years of education: Not on file  . Highest education level: Not on file  Occupational History  . Occupation: Works part-time in Environmental health practitioner  . Smoking status: Never Smoker  . Smokeless tobacco: Never Used  Substance and Sexual Activity  . Alcohol use: No  . Drug use: No  . Sexual activity: Not on file  Other Topics Concern  . Not on file  Social History Narrative   Independent, family helps in her care.   Social Determinants of Health   Financial Resource Strain:   . Difficulty of Paying Living Expenses: Not on file  Food Insecurity:   . Worried About Charity fundraiser in the Last Year:  Not on file  . Ran Out of Food in the Last Year: Not on file  Transportation Needs:   . Lack of Transportation (Medical): Not on file  . Lack of Transportation (Non-Medical): Not on file  Physical Activity:   . Days of Exercise per Week: Not on file  . Minutes of Exercise per Session: Not on file  Stress:   . Feeling of  Stress : Not on file  Social Connections:   . Frequency of Communication with Friends and Family: Not on file  . Frequency of Social Gatherings with Friends and Family: Not on file  . Attends Religious Services: Not on file  . Active Member of Clubs or Organizations: Not on file  . Attends Archivist Meetings: Not on file  . Marital Status: Not on file  Intimate Partner Violence:   . Fear of Current or Ex-Partner: Not on file  . Emotionally Abused: Not on file  . Physically Abused: Not on file  . Sexually Abused: Not on file    Review of Systems: ROS is O/W negative except as mentioned in HPI.  Physical Exam: Vital signs in last 24 hours: Temp:  [97.1 F (36.2 C)-98.3 F (36.8 C)] 97.8 F (36.6 C) (12/11 0821) Pulse Rate:  [59-80] 76 (12/11 0815) Resp:  [0-29] 19 (12/11 0815) BP: (69-168)/(20-78) 166/53 (12/11 0815) SpO2:  [95 %-100 %] 97 % (12/11 0815) Weight:  [58.2 kg] 58.2 kg (12/11 0545) Last BM Date: (unknown) General:  Alert, frail white female, pleasant and cooperative in NAD Head:  Normocephalic and atraumatic.  Dried blood on her face and in her hair. Eyes:  Sclera clear, no icterus.  Conjunctiva pink. Ears:  Normal auditory acuity. Mouth:  No deformity or lesions.   Lungs:  Clear throughout to auscultation.  No wheezes, crackles, or rhonchi.  Heart:  Regular rate and rhythm; no murmurs, clicks, rubs, or gallops. Abdomen:  Soft, non-distended.  BS present.  Non-tender.  Msk:  Symmetrical without gross deformities. Pulses:  Normal pulses noted. Extremities:  Without clubbing or edema. Neurologic:  Alert to self and knew she was at  the hospital, but not which hospital.  Did not know the year. Skin:  Intact without significant lesions or rashes.  Intake/Output from previous day: 12/10 0701 - 12/11 0700 In: 972.8 [I.V.:718.8; Blood:254] Out: -   Lab Results: Recent Labs    06/18/19 2056 06/18/19 2236 06/19/19 0632  WBC 9.0 6.5 5.9  HGB 8.9* 7.9* 8.9*  HCT 28.0* 24.8* 27.2*  PLT 162 130* 131*   BMET Recent Labs    06/18/19 2056 06/19/19 0632  NA 143 145  K 3.3* 3.3*  CL 110 113*  CO2 24 23  GLUCOSE 142* 110*  BUN 13 16  CREATININE 0.94 0.71  CALCIUM 8.3* 8.0*   LFT Recent Labs    06/18/19 2056  PROT 5.0*  ALBUMIN 2.7*  AST 32  ALT 18  ALKPHOS 59  BILITOT 1.2   PT/INR Recent Labs    06/18/19 2236  LABPROT 14.6  INR 1.2   Studies/Results: CT Head Wo Contrast  Result Date: 06/18/2019 CLINICAL DATA:  Mental status. Headache. Recent stroke. Fall. EXAM: CT HEAD WITHOUT CONTRAST TECHNIQUE: Contiguous axial images were obtained from the base of the skull through the vertex without intravenous contrast. COMPARISON:  Head CT and brain MRI 05/14/2019 FINDINGS: Brain: Left occipital infarct that was late subacute appearing on imaging last month has undergone expected evolution with developing encephalomalacia. No hemorrhagic transformation. No acute hemorrhage. No evidence of new ischemia. Stable degree of generalized atrophy and moderate to advanced chronic small vessel ischemia. Subdural or extra-axial collection. No midline shift, mass effect, or hydrocephalus. Vascular: Atherosclerosis of skullbase vasculature without hyperdense vessel or abnormal calcification. Skull: No fracture or focal lesion. Sinuses/Orbits: Paranasal sinuses and mastoid air cells are clear. The visualized orbits are unremarkable. Bilateral lens extraction. Other: None. IMPRESSION: 1. Expected  evolution of left occipital infarct with developing encephalomalacia since imaging last month. 2. No new abnormality. Stable atrophy and  moderate to advanced chronic small vessel ischemia. Electronically Signed   By: Keith Rake M.D.   On: 06/18/2019 22:08   IMPRESSION:  *Hematemesis:  Had vomiting at home that then progressed to hematemesis (2-3 episodes at home).  Hgb down significantly, but BUN not elevated.  ? MWT vs ulcer, etc.  No reports of melena, but FOBT positive. *ABLA:  Hgb one month ago was 14.3 grams, down to 7.9 grams here.  Received one unit PRBC's and increased to 8.9 grams. *Hypokalemia:  K+ 3.3. *CVA last month with ASA increase form 81 mg to 325 mg at that time as well as addition to Plavix.  Last Plavix dose was 12/10. *COVID negative  PLAN: *Continue PPI gtt for now. *Monitory Hgb and transfuse further prn. *EGD later today with Dr. Hilarie Fredrickson.  I discussed with both of her sons, [name] and [name], via phone.  They gave consent for her procedure.   Laban Emperor. Wren Gallaga  06/19/2019, 9:27 AM

## 2019-06-19 NOTE — Progress Notes (Signed)
I spoke to the patient's son by phone after the procedure to inform them of the findings.  Time provided for questions and answers and they thanked me for the call. We will follow her and check on her tomorrow

## 2019-06-19 NOTE — Anesthesia Postprocedure Evaluation (Signed)
Anesthesia Post Note  Patient: Maria Clarke  Procedure(s) Performed: ESOPHAGOGASTRODUODENOSCOPY (EGD) WITH PROPOFOL (N/A )     Patient location during evaluation: Endoscopy Anesthesia Type: MAC Level of consciousness: awake and alert Pain management: pain level controlled Vital Signs Assessment: post-procedure vital signs reviewed and stable Respiratory status: spontaneous breathing, nonlabored ventilation and respiratory function stable Cardiovascular status: blood pressure returned to baseline and stable Postop Assessment: no apparent nausea or vomiting Anesthetic complications: no    Last Vitals:  Vitals:   06/19/19 1400 06/19/19 1410  BP: (!) 131/46 (!) 159/40  Pulse: 70 70  Resp: 16 17  Temp:    SpO2: 97% 100%    Last Pain:  Vitals:   06/19/19 1400  TempSrc:   PainSc: 0-No pain                 Lidia Collum

## 2019-06-19 NOTE — Transfer of Care (Signed)
Immediate Anesthesia Transfer of Care Note  Patient: Maria Clarke  Procedure(s) Performed: ESOPHAGOGASTRODUODENOSCOPY (EGD) WITH PROPOFOL (N/A )  Patient Location: Endoscopy Unit  Anesthesia Type:MAC  Level of Consciousness: awake and alert   Airway & Oxygen Therapy: Patient Spontanous Breathing and Patient connected to nasal cannula oxygen  Post-op Assessment: Report given to RN, Post -op Vital signs reviewed and stable and Patient moving all extremities  Post vital signs: Reviewed and stable  Last Vitals:  Vitals Value Taken Time  BP 131/46 06/19/19 1358  Temp    Pulse 69 06/19/19 1401  Resp 17 06/19/19 1401  SpO2 99 % 06/19/19 1401  Vitals shown include unvalidated device data.  Last Pain:  Vitals:   06/19/19 1240  TempSrc: Oral  PainSc: 0-No pain         Complications: No apparent anesthesia complications

## 2019-06-19 NOTE — Progress Notes (Signed)
Referring Provider: Dr. Daleen Bo, [facility] Primary Care Physician:  [name], NP Primary Gastroenterologist:  Althia Forts  Reason for Consultation:  Hematemesis  HPI: Maria Clarke is a 85 y.o. female with past medical history significant for hypertension, hypercholesterolemia, hypothyroidism, carotid artery disease with recent stroke in early November.  At that time her aspirin was increased from 81 mg daily to 325 mg daily and Plavix daily was added to her regimen.  She presents to the hospital on this occasion via EMS for episodes of hematemesis at home.  Patient was not able to provide any information so all history is obtained from the chart and from her sons, [name] and [name], who I spoke with via phone.  [name] tells me that she was in her usual state of health prior to yesterday.  She had no gastrointestinal complaints.  He said the only thing she ever complains about is her knee/leg.  Yesterday she woke up and had some vomiting, which then progressed to vomiting of blood.  This occurred on 2 or 3 occasions at home.  Sounds like she also had somewhat of a presyncopal episode.  EMS was called.  Upon evaluation the emergency department her BUN was found to be normal.  Hemoglobin is 7.9 g, down from 14.3 g just 1 month ago.  She was given 1 unit of packed red blood cells and hemoglobin increased to 8.9 g this morning.  She is been placed on PPI drip.  No further reports of hematemesis since her admission.  Blood pressure was low on admission, but is now hypertensive.  Otherwise hemodynamically stable.  Last dose of Plavix was 12/10 AM.   Past Medical History:  Diagnosis Date  . Atrial tachycardia ([place])    a. Noted 02/2013 on EKG.  . Carotid artery disease ([place])    Carotid duplex showed [id number] RICA and [id number] LICA  . Depression   . Hypercholesteremia   . Hypertension   . Hypothyroidism   . Stroke [facility])     Past Surgical History:  Procedure Laterality Date  . BREAST SURGERY Left    Benign Tumor- several years ago  . CESAREAN SECTION    . EYE SURGERY Bilateral    cataract    Prior to Admission medications   Medication Sig Start Date End Date Taking? Authorizing Provider  aspirin 325 MG tablet Take 1 tablet (325 mg total) by mouth daily. 05/14/19  Yes Christian, Rylee, MD  atorvastatin (LIPITOR) 80 MG tablet Take 1 tablet (80 mg total) by mouth daily at 6 PM. 06/09/19  Yes McCue, Janett Billow, NP  clopidogrel (PLAVIX) 75 MG tablet Take 1 tablet (75 mg total) by mouth daily. 06/09/19 09/07/19 Yes McCue, Janett Billow, NP  cyanocobalamin (,VITAMIN B-12,) 1000 MCG/ML injection Inject 1 mL (1,000 mcg total) into the skin every 30 (thirty) days. 12/23/18  Yes Nafziger, Tommi Rumps, NP  donepezil (ARICEPT) 5 MG tablet Take 1 tablet (5 mg total) by mouth at bedtime. 04/18/18  Yes Dorena Cookey, MD  escitalopram (LEXAPRO) 5 MG tablet Take 1 tablet (5 mg total) by mouth at bedtime. 12/23/18  Yes Nafziger, Tommi Rumps, NP  levothyroxine (SYNTHROID) 88 MCG tablet Take 1 tablet (88 mcg total) by mouth daily before breakfast. Patient taking differently: Take 100 mcg by mouth daily before breakfast.  05/19/19  Yes Christian, Rylee, MD  metoprolol tartrate (LOPRESSOR) 50 MG tablet TAKE 1 TABLET BY MOUTH TWICE A DAY Patient taking differently: Take 50 mg by mouth 2 (two) times daily. TAKE 1 TABLET BY  MOUTH TWICE A DAY 12/23/18  Yes Nafziger, Tommi Rumps, NP  mirtazapine (REMERON) 7.5 MG tablet Take 1 tablet (7.5 mg total) by mouth at bedtime. 06/09/19  Yes Frann Rider, NP    Current Facility-Administered Medications  Medication Dose Route Frequency Provider Last Rate Last Admin  . 0.9 %  sodium chloride infusion (Manually program via Guardrails IV Fluids)   Intravenous Once Rise Patience, MD      . 0.9 %  sodium chloride infusion   Intravenous Continuous Rise Patience, MD 125 mL/hr at 06/19/19 0050 New Bag at 06/19/19 0050  . acetaminophen (TYLENOL) tablet 650 mg  650 mg Oral Q6H PRN Rise Patience, MD        Or  . acetaminophen (TYLENOL) suppository 650 mg  650 mg Rectal Q6H PRN Rise Patience, MD      . levothyroxine (SYNTHROID, LEVOTHROID) injection 44 mcg  44 mcg Intravenous Daily Rise Patience, MD      . ondansetron [facility]) tablet 4 mg  4 mg Oral Q6H PRN Rise Patience, MD       Or  . ondansetron [facility]) injection 4 mg  4 mg Intravenous Q6H PRN Rise Patience, MD      . pantoprazole (PROTONIX) 80 mg in sodium chloride 0.9 % 250 mL (0.32 mg/mL) infusion  8 mg/hr Intravenous Continuous Rise Patience, MD      . Derrill Memo ON 06/22/2019] pantoprazole (PROTONIX) injection 40 mg  40 mg Intravenous Q12H Rise Patience, MD        Allergies as of 06/18/2019 - Review Complete 06/18/2019  Allergen Reaction Noted  . Sulfonamide derivatives Other (See Comments) 02/14/2007    Family History  Problem Relation Age of Onset  . Diabetes Mother   . Heart disease Mother        After age 78  . Hyperlipidemia Mother   . Hypertension Mother   . Heart disease Sister        After age 34  . Asthma Sister   . Hyperlipidemia Sister   . Heart disease Brother        After age 99  . Diabetes Son   . Hypertension Son     Social History   Socioeconomic History  . Marital status: Single    Spouse name: Not on file  . Number of children: Not on file  . Years of education: Not on file  . Highest education level: Not on file  Occupational History  . Occupation: Works part-time in Environmental health practitioner  . Smoking status: Never Smoker  . Smokeless tobacco: Never Used  Substance and Sexual Activity  . Alcohol use: No  . Drug use: No  . Sexual activity: Not on file  Other Topics Concern  . Not on file  Social History Narrative   Independent, family helps in her care.   Social Determinants of Health   Financial Resource Strain:   . Difficulty of Paying Living Expenses: Not on file  Food Insecurity:   . Worried About Charity fundraiser in the Last Year:  Not on file  . Ran Out of Food in the Last Year: Not on file  Transportation Needs:   . Lack of Transportation (Medical): Not on file  . Lack of Transportation (Non-Medical): Not on file  Physical Activity:   . Days of Exercise per Week: Not on file  . Minutes of Exercise per Session: Not on file  Stress:   . Feeling of  Stress : Not on file  Social Connections:   . Frequency of Communication with Friends and Family: Not on file  . Frequency of Social Gatherings with Friends and Family: Not on file  . Attends Religious Services: Not on file  . Active Member of Clubs or Organizations: Not on file  . Attends Archivist Meetings: Not on file  . Marital Status: Not on file  Intimate Partner Violence:   . Fear of Current or Ex-Partner: Not on file  . Emotionally Abused: Not on file  . Physically Abused: Not on file  . Sexually Abused: Not on file    Review of Systems: ROS is O/W negative except as mentioned in HPI.  Physical Exam: Vital signs in last 24 hours: Temp:  [97.1 F (36.2 C)-98.3 F (36.8 C)] 97.8 F (36.6 C) (12/11 0821) Pulse Rate:  [59-80] 76 (12/11 0815) Resp:  [0-29] 19 (12/11 0815) BP: (69-168)/(20-78) 166/53 (12/11 0815) SpO2:  [95 %-100 %] 97 % (12/11 0815) Weight:  [58.2 kg] 58.2 kg (12/11 0545) Last BM Date: (unknown) General:  Alert, frail white female, pleasant and cooperative in NAD Head:  Normocephalic and atraumatic.  Dried blood on her face and in her hair. Eyes:  Sclera clear, no icterus.  Conjunctiva pink. Ears:  Normal auditory acuity. Mouth:  No deformity or lesions.   Lungs:  Clear throughout to auscultation.  No wheezes, crackles, or rhonchi.  Heart:  Regular rate and rhythm; no murmurs, clicks, rubs, or gallops. Abdomen:  Soft, non-distended.  BS present.  Non-tender.  Msk:  Symmetrical without gross deformities. Pulses:  Normal pulses noted. Extremities:  Without clubbing or edema. Neurologic:  Alert to self and knew she was at  the hospital, but not which hospital.  Did not know the year. Skin:  Intact without significant lesions or rashes.  Intake/Output from previous day: 12/10 0701 - 12/11 0700 In: 972.8 [I.V.:718.8; Blood:254] Out: -   Lab Results: Recent Labs    06/18/19 2056 06/18/19 2236 06/19/19 0632  WBC 9.0 6.5 5.9  HGB 8.9* 7.9* 8.9*  HCT 28.0* 24.8* 27.2*  PLT 162 130* 131*   BMET Recent Labs    06/18/19 2056 06/19/19 0632  NA 143 145  K 3.3* 3.3*  CL 110 113*  CO2 24 23  GLUCOSE 142* 110*  BUN 13 16  CREATININE 0.94 0.71  CALCIUM 8.3* 8.0*   LFT Recent Labs    06/18/19 2056  PROT 5.0*  ALBUMIN 2.7*  AST 32  ALT 18  ALKPHOS 59  BILITOT 1.2   PT/INR Recent Labs    06/18/19 2236  LABPROT 14.6  INR 1.2   Studies/Results: CT Head Wo Contrast  Result Date: 06/18/2019 CLINICAL DATA:  Mental status. Headache. Recent stroke. Fall. EXAM: CT HEAD WITHOUT CONTRAST TECHNIQUE: Contiguous axial images were obtained from the base of the skull through the vertex without intravenous contrast. COMPARISON:  Head CT and brain MRI 05/14/2019 FINDINGS: Brain: Left occipital infarct that was late subacute appearing on imaging last month has undergone expected evolution with developing encephalomalacia. No hemorrhagic transformation. No acute hemorrhage. No evidence of new ischemia. Stable degree of generalized atrophy and moderate to advanced chronic small vessel ischemia. Subdural or extra-axial collection. No midline shift, mass effect, or hydrocephalus. Vascular: Atherosclerosis of skullbase vasculature without hyperdense vessel or abnormal calcification. Skull: No fracture or focal lesion. Sinuses/Orbits: Paranasal sinuses and mastoid air cells are clear. The visualized orbits are unremarkable. Bilateral lens extraction. Other: None. IMPRESSION: 1. Expected  evolution of left occipital infarct with developing encephalomalacia since imaging last month. 2. No new abnormality. Stable atrophy and  moderate to advanced chronic small vessel ischemia. Electronically Signed   By: Keith Rake M.D.   On: 06/18/2019 22:08   IMPRESSION:  *Hematemesis:  Had vomiting at home that then progressed to hematemesis (2-3 episodes at home).  Hgb down significantly, but BUN not elevated.  ? MWT vs ulcer, etc.  No reports of melena, but FOBT positive. *ABLA:  Hgb one month ago was 14.3 grams, down to 7.9 grams here.  Received one unit PRBC's and increased to 8.9 grams. *Hypokalemia:  K+ 3.3. *CVA last month with ASA increase form 81 mg to 325 mg at that time as well as addition to Plavix.  Last Plavix dose was 12/10. *COVID negative  PLAN: *Continue PPI gtt for now. *Monitory Hgb and transfuse further prn. *EGD later today with Dr. Hilarie Fredrickson.  I discussed with both of her sons, [name] and [name], via phone.  They gave consent for her procedure.   Laban Emperor. Janet Decesare  06/19/2019, 9:27 AM

## 2019-06-19 NOTE — Interval H&P Note (Signed)
History and Physical Interval Note: For EGD to evaluate drop in Hgb associated with hematemesis.  Plavix with last dose believed to have been yesterday. Also uses ASA. Consent obtained from patient's sons via phone. HIGHER THAN BASELINE RISK.The nature of the procedure, as well as the risks, benefits, and alternatives were carefully and thoroughly reviewed with the patient. Ample time for discussion and questions allowed. The patient understood, was satisfied, and agreed to proceed.     06/19/2019 1:31 PM  Maria Clarke  has presented today for surgery, with the diagnosis of Hematemesis, UGI bleed.  The various methods of treatment have been discussed with the patient and family. After consideration of risks, benefits and other options for treatment, the patient has consented to  Procedure(s): ESOPHAGOGASTRODUODENOSCOPY (EGD) WITH PROPOFOL (N/A) as a surgical intervention.  The patient's history has been reviewed, patient examined, no change in status, stable for surgery.  I have reviewed the patient's chart and labs.  Questions were answered to the patient's satisfaction.     [name]

## 2019-06-19 NOTE — Anesthesia Procedure Notes (Signed)
Procedure Name: MAC Date/Time: 06/19/2019 1:38 PM Performed by: Amadeo Garnet, CRNA Pre-anesthesia Checklist: Patient identified, Emergency Drugs available, Suction available and Patient being monitored Patient Re-evaluated:Patient Re-evaluated prior to induction Oxygen Delivery Method: Nasal cannula Preoxygenation: Pre-oxygenation with 100% oxygen Induction Type: IV induction Placement Confirmation: positive ETCO2 and breath sounds checked- equal and bilateral Dental Injury: Teeth and Oropharynx as per pre-operative assessment

## 2019-06-20 DIAGNOSIS — K250 Acute gastric ulcer with hemorrhage: Principal | ICD-10-CM

## 2019-06-20 LAB — CBC
HCT: 29.1 % — ABNORMAL LOW (ref 36.0–46.0)
Hemoglobin: 9.5 g/dL — ABNORMAL LOW (ref 12.0–15.0)
MCH: 29.6 pg (ref 26.0–34.0)
MCHC: 32.6 g/dL (ref 30.0–36.0)
MCV: 90.7 fL (ref 80.0–100.0)
Platelets: 154 10*3/uL (ref 150–400)
RBC: 3.21 MIL/uL — ABNORMAL LOW (ref 3.87–5.11)
RDW: 14.9 % (ref 11.5–15.5)
WBC: 6.3 10*3/uL (ref 4.0–10.5)
nRBC: 0 % (ref 0.0–0.2)

## 2019-06-20 LAB — HEMOGLOBIN AND HEMATOCRIT, BLOOD
HCT: 24.2 % — ABNORMAL LOW (ref 36.0–46.0)
Hemoglobin: 7.9 g/dL — ABNORMAL LOW (ref 12.0–15.0)

## 2019-06-20 LAB — PREPARE RBC (CROSSMATCH)

## 2019-06-20 LAB — GLUCOSE, CAPILLARY
Glucose-Capillary: 102 mg/dL — ABNORMAL HIGH (ref 70–99)
Glucose-Capillary: 88 mg/dL (ref 70–99)
Glucose-Capillary: 96 mg/dL (ref 70–99)

## 2019-06-20 MED ORDER — HYDRALAZINE HCL 20 MG/ML IJ SOLN
10.0000 mg | Freq: Once | INTRAMUSCULAR | Status: AC
  Administered 2019-06-20: 08:00:00 10 mg via INTRAVENOUS
  Filled 2019-06-20: qty 1

## 2019-06-20 MED ORDER — MORPHINE SULFATE (PF) 2 MG/ML IV SOLN
1.0000 mg | INTRAVENOUS | Status: AC | PRN
  Administered 2019-06-21 – 2019-06-22 (×3): 2 mg via INTRAVENOUS
  Filled 2019-06-20 (×3): qty 1

## 2019-06-20 MED ORDER — LEVOTHYROXINE SODIUM 88 MCG PO TABS
88.0000 ug | ORAL_TABLET | Freq: Every day | ORAL | Status: DC
  Administered 2019-06-20 – 2019-06-30 (×8): 88 ug via ORAL
  Filled 2019-06-20 (×11): qty 1

## 2019-06-20 MED ORDER — SODIUM CHLORIDE 0.9 % IV SOLN
8.0000 mg/h | INTRAVENOUS | Status: DC
  Administered 2019-06-20 – 2019-06-21 (×3): 8 mg/h via INTRAVENOUS
  Filled 2019-06-20 (×4): qty 80

## 2019-06-20 MED ORDER — MORPHINE SULFATE (PF) 2 MG/ML IV SOLN
1.0000 mg | Freq: Once | INTRAVENOUS | Status: AC
  Administered 2019-06-20: 1 mg via INTRAVENOUS
  Filled 2019-06-20: qty 1

## 2019-06-20 MED ORDER — SODIUM CHLORIDE 0.9% IV SOLUTION: Freq: Once | INTRAVENOUS | Status: DC

## 2019-06-20 MED ORDER — LORAZEPAM 2 MG/ML IJ SOLN
0.5000 mg | Freq: Once | INTRAMUSCULAR | Status: AC
  Administered 2019-06-20: 0.5 mg via INTRAVENOUS
  Filled 2019-06-20: qty 1

## 2019-06-20 MED ORDER — FUROSEMIDE 10 MG/ML IJ SOLN
20.0000 mg | Freq: Once | INTRAMUSCULAR | Status: AC
  Administered 2019-06-20: 20 mg via INTRAVENOUS
  Filled 2019-06-20: qty 2

## 2019-06-20 MED ORDER — POTASSIUM CHLORIDE CRYS ER 20 MEQ PO TBCR
20.0000 meq | EXTENDED_RELEASE_TABLET | Freq: Once | ORAL | Status: AC
  Administered 2019-06-20: 20 meq via ORAL
  Filled 2019-06-20: qty 1

## 2019-06-20 MED ORDER — PANTOPRAZOLE SODIUM 40 MG IV SOLR: 40.0000 mg | Freq: Two times a day (BID) | INTRAVENOUS | Status: DC

## 2019-06-20 MED ORDER — METOPROLOL TARTRATE 50 MG PO TABS
50.0000 mg | ORAL_TABLET | Freq: Two times a day (BID) | ORAL | Status: DC
  Administered 2019-06-20 – 2019-07-03 (×23): 50 mg via ORAL
  Filled 2019-06-20 (×25): qty 1

## 2019-06-20 MED ORDER — LORAZEPAM 2 MG/ML IJ SOLN
0.5000 mg | Freq: Four times a day (QID) | INTRAMUSCULAR | Status: AC | PRN
  Administered 2019-06-20 – 2019-06-22 (×3): 0.5 mg via INTRAVENOUS
  Filled 2019-06-20 (×4): qty 1

## 2019-06-20 MED ORDER — METOCLOPRAMIDE HCL 5 MG/ML IJ SOLN
5.0000 mg | Freq: Once | INTRAMUSCULAR | Status: AC
  Administered 2019-06-20: 16:00:00 5 mg via INTRAVENOUS
  Filled 2019-06-20: qty 2

## 2019-06-20 MED ORDER — SODIUM CHLORIDE 0.9 % IV SOLN
80.0000 mg | Freq: Once | INTRAVENOUS | Status: AC
  Administered 2019-06-20: 80 mg via INTRAVENOUS
  Filled 2019-06-20: qty 80

## 2019-06-20 NOTE — Progress Notes (Signed)
Manual blood pressure obtained per care order and was 190/86.  RN text paged [facility].

## 2019-06-20 NOTE — Progress Notes (Signed)
RN into recheck morning blood pressure and was 211/91, HR 80-90's.  [facility] paged with this information.

## 2019-06-20 NOTE — Progress Notes (Addendum)
[facility] PROGRESS NOTE  Maria Clarke [id number] DOB: Nov 24, 1921 DOA: 06/18/2019 PCP: [name], NP  Assessment/Plan:  Maria Clarke is a 85 y.o. female with history of recent stroke on antiplatelet agents, hypertension, dementia, CAD, hypothyroidism was brought to the ER patient's son found that patient has been throwing up blood since this evening.  Over the last 2 days patient was noticed to have some nausea and burping.  This evening when she went to the bathroom she was found to be having hematemesis and almost fell onto the floor but patient son caught her but did hit her head.  Did not lose consciousness.  Following which patient had another episode of throwing up blood.  Also had some black stools. EMS was called and patient was found to be hypotensive.  ED Course: Hemoglobin dropped by 6 g of previous of 14 and is presently around 8.  Stool for occult blood was positive.  Covid test was negative.  TFsed  1 unit of PRBC was ordered.  CT head is unremarkable except for the expected evolution changes of recent stroke.  Acute blood loss anemia. GI bleeding due to gastric ulcer.  -underwent EGD (12/11): 15 mm gastric ulcer, no bleeding  -TFsed 2U on 12/11. Repeat Hg is 9.5. cont PPI. Recheck Hg tomorrow. Monitor h/h TF as needed.   History of hypertension. Restarted her home BB. Titrate as needed. May need second agent/amlodpine if uncontrolled   History of recent stroke. hold antiplatelet agents due to acute GI bleed. Will resume plavix in 24-48 hrs if Hg remains stable .  Hypothyroidism. Cont Synthroid.  History of dementia. Agitation. delirium on 12/11. Much better today. Monitor    Addendum: 3.20PM: Pt had large amount of hematemesis, clots with some bright red blood. Hemodynamically  stable. D/w GI. Will try reglan, transition to IV PPI. Obtain stat h/h. TF if needed. I have called updated her family as well.   Code Status: DNR Family Communication: d/w  patient, RN. Called her family. No answer. Will try again (indicate person spoken with, relationship, and if by phone, the number) Disposition Plan: remains inpatient    Consultants:  GI  Procedures:  EGD  Antibiotics:  none (indicate start date, and stop date if known)  HPI/Subjective: Less agitated today. No distress. Reports feeling well. S/p EGD  Objective: Vitals:   06/20/19 0817 06/20/19 0846  BP: (!) 151/86 (!) 151/64  Pulse: 81 86  Resp: (!) 22 (!) 21  Temp:    SpO2: 100% 100%    Intake/Output Summary (Last 24 hours) at 06/20/2019 1133 Last data filed at 06/20/2019 0500 Gross per 24 hour  Intake 967.4 ml  Output 300 ml  Net 667.4 ml   Filed Weights   06/19/19 0245 06/19/19 0545 06/20/19 0512  Weight: 58.2 kg 58.2 kg 58.9 kg    Exam:   General:  No distress   Cardiovascular: s1,s2 rrr  Respiratory: CTA BL  Abdomen: soft, nt   Musculoskeletal: no leg edema   Data Reviewed: Basic Metabolic Panel: Recent Labs  Lab 06/18/19 2056 06/19/19 0632  NA 143 145  K 3.3* 3.3*  CL 110 113*  CO2 24 23  GLUCOSE 142* 110*  BUN 13 16  CREATININE 0.94 0.71  CALCIUM 8.3* 8.0*   Liver Function Tests: Recent Labs  Lab 06/18/19 2056  AST 32  ALT 18  ALKPHOS 59  BILITOT 1.2  PROT 5.0*  ALBUMIN 2.7*   Recent Labs  Lab 06/18/19 2056  LIPASE  33   No results for input(s): AMMONIA in the last 168 hours. CBC: Recent Labs  Lab 06/18/19 2056 06/18/19 2236 06/19/19 0632 06/20/19 1044  WBC 9.0 6.5 5.9 6.3  NEUTROABS 6.5  --   --   --   HGB 8.9* 7.9* 8.9* 9.5*  HCT 28.0* 24.8* 27.2* 29.1*  MCV 94.0 93.9 90.7 90.7  PLT 162 130* 131* 154   Cardiac Enzymes: No results for input(s): CKTOTAL, CKMB, CKMBINDEX, TROPONINI in the last 168 hours. BNP (last 3 results) No results for input(s): BNP in the last 8760 hours.  ProBNP (last 3 results) No results for input(s): PROBNP in the last 8760 hours.  CBG: Recent Labs  Lab 06/19/19 0558  06/19/19 1204 06/19/19 1933 06/20/19 0004 06/20/19 0640  GLUCAP 109* 86 144* 88 96    Recent Results (from the past 240 hour(s))  SARS CORONAVIRUS 2 (TAT 6-24 HRS) Nasopharyngeal Nasopharyngeal Swab     Status: None   Collection Time: 06/18/19 10:48 PM   Specimen: Nasopharyngeal Swab  Result Value Ref Range Status   SARS Coronavirus 2 NEGATIVE NEGATIVE Final    Comment: (NOTE) SARS-CoV-2 target nucleic acids are NOT DETECTED. The SARS-CoV-2 RNA is generally detectable in upper and lower respiratory specimens during the acute phase of infection. Negative results do not preclude SARS-CoV-2 infection, do not rule out co-infections with other pathogens, and should not be used as the sole basis for treatment or other patient management decisions. Negative results must be combined with clinical observations, patient history, and epidemiological information. The expected result is Negative. Fact Sheet for Patients: SugarRoll.be Fact Sheet for Healthcare Providers: https://www.woods-mathews.com/ This test is not yet approved or cleared by the [place] FDA and  has been authorized for detection and/or diagnosis of SARS-CoV-2 by FDA under an Emergency Use Authorization (EUA). This EUA will remain  in effect (meaning this test can be used) for the duration of the COVID-19 declaration under Section 56 4(b)(1) of the Act, 21 U.S.C. section 360bbb-3(b)(1), unless the authorization is terminated or revoked sooner. Performed at [facility], [place] [address] [place], [place] [address]      Studies: CT Head Wo Contrast  Result Date: 06/18/2019 CLINICAL DATA:  Mental status. Headache. Recent stroke. Fall. EXAM: CT HEAD WITHOUT CONTRAST TECHNIQUE: Contiguous axial images were obtained from the base of the skull through the vertex without intravenous contrast. COMPARISON:  Head CT and brain MRI 05/14/2019 FINDINGS: Brain: Left occipital  infarct that was late subacute appearing on imaging last month has undergone expected evolution with developing encephalomalacia. No hemorrhagic transformation. No acute hemorrhage. No evidence of new ischemia. Stable degree of generalized atrophy and moderate to advanced chronic small vessel ischemia. Subdural or extra-axial collection. No midline shift, mass effect, or hydrocephalus. Vascular: Atherosclerosis of skullbase vasculature without hyperdense vessel or abnormal calcification. Skull: No fracture or focal lesion. Sinuses/Orbits: Paranasal sinuses and mastoid air cells are clear. The visualized orbits are unremarkable. Bilateral lens extraction. Other: None. IMPRESSION: 1. Expected evolution of left occipital infarct with developing encephalomalacia since imaging last month. 2. No new abnormality. Stable atrophy and moderate to advanced chronic small vessel ischemia. Electronically Signed   By: Keith Rake M.D.   On: 06/18/2019 22:08    Scheduled Meds: . sodium chloride   Intravenous Once  . levothyroxine  88 mcg Oral Q0600  . LORazepam  0.5 mg Intravenous Once  . metoprolol tartrate  50 mg Oral BID  . pantoprazole  40 mg Oral BID AC  Continuous Infusions:   Principal Problem:   Acute GI bleeding Active Problems:   Hypothyroidism   Essential hypertension   Cognitive impairment   CVA (cerebral vascular accident) ([place])   Acute blood loss anemia   Acute gastric ulcer with hemorrhage    Time spent: >25 minutes     Kinnie Feil  [facility] Pager [phone]. If 7PM-7AM, please contact night-coverage at [email or web address], password [facility] 06/20/2019, 11:33 AM  LOS: 2 days

## 2019-06-20 NOTE — Progress Notes (Signed)
Large amt of dark bloody clots while sitting up in wheelchair. Pt became unresponsive and returned to bed. Regained consciousness once back in bed. Initial BP = 150/101. Then 104/50.  HR 90's SR with occ PVC's. Dr Daleen Bo into room. Orders received.

## 2019-06-20 NOTE — Progress Notes (Signed)
     [facility] Progress Note  CC:  Gastric ulcer  Assessment: Gastric ulcer bleed with associated GI blood loss anemia likely due to ASA    - has required 2 units of PRBCs    - EGD 06/19/19: 29mm non-bleeding cratered GU with no stigmata of bleeding Hiatal hernia  Awaiting repeat hemoglobin today. No overt bleeding overnight. Hypertensive this morning.   Plan: Protonix 40 mg BID x 8 weeks Avoid NSAIDs as you are able Await H pylori stool antigen results Resume Plavix 06/21/19 if still clinically appropriate Follow-up with PA Zehr or Dr. Rush Landmark after hospital discharge  Subjective: Resting comfortable today. No overt bleeding. Not interested in breakfast. No specific complaints or concerns today.  Sitter at the bedside.   Objective:  Vital signs in last 24 hours: Temp:  [97.9 F (36.6 C)-98 F (36.7 C)] 97.9 F (36.6 C) (12/12 0512) Pulse Rate:  [69-98] 86 (12/12 0846) Resp:  [15-24] 21 (12/12 0846) BP: (131-211)/(40-91) 151/64 (12/12 0846) SpO2:  [97 %-100 %] 100 % (12/12 0846) Weight:  [58.9 kg] 58.9 kg (12/12 0512) Last BM Date: 06/19/19 General:   Alert, in NAD, Confused. Lying in the hospital bed. Clear liquid tray at the bedside.  Abdomen:  Soft. Nontender. Nondistended. Normal bowel sounds. No rebound or guarding. LAD: No inguinal or umbilical LAD Psych:  Alert and cooperative. Normal mood and affect.  Lab Results: Recent Labs    06/18/19 2056 06/18/19 2236 06/19/19 0632  WBC 9.0 6.5 5.9  HGB 8.9* 7.9* 8.9*  HCT 28.0* 24.8* 27.2*  PLT 162 130* 131*   BMET Recent Labs    06/18/19 2056 06/19/19 0632  NA 143 145  K 3.3* 3.3*  CL 110 113*  CO2 24 23  GLUCOSE 142* 110*  BUN 13 16  CREATININE 0.94 0.71  CALCIUM 8.3* 8.0*   LFT Recent Labs    06/18/19 2056  PROT 5.0*  ALBUMIN 2.7*  AST 32  ALT 18  ALKPHOS 59  BILITOT 1.2   PT/INR Recent Labs    06/18/19 2236  LABPROT 14.6  INR 1.2   Hepatitis Panel No results for  input(s): HEPBSAG, HCVAB, HEPAIGM, HEPBIGM in the last 72 hours.  CT Head Wo Contrast  Result Date: 06/18/2019 CLINICAL DATA:  Mental status. Headache. Recent stroke. Fall. EXAM: CT HEAD WITHOUT CONTRAST TECHNIQUE: Contiguous axial images were obtained from the base of the skull through the vertex without intravenous contrast. COMPARISON:  Head CT and brain MRI 05/14/2019 FINDINGS: Brain: Left occipital infarct that was late subacute appearing on imaging last month has undergone expected evolution with developing encephalomalacia. No hemorrhagic transformation. No acute hemorrhage. No evidence of new ischemia. Stable degree of generalized atrophy and moderate to advanced chronic small vessel ischemia. Subdural or extra-axial collection. No midline shift, mass effect, or hydrocephalus. Vascular: Atherosclerosis of skullbase vasculature without hyperdense vessel or abnormal calcification. Skull: No fracture or focal lesion. Sinuses/Orbits: Paranasal sinuses and mastoid air cells are clear. The visualized orbits are unremarkable. Bilateral lens extraction. Other: None. IMPRESSION: 1. Expected evolution of left occipital infarct with developing encephalomalacia since imaging last month. 2. No new abnormality. Stable atrophy and moderate to advanced chronic small vessel ischemia. Electronically Signed   By: Keith Rake M.D.   On: 06/18/2019 22:08      LOS: 2 days   Thornton Park  06/20/2019, 9:39 AM

## 2019-06-20 NOTE — Progress Notes (Signed)
Telesitter placed in room for safety. Frequently attempts oob and pulls at iv lines and heart monitor.

## 2019-06-21 ENCOUNTER — Encounter (HOSPITAL_COMMUNITY)
Admission: EM | Disposition: A | Discharge status: Home Health | Payer: Self-pay | Source: Home / Self Care | Attending: Internal Medicine

## 2019-06-21 ENCOUNTER — Inpatient Hospital Stay (HOSPITAL_COMMUNITY): Payer: Medicare Other | Admitting: Anesthesiology

## 2019-06-21 ENCOUNTER — Encounter (HOSPITAL_COMMUNITY): Payer: Self-pay | Admitting: Internal Medicine

## 2019-06-21 DIAGNOSIS — K259 Gastric ulcer, unspecified as acute or chronic, without hemorrhage or perforation: Secondary | ICD-10-CM

## 2019-06-21 HISTORY — PX: ESOPHAGOGASTRODUODENOSCOPY (EGD) WITH PROPOFOL: SHX5813

## 2019-06-21 LAB — BASIC METABOLIC PANEL
Anion gap: 11 (ref 5–15)
BUN: 16 mg/dL (ref 8–23)
CO2: 22 mmol/L (ref 22–32)
Calcium: 8.7 mg/dL — ABNORMAL LOW (ref 8.9–10.3)
Chloride: 111 mmol/L (ref 98–111)
Creatinine, Ser: 0.86 mg/dL (ref 0.44–1.00)
GFR calc Af Amer: >60 mL/min (ref >60)
GFR calc non Af Amer: 57 mL/min — ABNORMAL LOW (ref >60)
Glucose, Bld: 98 mg/dL (ref 70–99)
Potassium: 3.4 mmol/L — ABNORMAL LOW (ref 3.5–5.1)
Sodium: 144 mmol/L (ref 135–145)

## 2019-06-21 LAB — HEMOGLOBIN AND HEMATOCRIT, BLOOD
HCT: 30.6 % — ABNORMAL LOW (ref 36.0–46.0)
HCT: 30 % — ABNORMAL LOW (ref 36.0–46.0)
HCT: 31.1 % — ABNORMAL LOW (ref 36.0–46.0)
Hemoglobin: 10.7 g/dL — ABNORMAL LOW (ref 12.0–15.0)
Hemoglobin: 10.5 g/dL — ABNORMAL LOW (ref 12.0–15.0)
Hemoglobin: 10.6 g/dL — ABNORMAL LOW (ref 12.0–15.0)

## 2019-06-21 LAB — CBC
HCT: 32.4 % — ABNORMAL LOW (ref 36.0–46.0)
Hemoglobin: 10.9 g/dL — ABNORMAL LOW (ref 12.0–15.0)
MCH: 30.5 pg (ref 26.0–34.0)
MCHC: 33.6 g/dL (ref 30.0–36.0)
MCV: 90.8 fL (ref 80.0–100.0)
Platelets: 143 10*3/uL — ABNORMAL LOW (ref 150–400)
RBC: 3.57 MIL/uL — ABNORMAL LOW (ref 3.87–5.11)
RDW: 14.1 % (ref 11.5–15.5)
WBC: 6.4 10*3/uL (ref 4.0–10.5)
nRBC: 0 % (ref 0.0–0.2)

## 2019-06-21 SURGERY — ESOPHAGOGASTRODUODENOSCOPY (EGD) WITH PROPOFOL
Anesthesia: Monitor Anesthesia Care

## 2019-06-21 MED ORDER — SODIUM CHLORIDE 0.9% FLUSH
10.0000 mL | INTRAVENOUS | Status: DC | PRN
  Administered 2019-06-21: 21:00:00 20 mL

## 2019-06-21 MED ORDER — SODIUM CHLORIDE 0.9 % IV SOLN
INTRAVENOUS | Status: DC
  Administered 2019-06-21: 11:00:00 via INTRAVENOUS

## 2019-06-21 MED ORDER — HALOPERIDOL LACTATE 5 MG/ML IJ SOLN
1.0000 mg | Freq: Once | INTRAMUSCULAR | Status: AC
  Administered 2019-06-21: 1 mg via INTRAVENOUS
  Filled 2019-06-21: qty 1

## 2019-06-21 MED ORDER — SODIUM CHLORIDE 0.9% FLUSH
10.0000 mL | Freq: Two times a day (BID) | INTRAVENOUS | Status: DC
  Administered 2019-06-22 (×2): 20 mL
  Administered 2019-06-23 – 2019-06-26 (×6): 10 mL

## 2019-06-21 MED ORDER — PROPOFOL 10 MG/ML IV BOLUS
INTRAVENOUS | Status: DC | PRN
  Administered 2019-06-21: 20 mg via INTRAVENOUS

## 2019-06-21 MED ORDER — PROPOFOL 500 MG/50ML IV EMUL
INTRAVENOUS | Status: DC | PRN
  Administered 2019-06-21: 100 ug/kg/min via INTRAVENOUS

## 2019-06-21 SURGICAL SUPPLY — 15 items

## 2019-06-21 NOTE — Progress Notes (Signed)
I spoke to the patient's son, [name], by phone after the procedure to inform them of the findings. All questions were answered to his satisfaction. He thanked me for the call but remain concerned about the unexplained bleeding.  The inpatient GI team follow her and check on her tomorrow. Please call with any questions or concerns in the meantime.

## 2019-06-21 NOTE — Op Note (Signed)
[facility] Patient Name: Maria Clarke Procedure Date : 06/21/2019 MRN: [id number] Attending MD: Thornton Park MD, MD Date of Birth: Nov 14, 1921 CSN: [id number] Age: 85 Admit Type: Inpatient Procedure:                Upper GI endoscopy Indications:              Recurrent Hematemesis 06/20/19. Progressive anemia                            requiring blood transfusion. Known gastric ulcer                            from EGD 06/19/19. Providers:                Thornton Park MD, MD, Carlyn Reichert, RN,                            William Dalton, Technician Referring MD:              Medicines:                Monitored Anesthesia Care Complications:            No immediate complications. Estimated Blood Loss:     Estimated blood loss: none. Procedure:                Pre-Anesthesia Assessment:                           - Prior to the procedure, a History and Physical                            was performed, and patient medications and                            allergies were reviewed. The patient's tolerance of                            previous anesthesia was also reviewed. The risks                            and benefits of the procedure and the sedation                            options and risks were discussed with the patient.                            All questions were answered, and informed consent                            was obtained. Prior Anticoagulants: The patient has                            taken no previous anticoagulant or antiplatelet                            agents. ASA Grade  Assessment: III - A patient with                            severe systemic disease. After reviewing the risks                            and benefits, the patient was deemed in                            satisfactory condition to undergo the procedure.                           After obtaining informed consent, the endoscope was                            passed under  direct vision. Throughout the                            procedure, the patient's blood pressure, pulse, and                            oxygen saturations were monitored continuously. The                            [id number]) Olympus gastroscope was                            introduced through the mouth, and advanced to the                            third part of duodenum. The upper GI endoscopy was                            accomplished without difficulty. The patient                            tolerated the procedure well. Scope In: Scope Out: Findings:      The examined esophagus was normal. No ring, web, stricture, or       esophagitis. The z-line is located 38cm from the incisors. No blood in       the esophagus.      A 2 cm hiatal hernia was present.      One non-bleeding cratered gastric ulcer with no stigmata of bleeding was       found in the prepyloric region of the stomach. The lesion was 15 mm in       largest dimension. The associated edema appears somewhat improved. There       is mild erythema in the atrum. No blood or heme present in the stomach.      The examined duodenum was normal. No blood or heme present in the       duodenum.      The exam was otherwise without abnormality. Impression:               - Normal esophagus.                           -  2 cm hiatal hernia.                           - Non-bleeding gastric ulcer with no stigmata of                            bleeding.                           - Normal examined duodenum.                           - The examination was otherwise normal.                           - Source of recent hematemesis not identified.                            Other sources must be considered. Recommendation:           - Return patient to hospital ward for ongoing care.                           - Continue close monitoring for additional overt                            bleeding.                           - Clear liquid  diet. No red liquids. Advance diet                            as hemoglobin remains stable.                           - Continue present medications including IV PPI.                            Will convert to po when hemoglobin stabilizes.                           - Avoid all NSAIDs as able.                           - Await H pylori stool antigen study (previously                            ordered)                           - Repeat EGD in 10-12 weeks to document healing.                           I updated the patient's sons regarding the                            endoscopy findings by  telephone. All questions were                            answered to their satisfaction. Procedure Code(s):        --- Professional ---                           [phone], Esophagogastroduodenoscopy, flexible,                            transoral; diagnostic, including collection of                            specimen(s) by brushing or washing, when performed                            (separate procedure) Diagnosis Code(s):        --- Professional ---                           K44.9, Diaphragmatic hernia without obstruction or                            gangrene                           K25.9, Gastric ulcer, unspecified as acute or                            chronic, without hemorrhage or perforation                           K92.0, Hematemesis CPT copyright 2019 American [employer] Association. All rights reserved. The codes documented in this report are preliminary and upon coder review may  be revised to meet current compliance requirements. Thornton Park MD, MD 06/21/2019 12:43:51 PM This report has been signed electronically. Number of Addenda: 0

## 2019-06-21 NOTE — Interval H&P Note (Signed)
History and Physical Interval Note:  06/21/2019 12:21 PM  Maria Clarke  has presented today for surgery, with the diagnosis of Hematemesis.  The various methods of treatment have been discussed with the patient and family. After consideration of risks, benefits and other options for treatment, the patient has consented to  Procedure(s): ESOPHAGOGASTRODUODENOSCOPY (EGD) WITH PROPOFOL (N/A) as a surgical intervention.  The patient's history has been reviewed, patient examined, no change in status, stable for surgery.  I have reviewed the patient's chart and labs.  Questions were answered to the patient's satisfaction.     Thornton Park

## 2019-06-21 NOTE — Progress Notes (Signed)
Increased agitation and attempts oob. Now has midline IV placed. Ativan 0.5 mg IV given. Order received for Haldol.

## 2019-06-21 NOTE — Progress Notes (Signed)
PROGRESS NOTE    Maria Clarke  [id number] DOB: 1921/11/22 DOA: 06/18/2019 PCP: [name], NP   Brief Narrative:  Maria Ida Allenis a 85 y.o.femalewithhistory of recent stroke on antiplatelet agents, hypertension, dementia, CAD, hypothyroidism was brought to the ER patient's son found that patient has been throwing up blood since this evening. Over the last 2 days patient was noticed to have some nausea and burping. This evening when she went to the bathroom she was found to be having hematemesis and almost fell onto the floor but patient son caught her but did hit her head. Did not lose consciousness. Following which patient had another episode of throwing up blood. Also had some black stools. EMS was called and patient was found to be hypotensive.  ED Course:Hemoglobin dropped by 6 g of previous of 14 and is presently around 8. Stool for occult blood was positive. Covid test was negative. TFsed  1 unit of PRBC was ordered. CT head is unremarkable except for the expected evolution changes of recent stroke.   Assessment & Plan:   Principal Problem:   Acute GI bleeding Active Problems:   Hypothyroidism   Essential hypertension   Cognitive impairment   CVA (cerebral vascular accident) ([place])   Acute blood loss anemia   Acute gastric ulcer with hemorrhage   Gastric ulcer without hemorrhage or perforation   Acute blood loss anemia. GI bleeding felt to be due to gastric ulcer.  -underwent EGD (12/11): 15 mm gastric ulcer, no bleeding  -Repeat EGD 12/13 no obvious source of bleeding noted -TFsed 2U on 12/11. Repeat Hg 10.7>10.9. cont PPI. Recheck Hg tomorrow. Monitor h/h TF as needed.   History of hypertension. Restarted her home BB. Titrate as needed. May need second agent/amlodpine if uncontrolled   History of recent stroke. hold antiplatelet agents due to acute GI bleed. Will resume plavix in 24-48 hrs if Hg remains stable .  Hypothyroidism. Cont  Synthroid.  History of dementia. Agitation. delirium on 12/11. Much better today. Monitor       DVT prophylaxis: SCD/Compression stockings  Code Status: DNR    Code Status Orders  (From admission, onward)         Start     Ordered   06/18/19 2322  Do not attempt resuscitation (DNR)  Continuous    Question Answer Comment  In the event of cardiac or respiratory ARREST Do not call a "code blue"   In the event of cardiac or respiratory ARREST Do not perform Intubation, CPR, defibrillation or ACLS   In the event of cardiac or respiratory ARREST Use medication by any route, position, wound care, and other measures to relive pain and suffering. May use oxygen, suction and manual treatment of airway obstruction as needed for comfort.      06/18/19 2327        Code Status History    Date Active Date Inactive Code Status Order ID Comments User Context   05/14/2019 1332 05/18/2019 1546 DNR [id number]  Delice Bison, DO ED   05/12/2019 1648 05/13/2019 2008 Full Code [id number]  Delice Bison, DO ED   Advance Care Planning Activity     Family Communication: None today Disposition Plan:   Patient remained inpatient continued monitoring of bleeding, not medically stable for discharge Consults called: None Admission status: Inpatient   Consultants:   GI  Procedures:  CT Head Wo Contrast  Result Date: 06/18/2019 CLINICAL DATA:  Mental status. Headache. Recent stroke. Fall. EXAM: CT HEAD WITHOUT  CONTRAST TECHNIQUE: Contiguous axial images were obtained from the base of the skull through the vertex without intravenous contrast. COMPARISON:  Head CT and brain MRI 05/14/2019 FINDINGS: Brain: Left occipital infarct that was late subacute appearing on imaging last month has undergone expected evolution with developing encephalomalacia. No hemorrhagic transformation. No acute hemorrhage. No evidence of new ischemia. Stable degree of generalized atrophy and moderate to advanced  chronic small vessel ischemia. Subdural or extra-axial collection. No midline shift, mass effect, or hydrocephalus. Vascular: Atherosclerosis of skullbase vasculature without hyperdense vessel or abnormal calcification. Skull: No fracture or focal lesion. Sinuses/Orbits: Paranasal sinuses and mastoid air cells are clear. The visualized orbits are unremarkable. Bilateral lens extraction. Other: None. IMPRESSION: 1. Expected evolution of left occipital infarct with developing encephalomalacia since imaging last month. 2. No new abnormality. Stable atrophy and moderate to advanced chronic small vessel ischemia. Electronically Signed   By: Keith Rake M.D.   On: 06/18/2019 22:08        Subjective: Patient had episode of hematemesis yesterday afternoon with significant blood and blood clots None since  Objective: Vitals:   06/21/19 1241 06/21/19 1245 06/21/19 1300 06/21/19 1323  BP: (!) 105/38 (!) 115/44 (!) 154/50 (!) 171/59  Pulse:    65  Resp:  12 10 12   Temp:    97.8 F (36.6 C)  TempSrc:    Oral  SpO2:  100% 98% 99%  Weight:      Height:        Intake/Output Summary (Last 24 hours) at 06/21/2019 1342 Last data filed at 06/21/2019 1228 Gross per 24 hour  Intake 1530 ml  Output 1150 ml  Net 380 ml   Filed Weights   06/20/19 0512 06/21/19 0640 06/21/19 1133  Weight: 58.9 kg 57.9 kg 57.9 kg    Examination:  General exam: Appears calm and comfortable  Respiratory system: Clear to auscultation. Respiratory effort normal. Cardiovascular system: S1 & S2 heard, RRR. No JVD, murmurs, rubs, gallops or clicks. No pedal edema. Gastrointestinal system: Abdomen is nondistended, soft and nonfocal but mildly tender. No organomegaly or masses felt. Normal bowel sounds heard. Central nervous system: Alert and oriented. No focal neurological deficits. Extremities: Warm well perfused, neurovascular intact Skin: No rashes, lesions or ulcers Psychiatry: Judgement and insight appear  normal. Mood & affect appropriate.     Data Reviewed: I have personally reviewed following labs and imaging studies  CBC: Recent Labs  Lab 06/18/19 2056 06/18/19 2236 06/19/19 [id number] 06/20/19 1044 06/20/19 1543 06/21/19 0558 06/21/19 0633  WBC 9.0 6.5 5.9 6.3  --   --  6.4  NEUTROABS 6.5  --   --   --   --   --   --   HGB 8.9* 7.9* 8.9* 9.5* 7.9* 10.7* 10.9*  HCT 28.0* 24.8* 27.2* 29.1* 24.2* 30.6* 32.4*  MCV 94.0 93.9 90.7 90.7  --   --  90.8  PLT 162 130* 131* 154  --   --  [id number]*   Basic Metabolic Panel: Recent Labs  Lab 06/18/19 2056 06/19/19 0632 06/21/19 0633  NA 143 145 144  K 3.3* 3.3* 3.4*  CL 110 113* 111  CO2 24 23 22   GLUCOSE 142* 110* 98  BUN 13 16 16   CREATININE 0.94 0.71 0.86  CALCIUM 8.3* 8.0* 8.7*   GFR: Estimated Creatinine Clearance: 32.4 mL/min (by C-G formula based on SCr of 0.86 mg/dL). Liver Function Tests: Recent Labs  Lab 06/18/19 2056  AST 32  ALT 18  ALKPHOS 59  BILITOT 1.2  PROT 5.0*  ALBUMIN 2.7*   Recent Labs  Lab 06/18/19 2056  LIPASE 33   No results for input(s): AMMONIA in the last 168 hours. Coagulation Profile: Recent Labs  Lab 06/18/19 2236  INR 1.2   Cardiac Enzymes: No results for input(s): CKTOTAL, CKMB, CKMBINDEX, TROPONINI in the last 168 hours. BNP (last 3 results) No results for input(s): PROBNP in the last 8760 hours. HbA1C: No results for input(s): HGBA1C in the last 72 hours. CBG: Recent Labs  Lab 06/19/19 1204 06/19/19 1933 06/20/19 0004 06/20/19 0640 06/20/19 2320  GLUCAP 86 144* 88 96 102*   Lipid Profile: No results for input(s): CHOL, HDL, LDLCALC, TRIG, CHOLHDL, LDLDIRECT in the last 72 hours. Thyroid Function Tests: No results for input(s): TSH, T4TOTAL, FREET4, T3FREE, THYROIDAB in the last 72 hours. Anemia Panel: No results for input(s): VITAMINB12, FOLATE, FERRITIN, TIBC, IRON, RETICCTPCT in the last 72 hours. Sepsis Labs: Recent Labs  Lab 06/18/19 2045 06/19/19 0133   LATICACIDVEN 2.4* 1.4    Recent Results (from the past 240 hour(s))  SARS CORONAVIRUS 2 (TAT 6-24 HRS) Nasopharyngeal Nasopharyngeal Swab     Status: None   Collection Time: 06/18/19 10:48 PM   Specimen: Nasopharyngeal Swab  Result Value Ref Range Status   SARS Coronavirus 2 NEGATIVE NEGATIVE Final    Comment: (NOTE) SARS-CoV-2 target nucleic acids are NOT DETECTED. The SARS-CoV-2 RNA is generally detectable in upper and lower respiratory specimens during the acute phase of infection. Negative results do not preclude SARS-CoV-2 infection, do not rule out co-infections with other pathogens, and should not be used as the sole basis for treatment or other patient management decisions. Negative results must be combined with clinical observations, patient history, and epidemiological information. The expected result is Negative. Fact Sheet for Patients: SugarRoll.be Fact Sheet for Healthcare Providers: https://www.woods-mathews.com/ This test is not yet approved or cleared by the [place] FDA and  has been authorized for detection and/or diagnosis of SARS-CoV-2 by FDA under an Emergency Use Authorization (EUA). This EUA will remain  in effect (meaning this test can be used) for the duration of the COVID-19 declaration under Section 56 4(b)(1) of the Act, 21 U.S.C. section 360bbb-3(b)(1), unless the authorization is terminated or revoked sooner. Performed at [facility], [place] [address] [place], [place] [address]          Radiology Studies: No results found.      Scheduled Meds: . sodium chloride   Intravenous Once  . sodium chloride   Intravenous Once  . levothyroxine  88 mcg Oral Q0600  . metoprolol tartrate  50 mg Oral BID  . [START ON 06/24/2019] pantoprazole  40 mg Intravenous Q12H   Continuous Infusions: . pantoprozole (PROTONIX) infusion 8 mg/hr (06/21/19 1032)     LOS: 3 days    Time spent: 36  min    Nicolette Bang, MD [facility]  If 7PM-7AM, please contact night-coverage  06/21/2019, 1:42 PM

## 2019-06-21 NOTE — Transfer of Care (Signed)
Immediate Anesthesia Transfer of Care Note  Patient: Maria Clarke  Procedure(s) Performed: ESOPHAGOGASTRODUODENOSCOPY (EGD) WITH PROPOFOL (N/A )  Patient Location: Endoscopy Unit  Anesthesia Type:MAC  Level of Consciousness: awake  Airway & Oxygen Therapy: Patient Spontanous Breathing and Patient connected to nasal cannula oxygen  Post-op Assessment: Report given to RN and Post -op Vital signs reviewed and stable  Post vital signs: Reviewed and stable  Last Vitals:  Vitals Value Taken Time  BP    Temp    Pulse    Resp    SpO2      Last Pain:  Vitals:   06/21/19 1133  TempSrc: Temporal  PainSc: 0-No pain      Patients Stated Pain Goal: 0 (28/11/88 6773)  Complications: No apparent anesthesia complications

## 2019-06-21 NOTE — Anesthesia Postprocedure Evaluation (Signed)
Anesthesia Post Note  Patient: Maria Clarke  Procedure(s) Performed: ESOPHAGOGASTRODUODENOSCOPY (EGD) WITH PROPOFOL (N/A )     Patient location during evaluation: PACU Anesthesia Type: MAC Level of consciousness: awake Pain management: pain level controlled Vital Signs Assessment: post-procedure vital signs reviewed and stable Respiratory status: spontaneous breathing, nonlabored ventilation, respiratory function stable and patient connected to nasal cannula oxygen Cardiovascular status: stable and blood pressure returned to baseline Postop Assessment: no apparent nausea or vomiting Anesthetic complications: no    Last Vitals:  Vitals:   06/21/19 1404 06/21/19 1419  BP: (!) 135/123 (!) 145/56  Pulse: 71 66  Resp: 20 17  Temp:    SpO2: 99% 97%    Last Pain:  Vitals:   06/21/19 1323  TempSrc: Oral  PainSc:                  Karyl Kinnier [name]

## 2019-06-21 NOTE — Anesthesia Preprocedure Evaluation (Addendum)
Anesthesia Evaluation  Patient identified by MRN, date of birth, ID bandGeneral Assessment Comment:Responds to stimulus  Reviewed: Allergy & Precautions, NPO status , Patient's Chart, lab work & pertinent test results  Airway Mallampati: II  TM Distance: >3 FB Neck ROM: Full    Dental  (+) Missing   Pulmonary neg pulmonary ROS,    Pulmonary exam normal breath sounds clear to auscultation       Cardiovascular hypertension, Pt. on home beta blockers Normal cardiovascular exam Rhythm:Regular Rate:Normal  ECG: rate 71. Normal sinus rhythm Left axis deviation Right bundle branch block  ECHO: 1. Left ventricular ejection fraction, by visual estimation, is 65 to 70%. The left ventricle has normal function. Left ventricular septal wall thickness was moderately increased. Moderately increased left ventricular posterior wall thickness. There is no left ventricular hypertrophy. 2. Left ventricular diastolic parameters are consistent with Grade I diastolic dysfunction (impaired relaxation). 3. Global right ventricle has normal systolic function.The right ventricular size is normal. No increase in right ventricular wall thickness. 4. Left atrial size was normal. 5. Right atrial size was normal. 6. Severe aortic valve annular calcification. 7. The mitral valve is normal in structure. No evidence of mitral valve regurgitation. No evidence of mitral stenosis. 8. The tricuspid valve is normal in structure. Tricuspid valve regurgitation is not demonstrate 9. The aortic valve is tricuspid. There is Moderate thickening of the aortic valve. There is Severe calcifcation of the aortic valve. Aortic valve regurgitation is mild to moderate. Mild to moderate aortic valve stenosis. Aortic regurgitation PHT measures 365 msec. Aortic valve mean gradient measures 14.2 mmHg. Aortic valve peak gradient measures 25.2 mmHg. Aortic valve area, by VTI measures 1.36  cm. 10. The pulmonic valve was normal in structure. Pulmonic valve regurgitation is trivial. 11. The inferior vena cava is normal in size with greater than 50% respiratory variability, suggesting right atrial pressure of 3 mmHg.   Neuro/Psych PSYCHIATRIC DISORDERS Depression Dementia TIACVA    GI/Hepatic negative GI ROS, Neg liver ROS,   Endo/Other  Hypothyroidism   Renal/GU negative Renal ROS     Musculoskeletal negative musculoskeletal ROS (+)   Abdominal   Peds  Hematology  (+) anemia , HLD   Anesthesia Other Findings Hematemesis  Reproductive/Obstetrics                            Anesthesia Physical Anesthesia Plan  ASA: III  Anesthesia Plan: MAC   Post-op Pain Management:    Induction: Intravenous  PONV Risk Score and Plan: 2 and Propofol infusion and Treatment may vary due to age or medical condition  Airway Management Planned: Nasal Cannula  Additional Equipment:   Intra-op Plan:   Post-operative Plan:   Informed Consent: I have reviewed the patients History and Physical, chart, labs and discussed the procedure including the risks, benefits and alternatives for the proposed anesthesia with the patient or authorized representative who has indicated his/her understanding and acceptance.       Plan Discussed with: CRNA  Anesthesia Plan Comments:        Anesthesia Quick Evaluation

## 2019-06-22 ENCOUNTER — Telehealth: Payer: Self-pay

## 2019-06-22 DIAGNOSIS — E039 Hypothyroidism, unspecified: Secondary | ICD-10-CM

## 2019-06-22 DIAGNOSIS — K922 Gastrointestinal hemorrhage, unspecified: Secondary | ICD-10-CM

## 2019-06-22 LAB — BPAM RBC
Blood Product Expiration Date: 202101112359
Blood Product Expiration Date: 202101112359
Blood Product Expiration Date: 202101122359
ISSUE DATE / TIME: 202012102342
ISSUE DATE / TIME: 202012121751
ISSUE DATE / TIME: 202012130028
Unit Type and Rh: 5100
Unit Type and Rh: 5100
Unit Type and Rh: 5100

## 2019-06-22 LAB — TYPE AND SCREEN
ABO/RH(D): O POS
Antibody Screen: NEGATIVE
Unit division: 0
Unit division: 0
Unit division: 0

## 2019-06-22 LAB — HEMOGLOBIN AND HEMATOCRIT, BLOOD
HCT: 30.9 % — ABNORMAL LOW (ref 36.0–46.0)
HCT: 32.5 % — ABNORMAL LOW (ref 36.0–46.0)
HCT: 31.8 % — ABNORMAL LOW (ref 36.0–46.0)
Hemoglobin: 10.2 g/dL — ABNORMAL LOW (ref 12.0–15.0)
Hemoglobin: 11 g/dL — ABNORMAL LOW (ref 12.0–15.0)
Hemoglobin: 10.5 g/dL — ABNORMAL LOW (ref 12.0–15.0)

## 2019-06-22 LAB — GLUCOSE, CAPILLARY: Glucose-Capillary: 74 mg/dL (ref 70–99)

## 2019-06-22 MED ORDER — OLANZAPINE 10 MG IM SOLR
2.5000 mg | Freq: Once | INTRAMUSCULAR | Status: AC | PRN
  Administered 2019-06-22: 19:00:00 2.5 mg via INTRAMUSCULAR
  Filled 2019-06-22: qty 10

## 2019-06-22 MED ORDER — PANTOPRAZOLE SODIUM 40 MG IV SOLR
40.0000 mg | Freq: Two times a day (BID) | INTRAVENOUS | Status: DC
  Administered 2019-06-22 – 2019-06-26 (×8): 40 mg via INTRAVENOUS
  Filled 2019-06-22 (×10): qty 40

## 2019-06-22 MED ORDER — POTASSIUM CHLORIDE 20 MEQ PO PACK
40.0000 meq | PACK | Freq: Once | ORAL | Status: AC
  Administered 2019-06-22: 23:00:00 40 meq via ORAL
  Filled 2019-06-22: qty 2

## 2019-06-22 NOTE — Progress Notes (Signed)
PROGRESS NOTE    Maria Clarke  [id number]  DOB: Jan 23, 1922  PCP: [name], NP Admit date:06/18/2019  85 y.o.femalewithhistory of recent stroke on antiplatelet agents, hypertension, dementia, CAD, hypothyroidism was brought to the ER patient's son found that patient has been throwing up blood associated with some nausea and burping noted by family members for 2 days prior to admission.  On the evening of presentation, when she went to the bathroom she was found to be having hematemesis and almost fell onto the floor but patient's son caught her. Did not lose consciousness. Following this episode, patient had another episode of throwing up blood with reported black stools. EMS was called and patient was found to be hypotensive ED Course: Afebrile.Hemoglobin dropped by 6 g of previous of 14 and is presently around 8. Stool for occult blood was positive. Covid test was negative. TFsed 1 unit of PRBC. CT head is unremarkable except for the expected evolution changes of recent stroke. Hospital course: Patient admitted to [facility] with GI consult. Plavix held. GI bleedingfelt to be due to gastric ulcer. -underwent EGD (12/11): 15 mm gastric ulcer, no bleeding -Repeat EGD 12/13 no obvious source of bleeding noted-TFsed 2U on 12/11. Repeat Hg 10.7>10.9.cont PPI. HC complicated by delirium, now improved.  Subjective:  Patient been delirious and pulling on IV lines.  Has sitter at bedside.  On full liquid diet but barely eating per sitter.  Was sedated earlier in the day as she received morphine/Haldol overnight for confusion/agitation.  She was sleeping but easily arousable, pleasantly confused during my exam  Objective: Vitals:   06/21/19 1349 06/21/19 1404 06/21/19 1419 06/21/19 1958  BP: (!) 151/53 (!) 135/123 (!) 145/56 (!) 133/97  Pulse: 75 71 66 (!) 108  Resp: 13 20 17 18   Temp:    98.3 F (36.8 C)  TempSrc:    Axillary  SpO2: 98% 99% 97% 100%  Weight:      Height:         Intake/Output Summary (Last 24 hours) at 06/22/2019 1000 Last data filed at 06/22/2019 0225 Gross per 24 hour  Intake 620 ml  Output 700 ml  Net -80 ml   Filed Weights   06/20/19 0512 06/21/19 0640 06/21/19 1133  Weight: 58.9 kg 57.9 kg 57.9 kg    Physical Examination:  General exam: Appears calm and comfortable, pleasantly confused Respiratory system: Clear to auscultation. Respiratory effort normal. Cardiovascular system: S1 & S2 heard, RRR. No JVD, murmurs, rubs, gallops or clicks. No pedal edema. Gastrointestinal system: Abdomen is nondistended, soft and nontender. Normal bowel sounds heard. Central nervous system: Somnolent, but easily arousable and oriented x1. No new focal neurological deficits. Extremities: No contractures, edema or joint deformities.  Skin: No rashes, lesions or ulcers Psychiatry: Judgement and insight appear impaired.   Data Reviewed: I have personally reviewed following labs and imaging studies  CBC: Recent Labs  Lab 06/18/19 2056 06/18/19 2236 06/19/19 [id number] 06/20/19 1044 06/21/19 0558 06/21/19 0633 06/21/19 1509 06/21/19 2227 06/22/19 0739  WBC 9.0 6.5 5.9 6.3  --  6.4  --   --   --   NEUTROABS 6.5  --   --   --   --   --   --   --   --   HGB 8.9* 7.9* 8.9* 9.5* 10.7* 10.9* 10.5* 10.6* 10.2*  HCT 28.0* 24.8* 27.2* 29.1* 30.6* 32.4* 30.0* 31.1* 30.9*  MCV 94.0 93.9 90.7 90.7  --  90.8  --   --   --  PLT 162 130* 131* 154  --  143*  --   --   --    Basic Metabolic Panel: Recent Labs  Lab 06/18/19 2056 06/19/19 0632 06/21/19 0633  NA 143 145 144  K 3.3* 3.3* 3.4*  CL 110 113* 111  CO2 24 23 22   GLUCOSE 142* 110* 98  BUN 13 16 16   CREATININE 0.94 0.71 0.86  CALCIUM 8.3* 8.0* 8.7*   GFR: Estimated Creatinine Clearance: 32.4 mL/min (by C-G formula based on SCr of 0.86 mg/dL). Liver Function Tests: Recent Labs  Lab 06/18/19 2056  AST 32  ALT 18  ALKPHOS 59  BILITOT 1.2  PROT 5.0*  ALBUMIN 2.7*   Recent Labs  Lab  06/18/19 2056  LIPASE 33   No results for input(s): AMMONIA in the last 168 hours. Coagulation Profile: Recent Labs  Lab 06/18/19 2236  INR 1.2   Cardiac Enzymes: No results for input(s): CKTOTAL, CKMB, CKMBINDEX, TROPONINI in the last 168 hours. BNP (last 3 results) No results for input(s): PROBNP in the last 8760 hours. HbA1C: No results for input(s): HGBA1C in the last 72 hours. CBG: Recent Labs  Lab 06/19/19 1204 06/19/19 1933 06/20/19 0004 06/20/19 0640 06/20/19 2320  GLUCAP 86 144* 88 96 102*   Lipid Profile: No results for input(s): CHOL, HDL, LDLCALC, TRIG, CHOLHDL, LDLDIRECT in the last 72 hours. Thyroid Function Tests: No results for input(s): TSH, T4TOTAL, FREET4, T3FREE, THYROIDAB in the last 72 hours. Anemia Panel: No results for input(s): VITAMINB12, FOLATE, FERRITIN, TIBC, IRON, RETICCTPCT in the last 72 hours. Sepsis Labs: Recent Labs  Lab 06/18/19 2045 06/19/19 0133  LATICACIDVEN 2.4* 1.4    Recent Results (from the past 240 hour(s))  SARS CORONAVIRUS 2 (TAT 6-24 HRS) Nasopharyngeal Nasopharyngeal Swab     Status: None   Collection Time: 06/18/19 10:48 PM   Specimen: Nasopharyngeal Swab  Result Value Ref Range Status   SARS Coronavirus 2 NEGATIVE NEGATIVE Final    Comment: (NOTE) SARS-CoV-2 target nucleic acids are NOT DETECTED. The SARS-CoV-2 RNA is generally detectable in upper and lower respiratory specimens during the acute phase of infection. Negative results do not preclude SARS-CoV-2 infection, do not rule out co-infections with other pathogens, and should not be used as the sole basis for treatment or other patient management decisions. Negative results must be combined with clinical observations, patient history, and epidemiological information. The expected result is Negative. Fact Sheet for Patients: SugarRoll.be Fact Sheet for Healthcare Providers: https://www.woods-mathews.com/ This test  is not yet approved or cleared by the [place] FDA and  has been authorized for detection and/or diagnosis of SARS-CoV-2 by FDA under an Emergency Use Authorization (EUA). This EUA will remain  in effect (meaning this test can be used) for the duration of the COVID-19 declaration under Section 56 4(b)(1) of the Act, 21 U.S.C. section 360bbb-3(b)(1), unless the authorization is terminated or revoked sooner. Performed at [facility], [place] [address], [place], [place] [address]       Radiology Studies: No results found.      Scheduled Meds: . sodium chloride   Intravenous Once  . sodium chloride   Intravenous Once  . levothyroxine  88 mcg Oral Q0600  . metoprolol tartrate  50 mg Oral BID  . [START ON 06/24/2019] pantoprazole  40 mg Intravenous Q12H  . sodium chloride flush  10-40 mL Intracatheter Q12H   Continuous Infusions: . pantoprozole (PROTONIX) infusion 8 mg/hr (06/21/19 2305)    Assessment & Plan:  1.  Upper GI bleed: Patient had hematemesis/melena on presentation with acute blood loss anemia.  EGD x2 revealed nonbleeding gastric ulcer likely related to aspirin use.  Required 2 units of PRBC in this hospitalization.  Hemoglobin stabilized around 10.  Patient had CVA last month with ASA increase from 81 mg to 325 mg at that time as well as addition to Plavix. Last Plavix dose was 12/10.  Okay to resume Plavix in a.m. per GI.  Continue Protonix 40 mg twice daily for 8 weeks.  Follow-up H. pylori stool antigen results.  Diet advanced to full liquids but patient not been eating due to confusion/sedation.Could consider repeat endoscopy in 10-12 weeks vs outpatient monitoring of hemoglobin with repeat endoscopy if any decline per GI and also plan to repeat CBC 7-10 days after discharge to assess for stability.  2.  Dementia/delirium: In the setting of acute blood loss anemia, recent stroke and hospitalization.  Previous EKG showed prolonged QTC.  Will repeat EKG today.   Zyprexa x1 ordered this afternoon for persistent confusion and pulling on lines.  Avoid benzodiazepines in this elderly patient.  3.  History of CVA: Hold aspirin in view of problem #1.  Can resume Plavix in a.m. per GI.?  Not on statins.  4. History of hypertension. Restarted her home BB. Titrate as needed. May need second agent/amlodpine if uncontrolled.  Treat delirium.  SBP improved to 130s today (was up to 170)  5. Hypothyroidism. Cont Synthroid  DVT prophylaxis: SCDs in view of GI bleed Code Status: DNR Family / Patient Communication: Family not present at bedside. Disposition Plan: Likely home once delirium improves.  PT eval once mental status improves     LOS: 4 days    Time spent: 35 minutes    Guilford Shi, MD [facility] Pager [phone]  If 7PM-7AM, please contact night-coverage [email or web address] Password [email or web address] 06/22/2019, 10:00 AM

## 2019-06-22 NOTE — Telephone Encounter (Signed)
07/22/19 at 130 pm appt with Alonza Bogus. Letter mailed to the pt with appt info

## 2019-06-22 NOTE — Consult Note (Signed)
   [facility] CM Inpatient Consult   06/22/2019  Maria Clarke Jul 25, 1921 [id number]   Patient screened for potential [facility] follow up services.   Review of patient's medical record reveals patient is has had attempted follow up calls from a [facility] Stroke team referral however, the RN Care Coordinator was unable to reach according to notes.  Current chart review reveals Patient in the Medicare ACO admitted with acute blood loss anemia.    Plan:  Will follow up progress and disposition to assess for post hospital care management needs, if appropriate.    Please place a [facility] Care Management consult as appropriate and for questions contact:   Natividad Brood, RN BSN [facility] Liaison  [phone] business mobile phone Toll free office [phone]  Fax number: [phone] [email or web address]

## 2019-06-22 NOTE — Progress Notes (Signed)
     [facility] Progress Note  CC:  GI bleeding, gastric ulcer  Subjective:  Had repeat EGD yesterday for recurrent hematemesis.  No new issues found, non-bleeding GU seen.  No BM, no further hematemesis.  Apparently had a lot of agitation overnight so has a Actuary.  Hgb 10.2 grams this AM.  Objective:  Vital signs in last 24 hours: Temp:  [97.7 F (36.5 C)-98.3 F (36.8 C)] 98.3 F (36.8 C) (12/13 1958) Pulse Rate:  [65-108] 108 (12/13 1958) Resp:  [10-20] 18 (12/13 1958) BP: (80-171)/(30-123) 133/97 (12/13 1958) SpO2:  [97 %-100 %] 100 % (12/13 1958) Weight:  [57.9 kg] 57.9 kg (12/13 1133) Last BM Date: (unknown) General:  Alert, in NAD Heart:  Somewhat tachy. Pulm:  CTAB.  No increased WOB. Abdomen:  Soft, non-distended.  BS present.  Non-tender. Extremities:  Without edema.  Intake/Output from previous day: 12/13 0701 - 12/14 0700 In: 620 [I.V.:620] Out: 1100 [Urine:1100]  Lab Results: Recent Labs    06/20/19 1044 06/21/19 0633 06/21/19 1509 06/21/19 2227 06/22/19 0739  WBC 6.3 6.4  --   --   --   HGB 9.5* 10.9* 10.5* 10.6* 10.2*  HCT 29.1* 32.4* 30.0* 31.1* 30.9*  PLT 154 143*  --   --   --    BMET Recent Labs    06/21/19 0633  NA 144  K 3.4*  CL 111  CO2 22  GLUCOSE 98  BUN 16  CREATININE 0.86  CALCIUM 8.7*   Assessment / Plan: *Gastric ulcer bleed with associated GI blood loss anemia likely due to ASA    - has required 2 units of PRBCs.  Hgb stable this AM at 10.2 grams.    - EGD 06/19/19: 57mm non-bleeding cratered GU with no stigmata of bleeding.  Repeat EGD on 12/13 confirmed the same.   *CVA last month with ASA increase from 81 mg to 325 mg at that time as well as addition to Plavix.  Last Plavix dose was 12/10. *COVID negative  -Protonix 40 mg BID x 8 weeks. -Avoid NSAIDs if possible. -Await H pylori stool antigen results, pending. -? When ok to resume Plavix. -Will advance to full liquids and can be advanced from there  later if still no sign of bleeding. -Will discontinue PPI gtt and place on IV BID.  Can be switched to PO upon discharge.   LOS: 4 days   Laban Emperor. Dajon Lazar  06/22/2019, 9:07 AM

## 2019-06-22 NOTE — Telephone Encounter (Signed)
-----   Message from Loralie Champagne, PA-C sent at 06/22/2019  4:24 PM EST ----- Can you schedule this patient for follow-up with me in 2-4 weeks?  Hospital follow-up GI bleed, gastric ulcer.  They will need to be made aware of the appt.  Thank you,  Jess  ----- Message ----- From: Lavena Bullion, DO Sent: 06/22/2019   4:07 PM EST To: Loralie Champagne, PA-C  I signed off this patient today.  Can you outpatient follow-up for this patient, approximately 2-4 weeks after discharge?  Thank you.

## 2019-06-22 NOTE — Progress Notes (Signed)
Pt apparently combative at this time. Pt had pinched her sitter before she was relieved by another sitter. New sitter had been kicked in her shin x 2 since she had arrived at 3 PM today. Lorazepam 0.5 mg IV given as per order.

## 2019-06-23 ENCOUNTER — Other Ambulatory Visit: Payer: Self-pay | Admitting: Adult Health

## 2019-06-23 DIAGNOSIS — E876 Hypokalemia: Secondary | ICD-10-CM

## 2019-06-23 DIAGNOSIS — R9431 Abnormal electrocardiogram [ECG] [EKG]: Secondary | ICD-10-CM

## 2019-06-23 DIAGNOSIS — I10 Essential (primary) hypertension: Secondary | ICD-10-CM

## 2019-06-23 LAB — BASIC METABOLIC PANEL
Anion gap: 11 (ref 5–15)
BUN: 12 mg/dL (ref 8–23)
CO2: 21 mmol/L — ABNORMAL LOW (ref 22–32)
Calcium: 8.5 mg/dL — ABNORMAL LOW (ref 8.9–10.3)
Chloride: 110 mmol/L (ref 98–111)
Creatinine, Ser: 0.8 mg/dL (ref 0.44–1.00)
GFR calc Af Amer: >60 mL/min (ref >60)
GFR calc non Af Amer: >60 mL/min (ref >60)
Glucose, Bld: 92 mg/dL (ref 70–99)
Potassium: 3 mmol/L — ABNORMAL LOW (ref 3.5–5.1)
Sodium: 142 mmol/L (ref 135–145)

## 2019-06-23 LAB — GLUCOSE, CAPILLARY: Glucose-Capillary: 70 mg/dL (ref 70–99)

## 2019-06-23 LAB — HEMOGLOBIN AND HEMATOCRIT, BLOOD
HCT: 31 % — ABNORMAL LOW (ref 36.0–46.0)
Hemoglobin: 10.4 g/dL — ABNORMAL LOW (ref 12.0–15.0)

## 2019-06-23 MED ORDER — DONEPEZIL HCL 5 MG PO TABS
5.0000 mg | ORAL_TABLET | Freq: Every day | ORAL | Status: DC
  Administered 2019-06-23 – 2019-07-02 (×9): 5 mg via ORAL
  Filled 2019-06-23 (×9): qty 1

## 2019-06-23 MED ORDER — MAGNESIUM SULFATE 2 GM/50ML IV SOLN
2.0000 g | Freq: Once | INTRAVENOUS | Status: AC
  Administered 2019-06-23: 2 g via INTRAVENOUS
  Filled 2019-06-23: qty 50

## 2019-06-23 MED ORDER — ESCITALOPRAM OXALATE 10 MG PO TABS
5.0000 mg | ORAL_TABLET | Freq: Every day | ORAL | Status: DC
  Administered 2019-06-23 – 2019-07-02 (×9): 5 mg via ORAL
  Filled 2019-06-23 (×9): qty 1

## 2019-06-23 MED ORDER — POTASSIUM CHLORIDE 10 MEQ/100ML IV SOLN
10.0000 meq | INTRAVENOUS | Status: AC
  Administered 2019-06-23 (×2): 10 meq via INTRAVENOUS
  Filled 2019-06-23 (×2): qty 100

## 2019-06-23 MED ORDER — MORPHINE SULFATE (PF) 2 MG/ML IV SOLN: 1.0000 mg | INTRAVENOUS | Status: DC | PRN

## 2019-06-23 MED ORDER — POTASSIUM CHLORIDE 20 MEQ PO PACK
40.0000 meq | PACK | Freq: Once | ORAL | Status: DC
  Filled 2019-06-23: qty 2

## 2019-06-23 MED ORDER — MORPHINE SULFATE (PF) 2 MG/ML IV SOLN
1.0000 mg | INTRAVENOUS | Status: DC | PRN
  Administered 2019-06-23 – 2019-06-25 (×4): 2 mg via INTRAVENOUS
  Filled 2019-06-23 (×4): qty 1

## 2019-06-23 MED ORDER — ATORVASTATIN CALCIUM 80 MG PO TABS
80.0000 mg | ORAL_TABLET | Freq: Every day | ORAL | Status: DC
  Administered 2019-06-24 – 2019-07-01 (×6): 80 mg via ORAL
  Filled 2019-06-23 (×8): qty 1

## 2019-06-23 NOTE — Progress Notes (Addendum)
PROGRESS NOTE    COSBY PISCIOTTA  [id number]  DOB: Aug 08, 1921  PCP: [name], NP Admit date:06/18/2019  85 y.o.femalewithhistory of recent stroke on antiplatelet agents, hypertension, dementia, CAD, hypothyroidism was brought to the ER patient's son found that patient has been throwing up blood associated with some nausea and burping noted by family members for 2 days prior to admission.  On the evening of presentation, when she went to the bathroom she was found to be having hematemesis and almost fell onto the floor but patient's son caught her. Did not lose consciousness. Following this episode, patient had another episode of throwing up blood with reported black stools. EMS was called and patient was found to be hypotensive ED Course: Afebrile.Hemoglobin dropped by 6 g of previous of 14 and is presently around 8. Stool for occult blood was positive. Covid test was negative. TFsed 1 unit of PRBC. CT head is unremarkable except for the expected evolution changes of recent stroke. Hospital course: Patient admitted to [facility] with GI consult. Plavix held. GI bleedingfelt to be due to gastric ulcer. -underwent EGD (12/11): 15 mm gastric ulcer, no bleeding -Repeat EGD 12/13 no obvious source of bleeding noted-TFsed 2U on 12/11. Repeat Hg 10.7>10.9.cont PPI. HC complicated by delirium requiring sitter at bedside.  Subjective: Patient somewhat better today per bedside sitter.  Ate about 20% of full liquid diet.  She is currently pleasantly confused.  There was report of maroon-colored BM around 2 AM.  None noted by morning nurse.  Objective: Vitals:   06/22/19 1955 06/22/19 2306 06/23/19 0647 06/23/19 0842  BP: (!) 162/79 (!) 147/59  132/90  Pulse: 99 75  79  Resp: 20   14  Temp: 98.1 F (36.7 C)   98.1 F (36.7 C)  TempSrc: Oral   Axillary  SpO2: 99%   100%  Weight:   57.2 kg   Height:        Intake/Output Summary (Last 24 hours) at 06/23/2019 1757 Last data filed at  06/23/2019 0912 Gross per 24 hour  Intake 220 ml  Output 250 ml  Net -30 ml   Filed Weights   06/21/19 0640 06/21/19 1133 06/23/19 0647  Weight: 57.9 kg 57.9 kg 57.2 kg    Physical Examination:  General exam: Appears calm and comfortable, pleasantly confused Respiratory system: Clear to auscultation. Respiratory effort normal. Cardiovascular system: S1 & S2 heard, RRR. No JVD, murmurs, rubs, gallops or clicks. No pedal edema. Gastrointestinal system: Abdomen is nondistended, soft and nontender. Normal bowel sounds heard. Central nervous system: Somnolent, but easily arousable and oriented x1. No new focal neurological deficits. Extremities: No contractures, edema or joint deformities.  Skin: No rashes, lesions or ulcers Psychiatry: Judgement and insight appear impaired.   Data Reviewed: I have personally reviewed following labs and imaging studies  CBC: Recent Labs  Lab 06/18/19 2056 06/18/19 2236 06/19/19 0632 06/20/19 1044 06/21/19 0633 06/21/19 2227 06/22/19 0739 06/22/19 1432 06/22/19 2314 06/23/19 1003  WBC 9.0 6.5 5.9 6.3 6.4  --   --   --   --   --   NEUTROABS 6.5  --   --   --   --   --   --   --   --   --   HGB 8.9* 7.9* 8.9* 9.5* 10.9* 10.6* 10.2* 11.0* 10.5* 10.4*  HCT 28.0* 24.8* 27.2* 29.1* 32.4* 31.1* 30.9* 32.5* 31.8* 31.0*  MCV 94.0 93.9 90.7 90.7 90.8  --   --   --   --   --  PLT 162 130* 131* 154 143*  --   --   --   --   --    Basic Metabolic Panel: Recent Labs  Lab 06/18/19 2056 06/19/19 0632 06/21/19 0633 06/23/19 1003  NA 143 145 144 142  K 3.3* 3.3* 3.4* 3.0*  CL 110 113* 111 110  CO2 24 23 22  21*  GLUCOSE 142* 110* 98 92  BUN 13 16 16 12   CREATININE 0.94 0.71 0.86 0.80  CALCIUM 8.3* 8.0* 8.7* 8.5*   GFR: Estimated Creatinine Clearance: 34.8 mL/min (by C-G formula based on SCr of 0.8 mg/dL). Liver Function Tests: Recent Labs  Lab 06/18/19 2056  AST 32  ALT 18  ALKPHOS 59  BILITOT 1.2  PROT 5.0*  ALBUMIN 2.7*   Recent  Labs  Lab 06/18/19 2056  LIPASE 33   No results for input(s): AMMONIA in the last 168 hours. Coagulation Profile: Recent Labs  Lab 06/18/19 2236  INR 1.2   Cardiac Enzymes: No results for input(s): CKTOTAL, CKMB, CKMBINDEX, TROPONINI in the last 168 hours. BNP (last 3 results) No results for input(s): PROBNP in the last 8760 hours. HbA1C: No results for input(s): HGBA1C in the last 72 hours. CBG: Recent Labs  Lab 06/20/19 0004 06/20/19 0640 06/20/19 2320 06/22/19 2318 06/23/19 1205  GLUCAP 88 96 102* 74 70   Lipid Profile: No results for input(s): CHOL, HDL, LDLCALC, TRIG, CHOLHDL, LDLDIRECT in the last 72 hours. Thyroid Function Tests: No results for input(s): TSH, T4TOTAL, FREET4, T3FREE, THYROIDAB in the last 72 hours. Anemia Panel: No results for input(s): VITAMINB12, FOLATE, FERRITIN, TIBC, IRON, RETICCTPCT in the last 72 hours. Sepsis Labs: Recent Labs  Lab 06/18/19 2045 06/19/19 0133  LATICACIDVEN 2.4* 1.4    Recent Results (from the past 240 hour(s))  SARS CORONAVIRUS 2 (TAT 6-24 HRS) Nasopharyngeal Nasopharyngeal Swab     Status: None   Collection Time: 06/18/19 10:48 PM   Specimen: Nasopharyngeal Swab  Result Value Ref Range Status   SARS Coronavirus 2 NEGATIVE NEGATIVE Final    Comment: (NOTE) SARS-CoV-2 target nucleic acids are NOT DETECTED. The SARS-CoV-2 RNA is generally detectable in upper and lower respiratory specimens during the acute phase of infection. Negative results do not preclude SARS-CoV-2 infection, do not rule out co-infections with other pathogens, and should not be used as the sole basis for treatment or other patient management decisions. Negative results must be combined with clinical observations, patient history, and epidemiological information. The expected result is Negative. Fact Sheet for Patients: SugarRoll.be Fact Sheet for Healthcare  Providers: https://www.woods-mathews.com/ This test is not yet approved or cleared by the [place] FDA and  has been authorized for detection and/or diagnosis of SARS-CoV-2 by FDA under an Emergency Use Authorization (EUA). This EUA will remain  in effect (meaning this test can be used) for the duration of the COVID-19 declaration under Section 56 4(b)(1) of the Act, 21 U.S.C. section 360bbb-3(b)(1), unless the authorization is terminated or revoked sooner. Performed at [facility], [place] [address], [place], [place] [address]       Radiology Studies: No results found.      Scheduled Meds: . sodium chloride   Intravenous Once  . sodium chloride   Intravenous Once  . levothyroxine  88 mcg Oral Q0600  . metoprolol tartrate  50 mg Oral BID  . pantoprazole (PROTONIX) IV  40 mg Intravenous Q12H  . sodium chloride flush  10-40 mL Intracatheter Q12H   Continuous Infusions:   Assessment &  Plan:   1.  Upper GI bleed: Patient had hematemesis/melena on presentation with acute blood loss anemia.  EGD x2 revealed nonbleeding gastric ulcer likely related to aspirin use.  Required 2 units of PRBC in this hospitalization.  Hemoglobin stabilized around 10.  Patient had CVA last month with ASA increase from 81 mg to 325 mg at that time as well as addition to Plavix. Last Plavix dose was 12/10.  Okay to resume Plavix today per GI but concern for "maroon stool" reported overnight.  Hemoglobin appears stable this morning.  Would ensure stable hemoglobin in a.m. prior to resuming Plavix.  Continue Protonix 40 mg twice daily for 8 weeks.  Follow-up H. pylori stool antigen results.  Diet advanced to full liquids but patient not been eating due to confusion/intermittent sedation.Could consider repeat endoscopy in 10-12 weeks vs outpatient monitoring of hemoglobin with repeat endoscopy if any decline per GI and also plan to repeat CBC 7-10 days after discharge to assess for  stability.  2.  Dementia/delirium: In the setting of acute blood loss anemia, recent stroke and hospitalization.  EKG shows prolonged QTC at 516 MS.  She received Zyprexa x1 yesterday but would avoid any QT prolonging agents (higher risk with Haldol/Seroquel/risperidone).  Continue sitter and delirium precautions.  Appears to be pleasantly confused this morning.  Avoid benzodiazepines in this elderly patient.  Can add melatonin if available through pharmacy will resume home dose of Lexapro/Aricept.  According to son patient only has memory issues at baseline.  3.  History of CVA: Hold aspirin in view of problem #1.  Can resume Plavix in a.m. if hgb stable.    Resumed home statins   4. History of hypertension. Restarted her home BB. Titrate as needed. May need second agent/amlodpine if uncontrolled.  Treat delirium.  SBP improved to 130s today (was up to 170)  5. Hypothyroidism. Cont Synthroid  6. Prolonged QT : Will give one dose magnesium and replace potassium.  Avoid QT prolonging agents.  Continue beta-blockers.  Repeat EKG in a.m.  DVT prophylaxis: SCDs in view of GI bleed Code Status: DNR Family / Patient Communication: Family not present at bedside.  Called and discussed with son [name] [phone] answer, d/w other son [name] at [phone]--updated.  [name] stated patient has 24-hour care at home and would prefer to take mother home when medically optimized. Disposition Plan: Likely home once delirium improves.  PT eval once mental status improves     LOS: 5 days    Time spent: 35 minutes    Guilford Shi, MD [facility] Pager [phone]  If 7PM-7AM, please contact night-coverage [email or web address] Password [email or web address] 06/23/2019, 5:57 PM

## 2019-06-23 NOTE — Care Management Important Message (Signed)
Important Message  Patient Details  Name: LILIT HAIRFIELD MRN: [id number] Date of Birth: 1922/07/03   Medicare Important Message Given:  Yes     Shelda Altes 06/23/2019, 3:03 PM

## 2019-06-23 NOTE — Progress Notes (Signed)
Pt had moderate amount, type 6, maroon colored BM around 2:30 this AM.

## 2019-06-24 LAB — CBC
HCT: 31.5 % — ABNORMAL LOW (ref 36.0–46.0)
Hemoglobin: 10.4 g/dL — ABNORMAL LOW (ref 12.0–15.0)
MCH: 30.1 pg (ref 26.0–34.0)
MCHC: 33 g/dL (ref 30.0–36.0)
MCV: 91.3 fL (ref 80.0–100.0)
Platelets: 157 10*3/uL (ref 150–400)
RBC: 3.45 MIL/uL — ABNORMAL LOW (ref 3.87–5.11)
RDW: 14.5 % (ref 11.5–15.5)
WBC: 4.5 10*3/uL (ref 4.0–10.5)
nRBC: 0 % (ref 0.0–0.2)

## 2019-06-24 LAB — BASIC METABOLIC PANEL
Anion gap: 11 (ref 5–15)
BUN: 9 mg/dL (ref 8–23)
CO2: 25 mmol/L (ref 22–32)
Calcium: 8.6 mg/dL — ABNORMAL LOW (ref 8.9–10.3)
Chloride: 106 mmol/L (ref 98–111)
Creatinine, Ser: 0.89 mg/dL (ref 0.44–1.00)
GFR calc Af Amer: >60 mL/min (ref >60)
GFR calc non Af Amer: 55 mL/min — ABNORMAL LOW (ref >60)
Glucose, Bld: 98 mg/dL (ref 70–99)
Potassium: 3.4 mmol/L — ABNORMAL LOW (ref 3.5–5.1)
Sodium: 142 mmol/L (ref 135–145)

## 2019-06-24 LAB — GLUCOSE, CAPILLARY
Glucose-Capillary: 88 mg/dL (ref 70–99)
Glucose-Capillary: 100 mg/dL — ABNORMAL HIGH (ref 70–99)
Glucose-Capillary: 82 mg/dL (ref 70–99)

## 2019-06-24 LAB — MAGNESIUM: Magnesium: 2.1 mg/dL (ref 1.7–2.4)

## 2019-06-24 MED ORDER — POTASSIUM CHLORIDE 10 MEQ/100ML IV SOLN
10.0000 meq | INTRAVENOUS | Status: AC
  Administered 2019-06-24 (×4): 10 meq via INTRAVENOUS
  Filled 2019-06-24 (×4): qty 100

## 2019-06-24 MED ORDER — CLOPIDOGREL BISULFATE 75 MG PO TABS
75.0000 mg | ORAL_TABLET | Freq: Every day | ORAL | Status: DC
  Administered 2019-06-24 – 2019-06-29 (×6): 75 mg via ORAL
  Filled 2019-06-24 (×7): qty 1

## 2019-06-24 NOTE — Progress Notes (Signed)
PROGRESS NOTE  Maria Clarke [id number] DOB: [birth year] DOA: 06/18/2019 PCP: [name], NP  Brief History   85 y.o.femalewithhistory of recent stroke on antiplatelet agents, hypertension, dementia, CAD, hypothyroidism was brought to the ER patient's son found that patient has been throwing up blood associated with some nausea and burping noted by family members for 2 days prior to admission.  On the evening of presentation, when she went to the bathroom she was found to be having hematemesis and almost fell onto the floor but patient's son caught her. Did not lose consciousness. Following this episode, patient had another episode of throwing up blood with reported black stools. EMS was called and patient was found to be hypotensive ED Course: Afebrile.Hemoglobin dropped by 6 g of previous of 14 and is presently around 8. Stool for occult blood was positive. Covid test was negative. TFsed 1 unit of PRBC. CT head is unremarkable except for the expected evolution changes of recent stroke. Hospital course: Patient admitted to [facility] with GI consult. Plavix held. GI bleedingfelt to bedue to gastric ulcer. -underwent EGD (12/11): 15 mm gastric ulcer, no bleeding -Repeat EGD 12/13 no obvious source of bleeding noted-TFsed 2U on 12/11. Repeat Hg10.7>10.9.cont PPI. HC complicated by delirium requiring sitter at bedside.  GI states ok to restart plavix when hemoglobin stable.  Consultants  . Gastroenterology  Procedures  . EGD: Gastric ulcer . Transfusion of 3 units PRBC's  Antibiotics   Anti-infectives (From admission, onward)   None    .  Subjective  The patient is sitting up at bedside. She has been very confused today and at times combative.  Objective   Vitals:  Vitals:   06/24/19 0800 06/24/19 1237  BP: (!) 142/51 (!) 115/54  Pulse: 75 83  Resp: 20 20  Temp: 97.9 F (36.6 C) (!) 97.3 F (36.3 C)  SpO2: 100% 100%   Exam:  Constitutional:  . The patient is  awake, alert, but confused. No acute distress. Respiratory:  . No increased work of breathing. . No wheezes, rales, or rhonchi . No tactile fremitus Cardiovascular:  . Regular rate and rhythm . No murmurs, ectopy, or gallups. . No lateral PMI. No thrills. Abdomen:  . Abdomen is soft, non-tender, non-distended . No hernias, masses, or organomegaly . Normoactive bowel sounds.  Musculoskeletal:  . No cyanosis, clubbing, or edema Skin:  . No rashes, lesions, ulcers . palpation of skin: no induration or nodules Neurologic:  . CN 2-12 intact . Sensation all 4 extremities intact Psychiatric:  . Mental status: Confused.  I have personally reviewed the following:   Today's Data  . Vitals . CBC . BMP  Scheduled Meds: . sodium chloride   Intravenous Once  . sodium chloride   Intravenous Once  . atorvastatin  80 mg Oral q1800  . donepezil  5 mg Oral QHS  . escitalopram  5 mg Oral QHS  . levothyroxine  88 mcg Oral Q0600  . metoprolol tartrate  50 mg Oral BID  . pantoprazole (PROTONIX) IV  40 mg Intravenous Q12H  . potassium chloride  40 mEq Oral Once  . sodium chloride flush  10-40 mL Intracatheter Q12H   Continuous Infusions:  Principal Problem:   Acute GI bleeding Active Problems:   Hypothyroidism   Essential hypertension   Cognitive impairment   CVA (cerebral vascular accident) ([place])   Acute blood loss anemia   Acute gastric ulcer with hemorrhage   Gastric ulcer without hemorrhage or perforation   LOS: 6 days  A & P  Upper GI bleed: Patient had hematemesis/melena on presentation with acute blood loss anemia.  EGD x2 revealed nonbleeding gastric ulcer likely related to aspirin use.  Required 2 units of PRBC in this hospitalization.  Hemoglobin stabilized around 10.  Patient had CVA last month with ASA increasefrom81 mg to 325 mg at that time as well as addition to Plavix. Last Plavix dose was 12/10.  Okay to resume Plavix today per GI but concern for "maroon stool"  reported overnight.  Hemoglobin appears stable again this morning. Will restart Plavix.   Continue Protonix 40 mg twice daily for 8 weeks.  Follow-up H. pylori stool antigen results.  Diet advanced to full liquids but patient not been eating due to confusion/intermittent sedation.Could consider repeat endoscopy in 10-12 weeks vs outpatient monitoring of hemoglobin with repeat endoscopy if any decline per GI and also plan to repeat CBC 7-10 days after discharge to assess for stability.  Dementia/delirium: In the setting of acute blood loss anemia, recent stroke and hospitalization.  EKG shows prolonged QTC at 516 MS.  She received Zyprexa x1 yesterday but would avoid any QT prolonging agents (higher risk with Haldol/Seroquel/risperidone).  Continue sitter and delirium precautions.  Appears to be pleasantly confused this morning.  Avoid benzodiazepines in this elderly patient.  Can add melatonin if available through pharmacy will resume home dose of Lexapro/Aricept.  According to son patient only has memory issues at baseline. PT was unable to work with patient today as she has been combative.  History of CVA: Hold aspirin in view of problem #1.  Can resume Plavix in a.m. if hgb stable. Resumed home statins   History of hypertension: Restarted her home BB. Titrate as needed. May need second agent/amlodpine if uncontrolled.  Treat delirium.  SBP improved to 130s today (was up to 170).  Hypothyroidism: Cont Synthroid  Prolonged QT: Will give one dose magnesium and replace potassium.  Avoid QT prolonging agents.  Continue beta-blockers.  Repeat EKG in a.m.  I have seen and examined this patient myself. I have spent 34 minutes in her evaluation and care.  DVT prophylaxis: SCDs in view of GI bleed Code Status: DNR Family / Patient Communication: Family not present at bedside.  Called and discussed with son [name] [phone] answer, d/w other son [name] at [phone]--updated.  [name] stated patient  has 24-hour care at home and would prefer to take mother home when medically optimized. Disposition Plan: Likely home once delirium improves.  PT eval once mental status improves  Chamia Schmutz, DO [facility] Direct contact: see [email or web address]  7PM-7AM contact night coverage as above 06/24/2019, 6:00 PM  LOS: 6 days

## 2019-06-24 NOTE — Progress Notes (Addendum)
Called son [name] to inform him of patient's room change from 105 to 10. He will call brother [name] to relay information

## 2019-06-24 NOTE — Progress Notes (Signed)
PT Cancellation Note  Patient Details Name: Maria Clarke MRN: [id number] DOB: 1922-01-27   Cancelled Treatment:    Reason Eval/Treat Not Completed: Patient's level of consciousness RN reports pt sleeping and request PT to wait till last to see pt.  Reports pt very confused and combative at times. Upon arrival, pt still soundly asleep.  Sitter reports pt has been walking in room all morning and went to sleep around 1315.  PT will hold as pt had little sleep last night and this morning and is sleeping well now.  Will f/u as able.   Mikael Spray Bunnie Rehberg 06/24/2019, 4:05 PM

## 2019-06-25 LAB — BASIC METABOLIC PANEL
Anion gap: 10 (ref 5–15)
BUN: 11 mg/dL (ref 8–23)
CO2: 25 mmol/L (ref 22–32)
Calcium: 8.9 mg/dL (ref 8.9–10.3)
Chloride: 108 mmol/L (ref 98–111)
Creatinine, Ser: 1.02 mg/dL — ABNORMAL HIGH (ref 0.44–1.00)
GFR calc Af Amer: 54 mL/min — ABNORMAL LOW (ref >60)
GFR calc non Af Amer: 47 mL/min — ABNORMAL LOW (ref >60)
Glucose, Bld: 96 mg/dL (ref 70–99)
Potassium: 3.9 mmol/L (ref 3.5–5.1)
Sodium: 143 mmol/L (ref 135–145)

## 2019-06-25 LAB — CBC WITH DIFFERENTIAL/PLATELET
Abs Immature Granulocytes: 0.02 10*3/uL (ref 0.00–0.07)
Basophils Absolute: 0 10*3/uL (ref 0.0–0.1)
Basophils Relative: 1 %
Eosinophils Absolute: 0.2 10*3/uL (ref 0.0–0.5)
Eosinophils Relative: 5 %
HCT: 31 % — ABNORMAL LOW (ref 36.0–46.0)
Hemoglobin: 10.1 g/dL — ABNORMAL LOW (ref 12.0–15.0)
Immature Granulocytes: 0 %
Lymphocytes Relative: 19 %
Lymphs Abs: 0.9 10*3/uL (ref 0.7–4.0)
MCH: 30.2 pg (ref 26.0–34.0)
MCHC: 32.6 g/dL (ref 30.0–36.0)
MCV: 92.8 fL (ref 80.0–100.0)
Monocytes Absolute: 0.5 10*3/uL (ref 0.1–1.0)
Monocytes Relative: 10 %
Neutro Abs: 3.1 10*3/uL (ref 1.7–7.7)
Neutrophils Relative %: 65 %
Platelets: 146 10*3/uL — ABNORMAL LOW (ref 150–400)
RBC: 3.34 MIL/uL — ABNORMAL LOW (ref 3.87–5.11)
RDW: 14.6 % (ref 11.5–15.5)
WBC: 4.8 10*3/uL (ref 4.0–10.5)
nRBC: 0 % (ref 0.0–0.2)

## 2019-06-25 LAB — GLUCOSE, CAPILLARY
Glucose-Capillary: 64 mg/dL — ABNORMAL LOW (ref 70–99)
Glucose-Capillary: 87 mg/dL (ref 70–99)
Glucose-Capillary: 86 mg/dL (ref 70–99)
Glucose-Capillary: 89 mg/dL (ref 70–99)

## 2019-06-25 LAB — H. PYLORI ANTIGEN, STOOL: H. Pylori Stool Ag, Eia: NEGATIVE

## 2019-06-25 NOTE — TOC Initial Note (Signed)
Transition of Care [facility]) - Initial/Assessment Note    Patient Details  Name: Maria Clarke MRN: [id number] Date of Birth: 1922/06/30  [facility] CM/SW Contact:    [name], RN Phone Number: 06/25/2019, 4:30 PM  Clinical Narrative:  Pt presented for acute blood loss- GI Bleed. Prior to arrival from home alone and son states has 24 hour caregivers. Physical Therapy recommendations for home are Home health physical therapy. CM offered choice and patient chose [facility]. Referral made to [facility] and [facility] of care to begin within 24-48 hours post transition home. CM will continue to follow for additional transition of care needs.                   Expected Discharge Plan: [place] Barriers to Discharge: Continued Medical Work up(MD- wants to have [place] meeting with family)   Patient Goals and CMS Choice Patient states their goals for this hospitalization and ongoing recovery are:: "to return home" CMS Medicare.gov Compare Post Acute Care list provided to:: Patient Represenative (must comment)(Son- [name]) Choice offered to / list presented to : Adult Children  Expected Discharge Plan and Services Expected Discharge Plan: [place]   Discharge Planning Services: CM Consult Post Acute Care Choice: [place] arrangements for the past 2 months: [place]: PT [place]: [place] ([place]) Date [place]: 06/25/19 Time [place]: 1627 Representative spoke with at [place]-  Prior Living Arrangements/Services Living arrangements for the past 2 months: [place] with:: Self, Other (Comment)(Has 24 hour caregivers in the home.) Patient language and need for interpreter reviewed:: Yes Do you feel safe going back to the place where you live?: Yes      Need for Family Participation in Patient  Care: Yes (Comment) Care giver support system in place?: Yes (comment)   Criminal Activity/Legal Involvement Pertinent to Current Situation/Hospitalization: No - Comment as needed  Activities of Daily Living      Permission Sought/Granted Permission sought to share information with : Family Supports(Son in the room and provides support.) Permission granted to share information with : Yes, Verbal Permission Granted     Permission granted to share info w AGENCY: [facility]        Emotional Assessment Appearance:: Appears stated age Attitude/Demeanor/Rapport: Unable to Assess Affect (typically observed): Unable to Assess Orientation: : Oriented to Self Alcohol / Substance Use: Not Applicable Psych Involvement: No (comment)  Admission diagnosis:  Acute GI bleeding [K92.2] Upper GI bleed [K92.2] Fall, initial encounter [W19.XXXA] Symptomatic anemia [D64.9] Hematemesis, presence of nausea not specified [K92.0] Patient Active Problem List   Diagnosis Date Noted  . Gastric ulcer without hemorrhage or perforation   . Acute gastric ulcer with hemorrhage   . Acute GI bleeding 06/18/2019  . Acute blood loss anemia 06/18/2019  . CVA (cerebral vascular accident) ([place]) 05/14/2019  . TIA (transient ischemic attack) 05/12/2019  . Cognitive impairment 10/29/2018  . Shingles outbreak 05/25/2015  . Carotid stenosis 04/20/2014  . Memory loss, short term 08/13/2013  . Occlusion and stenosis of carotid artery without mention of cerebral infarction 04/20/2013  . Carotid bruit 03/05/2013  . Hearing loss of both ears 11/10/2012  . EDEMA- LOCALIZED 12/15/2009  . Hypothyroidism 12/11/2007  .  Depression, recurrent ([place]) 12/11/2007  . Essential hypertension 12/11/2007  . HEMATURIA 02/14/2007   PCP:  [name], NP Pharmacy:   [place], [place] - [address] [place] [address] Phone: [phone] Fax:  [phone]  [id number] - [place], [place] [id number] [place] [id number] Phone: [phone] Fax: [phone]     Social Determinants of Health (SDOH) Interventions    Readmission Risk Interventions No flowsheet data found.

## 2019-06-25 NOTE — Progress Notes (Signed)
PROGRESS NOTE  Maria Clarke [id number] DOB: 01/16/1922 DOA: 06/18/2019 PCP: [name], NP  Brief History   85 y.o.femalewithhistory of recent stroke on antiplatelet agents, hypertension, dementia, CAD, hypothyroidism was brought to the ER patient's son found that patient has been throwing up blood associated with some nausea and burping noted by family members for 2 days prior to admission.  On the evening of presentation, when she went to the bathroom she was found to be having hematemesis and almost fell onto the floor but patient's son caught her. Did not lose consciousness. Following this episode, patient had another episode of throwing up blood with reported black stools. EMS was called and patient was found to be hypotensive ED Course: Afebrile.Hemoglobin dropped by 6 g of previous of 14 and is presently around 8. Stool for occult blood was positive. Covid test was negative. TFsed 1 unit of PRBC. CT head is unremarkable except for the expected evolution changes of recent stroke. Hospital course: Patient admitted to [facility] with GI consult. Plavix held. GI bleedingfelt to bedue to gastric ulcer. -underwent EGD (12/11): 15 mm gastric ulcer, no bleeding -Repeat EGD 12/13 no obvious source of bleeding noted-TFsed 2U on 12/11. Repeat Hg10.7>10.9.cont PPI. HC complicated by delirium requiring sitter at bedside.  GI states ok to restart plavix when hemoglobin stable.  Consultants  . Gastroenterology  Procedures  . EGD: Gastric ulcer . Transfusion of 3 units PRBC's  Antibiotics   Anti-infectives (From admission, onward)   None     Subjective  The patient is lying across her reclining chair in her room. She is confused, but responsive and follows commands.  Objective   Vitals:  Vitals:   06/25/19 1004 06/25/19 1541  BP:  (!) 155/66  Pulse:  81  Resp:  19  Temp:  97.9 F (36.6 C)  SpO2: 97% 100%   Exam:  Constitutional:  . The patient is awake, alert, but  confused. No acute distress. Respiratory:  . No increased work of breathing. . No wheezes, rales, or rhonchi . No tactile fremitus Cardiovascular:  . Regular rate and rhythm . No murmurs, ectopy, or gallups. . No lateral PMI. No thrills. Abdomen:  . Abdomen is soft, non-tender, non-distended . No hernias, masses, or organomegaly . Normoactive bowel sounds.  Musculoskeletal:  . No cyanosis, clubbing, or edema Skin:  . No rashes, lesions, ulcers . palpation of skin: no induration or nodules Neurologic:  . CN 2-12 intact . Sensation all 4 extremities intact Psychiatric:  . Mental status: Confused.  I have personally reviewed the following:   Today's Data  . Vitals . CBC . BMP  Scheduled Meds: . sodium chloride   Intravenous Once  . sodium chloride   Intravenous Once  . atorvastatin  80 mg Oral q1800  . clopidogrel  75 mg Oral Daily  . donepezil  5 mg Oral QHS  . escitalopram  5 mg Oral QHS  . levothyroxine  88 mcg Oral Q0600  . metoprolol tartrate  50 mg Oral BID  . pantoprazole (PROTONIX) IV  40 mg Intravenous Q12H  . potassium chloride  40 mEq Oral Once  . sodium chloride flush  10-40 mL Intracatheter Q12H   Continuous Infusions:  Principal Problem:   Acute GI bleeding Active Problems:   Hypothyroidism   Essential hypertension   Cognitive impairment   CVA (cerebral vascular accident) ([place])   Acute blood loss anemia   Acute gastric ulcer with hemorrhage   Gastric ulcer without hemorrhage or perforation  LOS: 7 days   A & P  Upper GI bleed: Patient had hematemesis/melena on presentation with acute blood loss anemia.  EGD x2 revealed nonbleeding gastric ulcer likely related to aspirin use.  Required 2 units of PRBC in this hospitalization.  Hemoglobin stabilized around 10.  Patient had CVA last month with ASA increasefrom81 mg to 325 mg at that time as well as addition to Plavix. Last Plavix dose was 12/10.  Okay to resume Plavix today per GI but concern  for "maroon stool" reported overnight.  Hemoglobin appears stable again this morning. Will restart Plavix.   Continue Protonix 40 mg twice daily for 8 weeks.  Follow-up H. pylori stool antigen results.  Diet advanced to full liquids but patient not been eating due to confusion/intermittent sedation.Could consider repeat endoscopy in 10-12 weeks vs outpatient monitoring of hemoglobin with repeat endoscopy if any decline per GI and also plan to repeat CBC 7-10 days after discharge to assess for stability.  Dementia/delirium: In the setting of acute blood loss anemia, recent stroke and hospitalization.  EKG shows prolonged QTC at 516 MS.  She received Zyprexa x1 yesterday but would avoid any QT prolonging agents (higher risk with Haldol/Seroquel/risperidone).  Continue sitter and delirium precautions.  Appears to be pleasantly confused this morning.  Avoid benzodiazepines in this elderly patient.  Can add melatonin if available through pharmacy will resume home dose of Lexapro/Aricept.  According to son patient only has memory issues at baseline. PT was unable to work with patient today as she has been combative. The patient's family states that they wish to take her home. She is certainly full [id number] hour supervision at this point. I do not know if they are prepared for this. I have asked palliative care to become involved as this patient may be most appropriate for a memory care situation or hospice at this point.   History of CVA: Hold aspirin in view of problem #1.  Can resume Plavix in a.m. if hgb stable. Resumed home statins   History of hypertension: Restarted her home BB. Titrate as needed. May need second agent/amlodpine if uncontrolled.  Treat delirium.  SBP improved to 130s today (was up to 170).  Hypothyroidism: Cont Synthroid  Prolonged QT: Will give one dose magnesium and replace potassium.  Avoid QT prolonging agents.  Continue beta-blockers.  Repeat EKG in a.m.  I have seen and  examined this patient myself. I have spent 32 minutes in her evaluation and care.  DVT prophylaxis: SCDs in view of GI bleed Code Status: DNR Family / Patient Communication: Family not present at bedside.  Called and discussed with son [name] [phone] answer, d/w other son [name] at [phone]--updated.  [name] stated patient has 24-hour care at home and would prefer to take mother home when medically optimized. Disposition Plan: tbd  Guido Comp, DO [facility] Direct contact: see [email or web address]  7PM-7AM contact night coverage as above 06/24/2019, 6:00 PM  LOS: 6 days

## 2019-06-25 NOTE — Evaluation (Addendum)
Occupational Therapy Evaluation Patient Details Name: Maria Clarke MRN: [id number] DOB: 09-Jul-1922 Today's Date: 06/25/2019    History of Present Illness 85 yo female with PMH of recent L occipital lobe CVA (05/2019), right carotid stenosis (60-79%), hypertension, hyperlipidemia, hypothyroidism, atrial tachycardia, CAD, depression, HCL, and dementia who presented from home for acute GI bleed.   Clinical Impression   Patient is a 85 year old female, PLOF/home set up obtained from PT eval + chart review as patient unable to answer questions this AM/difficulty with redirecting. Patient does have baseline dementia, however per chart son states has increased. Patient sleeping upon arrival, with encouragement and min A sits up at edge of bed. Patient perseverating on seeing the baby "I love babies" A/O x0. Attempt to redirect and encourage patient to participate in self care such as grooming/hygiene at sink or using bathroom. At certain points patient states "okay" when asked to go to the bathroom but will then refuse to ambulate and state "I want to lay down." Patient stood x2 from edge of bed with min hand held assist for safety. Per nursing staff patient was ambulating around the room last night and told the NA when she needed to use the bathroom.     Follow Up Recommendations  No OT follow up;Supervision/Assistance - 24 hour    Equipment Recommendations  Other (comment)(unsure of current home equipment)       Precautions / Restrictions Precautions Precautions: Fall Precaution Comments: dementia at baseline, increased confusion this admission Restrictions Weight Bearing Restrictions: No      Mobility Bed Mobility Overal bed mobility: Needs Assistance Bed Mobility: Supine to Sit;Sit to Supine     Supine to sit: Min assist Sit to supine: Supervision   General bed mobility comments: anticipate min A due to patient not wanting to get out of bed   Transfers Overall transfer level:  Needs assistance Equipment used: 1 person hand held assist Transfers: Sit to/from Stand Sit to Stand: Min assist         General transfer comment: minA HHA for safety     Balance Overall balance assessment: Needs assistance Sitting-balance support: No upper extremity supported;Feet supported Sitting balance-[name] Scale: Good     Standing balance support: Single extremity supported;During functional activity Standing balance-[name] Scale: Fair Standing balance comment: benefits from [place] of 1 for safety                           ADL either performed or assessed with clinical judgement   ADL Overall ADL's : Needs assistance/impaired                                     Functional mobility during ADLs: Minimal assistance(hand held assist) General ADL Comments: unable to direct patient to engage in self care tasks, NA reports patient will state when she has to go to bathroom and was ambulating in the room. anticipate pt supervision level for seated self care tasks for safety due to dementia and currently min A HHA for stability/safety in standing.                   Pertinent Vitals/Pain Pain Assessment: No/denies pain     Hand Dominance Right   Extremity/Trunk Assessment Upper Extremity Assessment Upper Extremity Assessment: Generalized weakness   Lower Extremity Assessment Lower Extremity Assessment: Defer to PT evaluation   Cervical /  Trunk Assessment Cervical / Trunk Assessment: Kyphotic   Communication Communication Communication: No difficulties   Cognition Arousal/Alertness: Awake/alert Behavior During Therapy: Anxious(not wanting to get up, repeatedly stating wants to lay down) Overall Cognitive Status: No family/caregiver present to determine baseline cognitive functioning Area of Impairment: Orientation;Attention;Memory;Following commands;Safety/judgement;Problem solving                 Orientation Level: Disoriented  to;Place;Time;Situation Current Attention Level: Sustained Memory: Decreased short-term memory Following Commands: Follows one step commands inconsistently;Follows one step commands with increased time Safety/Judgement: Decreased awareness of deficits   Problem Solving: Slow processing;Decreased initiation;Requires verbal cues General Comments: difficulty redirecting this AM, try to encourage patient to use bathroom or take a few steps however she persists on laying back down. also perseverating on babies, thinks there is a baby in the room   General Comments  patient perseverating on seeing the baby "I love babies." Will state "okay" when encouraged to go to bathroom and able to stand patient x2 at edge of bed however patient refuses to ambulate and sits back onto edge of bed "I want to lay down."             Home Living Family/patient expects to be discharged to:: Private residence Living Arrangements: Children   Type of Home: House Home Access: Level entry     [place]: One level     Bathroom Shower/Tub: Teacher, early years/pre: Standard         Additional Comments: patient unable to provide answers regarding home set up, per chart son states patient will have 24/7 care at home.      Prior Functioning/Environment Level of Independence: Needs assistance        Comments: patient is unable to provide reliable history, anticipate requiring supervision for ADLs and total A IADLs due to dementia        OT Problem List: Decreased activity tolerance;Impaired balance (sitting and/or standing);Decreased cognition;Decreased safety awareness      OT Treatment/Interventions: Self-care/ADL training;Therapeutic exercise;Therapeutic activities;Cognitive remediation/compensation;Patient/family education;Balance training    OT Goals(Current goals can be found in the care plan section) Acute Rehab OT Goals Patient Stated Goal: "to lay down" OT Goal Formulation: With  patient Time For Goal Achievement: 07/09/19 Potential to Achieve Goals: Good  OT Frequency: Min 2X/week    AM-PAC OT "6 Clicks" Daily Activity     Outcome Measure Help from another person eating meals?: None Help from another person taking care of personal grooming?: A Little Help from another person toileting, which includes using toliet, bedpan, or urinal?: A Little Help from another person bathing (including washing, rinsing, drying)?: A Little Help from another person to put on and taking off regular upper body clothing?: A Little Help from another person to put on and taking off regular lower body clothing?: A Little 6 Click Score: 19   End of Session Nurse Communication: Mobility status  Activity Tolerance: (Treatment limited secondary to cognition) Patient left: in bed;with call bell/phone within reach;with nursing/sitter in room  OT Visit Diagnosis: Muscle weakness (generalized) (M62.81);Other symptoms and signs involving cognitive function;Unsteadiness on feet (R26.81)                Time: [id number] OT Time Calculation (min): 16 min Charges:  OT General Charges $OT Visit: 1 Visit OT Evaluation $OT Eval Moderate Complexity: [place] OT OT office: [place] 06/25/2019, 10:45 AM

## 2019-06-25 NOTE — Evaluation (Signed)
Physical Therapy Evaluation Patient Details Name: Maria Clarke MRN: [id number] DOB: 1921/12/04 Today's Date: 06/25/2019   History of Present Illness  85 yo female with PMH of recent L occipital lobe CVA (05/2019), right carotid stenosis (60-79%), hypertension, hyperlipidemia, hypothyroidism, atrial tachycardia, CAD, depression, HCL, and dementia who presented from home for acute GI bleed.  Clinical Impression  Pt OOB with NT upon PT arrival, agreeable to "go on a walk" with PT this am. The pt demos increased confusion at this time making her an unreliable historian and complicating understanding of her prior level of function. However, the pt was very pleasant and answered questions intermittently through session, while ambulating with HHA of 1 for safety ~200 ft in hallway. The pt had no LOB during ambulation, and no complaints of symptoms associated with GI bleed. The pt will continue to benefit from skilled PT to maximize safety and independence with mobility.     Follow Up Recommendations Home health PT;Supervision/Assistance - 24 hour    Equipment Recommendations  Other (comment)(defer at this time, unsure what pt already has)    Recommendations for Other Services       Precautions / Restrictions Precautions Precautions: Fall Precaution Comments: dementia at baseline, increased confusion this admission Restrictions Weight Bearing Restrictions: No      Mobility  Bed Mobility               General bed mobility comments: pt OOB upon PT arrival  Transfers Overall transfer level: Needs assistance Equipment used: 1 person hand held assist Transfers: Sit to/from Stand Sit to Stand: Min assist         General transfer comment: minA HHA for safety, pt struggles to navigate with RW, good hand placement without cues when offered hand for HHA  Ambulation/Gait Ambulation/Gait assistance: Min assist Gait Distance (Feet): 200 Feet Assistive device: 1 person hand held  assist Gait Pattern/deviations: Step-through pattern;Decreased stride length;WFL(Within Functional Limits)   Gait velocity interpretation: 1.31 - 2.62 ft/sec, indicative of limited community ambulator General Gait Details: Pt gait generally functional, no LOB, benefits from HHA of 1 for safety  Stairs            Wheelchair Mobility    Modified Rankin (Stroke Patients Only)       Balance Overall balance assessment: Needs assistance Sitting-balance support: No upper extremity supported;Feet supported Sitting balance-[name] Scale: Good     Standing balance support: Single extremity supported;During functional activity Standing balance-[name] Scale: Fair Standing balance comment: benefits from [place] of 1 for safety                             Pertinent Vitals/Pain Pain Assessment: No/denies pain    Home Living Family/patient expects to be discharged to:: Private residence                 Additional Comments: pt not able to answer questions regarding home set up, reports she lives with her husband but unable to verify living arrangements or assist    Prior Function Level of Independence: Needs assistance(pt unable to answer reliably at this time)               Hand Dominance   Dominant Hand: Right    Extremity/Trunk Assessment   Upper Extremity Assessment Upper Extremity Assessment: Defer to OT evaluation    Lower Extremity Assessment Lower Extremity Assessment: Overall WFL for tasks assessed;Generalized weakness(pt with weakness, but able to ambulate in  hall ~200 ft with [place] of 1 for safety)    Cervical / Trunk Assessment Cervical / Trunk Assessment: Kyphotic  Communication   Communication: (unsure, pt with flat affect and only intermittant responses to questions.)  Cognition Arousal/Alertness: Awake/alert Behavior During Therapy: Flat affect;WFL for tasks assessed/performed Overall Cognitive Status: No family/caregiver present  to determine baseline cognitive functioning(reports of dementia at baseline) Area of Impairment: Orientation;Attention;Memory;Following commands;Safety/judgement;Problem solving                 Orientation Level: Disoriented to;Place;Time;Situation(unable to verbalize where we are or why she is in hospital) Current Attention Level: Sustained Memory: Decreased short-term memory(unable to recall room ~30 sec after being told room number) Following Commands: Follows one step commands inconsistently;Follows one step commands with increased time Safety/Judgement: Decreased awareness of deficits   Problem Solving: Slow processing;Requires verbal cues General Comments: Pt with slow processing, and inconsistent answering of questions as well as inconsistent command following. Mostly encourageable with appropriate cues, for example, she was able to stand from recliner when asked "will you go on a walk with me?" without any delay      General Comments General comments (skin integrity, edema, etc.): sig confusion during session, but pleasant and agreeable to "go on a walk" with PT to "see where we are" however, upon completion of walk, the pt was unable to state where she was or why we were in hospital. VSS.    Exercises     Assessment/Plan    PT Assessment Patient needs continued PT services  PT Problem List Decreased strength;Decreased mobility;Decreased safety awareness;Decreased knowledge of precautions;Decreased coordination;Decreased activity tolerance;Decreased cognition;Decreased balance;Decreased knowledge of use of DME       PT Treatment Interventions DME instruction;Therapeutic exercise;Gait training;Balance training;Stair training;Functional mobility training;Cognitive remediation;Patient/family education;Therapeutic activities    PT Goals (Current goals can be found in the Care Plan section)  Acute Rehab PT Goals Patient Stated Goal: return home to husband PT Goal Formulation:  With patient Time For Goal Achievement: 07/09/19 Potential to Achieve Goals: Good    Frequency Min 3X/week   Barriers to discharge Other (comment)(unknown caregiver support)      Co-evaluation               AM-PAC PT "6 Clicks" Mobility  Outcome Measure Help needed turning from your back to your side while in a flat bed without using bedrails?: A Little Help needed moving from lying on your back to sitting on the side of a flat bed without using bedrails?: A Little Help needed moving to and from a bed to a chair (including a wheelchair)?: A Little Help needed standing up from a chair using your arms (e.g., wheelchair or bedside chair)?: A Little Help needed to walk in hospital room?: A Little Help needed climbing 3-5 steps with a railing? : A Lot 6 Click Score: 17    End of Session   Activity Tolerance: Patient tolerated treatment well Patient left: in chair;with nursing/sitter in room;with call bell/phone within reach Nurse Communication: Mobility status(confusion) PT Visit Diagnosis: Unsteadiness on feet (R26.81);Difficulty in walking, not elsewhere classified (R26.2);Muscle weakness (generalized) (M62.81)    Time: [id number] PT Time Calculation (min) (ACUTE ONLY): 14 min   Charges:   PT Evaluation $PT Eval Moderate Complexity: 1 Mod          Karma Ganja, PT, DPT   Acute Rehabilitation Department [phone]  Otho Bellows 06/25/2019, 10:18 AM

## 2019-06-26 ENCOUNTER — Other Ambulatory Visit: Payer: Self-pay | Admitting: Internal Medicine

## 2019-06-26 LAB — GLUCOSE, CAPILLARY
Glucose-Capillary: 75 mg/dL (ref 70–99)
Glucose-Capillary: 140 mg/dL — ABNORMAL HIGH (ref 70–99)

## 2019-06-26 NOTE — Progress Notes (Signed)
PROGRESS NOTE  Maria Clarke [id number] DOB: July 04, 1922 DOA: 06/18/2019 PCP: [name], NP  Brief History   85 y.o.femalewithhistory of recent stroke on antiplatelet agents, hypertension, dementia, CAD, hypothyroidism was brought to the ER patient's son found that patient has been throwing up blood associated with some nausea and burping noted by family members for 2 days prior to admission.  On the evening of presentation, when she went to the bathroom she was found to be having hematemesis and almost fell onto the floor but patient's son caught her. Did not lose consciousness. Following this episode, patient had another episode of throwing up blood with reported black stools. EMS was called and patient was found to be hypotensive ED Course: Afebrile.Hemoglobin dropped by 6 g of previous of 14 and is presently around 8. Stool for occult blood was positive. Covid test was negative. TFsed 1 unit of PRBC. CT head is unremarkable except for the expected evolution changes of recent stroke. Hospital course: Patient admitted to [facility] with GI consult. Plavix held. GI bleedingfelt to bedue to gastric ulcer. -underwent EGD (12/11): 15 mm gastric ulcer, no bleeding -Repeat EGD 12/13 no obvious source of bleeding noted-TFsed 2U on 12/11. Repeat Hg10.7>10.9.cont PPI. HC complicated by delirium requiring sitter at bedside.  Plavix has been restarted. PT has evaluated the patient and recommends home health PT with 24 hour supervision.  Consultants  . Gastroenterology . Palliative care  Procedures  . EGD: Gastric ulcer . Transfusion of 3 units PRBC's  Antibiotics   Anti-infectives (From admission, onward)   None     Subjective  The patient is sitting up in her chair. She is confused, but responsive and follows commands.  Objective   Vitals:  Vitals:   06/25/19 2000 06/26/19 1041  BP: (!) 159/48 (!) 151/94  Pulse: 74 71  Resp: 18   Temp: (!) 97.5 F (36.4 C)   SpO2: 100%     Exam:  Constitutional:  . The patient is awake, alert, but confused. No acute distress. Respiratory:  . No increased work of breathing. . No wheezes, rales, or rhonchi . No tactile fremitus Cardiovascular:  . Regular rate and rhythm . No murmurs, ectopy, or gallups. . No lateral PMI. No thrills. Abdomen:  . Abdomen is soft, non-tender, non-distended . No hernias, masses, or organomegaly . Normoactive bowel sounds.  Musculoskeletal:  . No cyanosis, clubbing, or edema Skin:  . No rashes, lesions, ulcers . palpation of skin: no induration or nodules Neurologic:  . CN 2-12 intact . Sensation all 4 extremities intact Psychiatric:  . Mental status: Confused.  I have personally reviewed the following:   Today's Data  . Vitals . CBC . BMP  Scheduled Meds: . sodium chloride   Intravenous Once  . sodium chloride   Intravenous Once  . atorvastatin  80 mg Oral q1800  . clopidogrel  75 mg Oral Daily  . donepezil  5 mg Oral QHS  . escitalopram  5 mg Oral QHS  . levothyroxine  88 mcg Oral Q0600  . metoprolol tartrate  50 mg Oral BID  . pantoprazole (PROTONIX) IV  40 mg Intravenous Q12H  . potassium chloride  40 mEq Oral Once  . sodium chloride flush  10-40 mL Intracatheter Q12H   Continuous Infusions:  Principal Problem:   Acute GI bleeding Active Problems:   Hypothyroidism   Essential hypertension   Cognitive impairment   CVA (cerebral vascular accident) ([place])   Acute blood loss anemia   Acute gastric ulcer  with hemorrhage   Gastric ulcer without hemorrhage or perforation   LOS: 8 days   A & P  Upper GI bleed: Patient had hematemesis/melena on presentation with acute blood loss anemia.  EGD x2 revealed nonbleeding gastric ulcer likely related to aspirin use.  Required 2 units of PRBC in this hospitalization.  Hemoglobin stabilized around 10.  Patient had CVA last month with ASA increasefrom81 mg to 325 mg at that time as well as addition to Plavix. Last Plavix  dose was 12/10.  Okay to resume Plavix today per GI but concern for "maroon stool" reported overnight.  Hemoglobin appears stable again this morning. Will restart Plavix.   Continue Protonix 40 mg twice daily for 8 weeks.  Follow-up H. pylori stool antigen results.  Diet advanced to full liquids but patient not been eating due to confusion/intermittent sedation.Could consider repeat endoscopy in 10-12 weeks vs outpatient monitoring of hemoglobin with repeat endoscopy if any decline per GI and also plan to repeat CBC 7-10 days after discharge to assess for stability.  Dementia/delirium: Somewhat improved today.  In the setting of acute blood loss anemia, recent stroke and hospitalization. Continue sitter and delirium precautions.  Appears to be a little less confused this morning.  Avoid benzodiazepines in this elderly patient.  Can add melatonin if available through pharmacy will resume home dose of Lexapro/Aricept.  According to son patient only has memory issues at baseline. PT worked with the patient today and has recommended home with HHPT and 24 hour supervision. The patient's family states that they wish to take her home.I have asked palliative care to become involved as this patient may be most appropriate for a memory care situation or hospice at this point.   History of CVA: Hold aspirin in view of problem #1.  Can resume Plavix in a.m. if hgb stable. Resumed home statins   History of hypertension: Restarted her home BB. Titrate as needed. May need second agent/amlodpine if uncontrolled.  Treat delirium.  SBP improved to 130s today (was up to 170).  Hypothyroidism: Cont Synthroid  Prolonged QT: Will give one dose magnesium and replace potassium.  Avoid QT prolonging agents.  Continue beta-blockers.  Repeat EKG in a.m.  I have seen and examined this patient myself. I have spent 30 minutes in her evaluation and care.  DVT prophylaxis: SCDs in view of GI bleed Code Status: DNR Family /  Patient Communication: Family not present at bedside.  Called and discussed with son Maria Clarke [phone] answer, d/w other son Maria Clarke at [phone]--updated.  Maria Clarke stated patient has 24-hour care at home and would prefer to take mother home when medically optimized. Disposition Plan: Home with home health PT and 24 hour supervision  Maria Uffelman, DO [facility] Direct contact: see [email or web address]  7PM-7AM contact night coverage as above 06/26/2019, 6:17 PM  LOS: 6 days

## 2019-06-26 NOTE — Care Management Important Message (Signed)
Important Message  Patient Details  Name: Maria Clarke MRN: [id number] Date of Birth: [birth year]   Medicare Important Message Given:  Yes     Shelda Altes 06/26/2019, 12:27 PM

## 2019-06-26 NOTE — Progress Notes (Signed)
Physical Therapy Treatment Patient Details Name: Maria Clarke MRN: [id number] DOB: Feb 07, 1922 Today's Date: 06/26/2019    History of Present Illness 85 yo female with PMH of recent L occipital lobe CVA (05/2019), right carotid stenosis (60-79%), hypertension, hyperlipidemia, hypothyroidism, atrial tachycardia, CAD, depression, HCL, and dementia who presented from home for acute GI bleed.    PT Comments    Pt making steady progress with mobility. Has good home support. Continue to recommend home with HHPT.   Follow Up Recommendations  Home health PT;Supervision/Assistance - 24 hour     Equipment Recommendations  None recommended by PT    Recommendations for Other Services       Precautions / Restrictions Precautions Precautions: Fall Precaution Comments: dementia at baseline, increased confusion this admission Restrictions Weight Bearing Restrictions: No    Mobility  Bed Mobility               General bed mobility comments: Pt up in chair  Transfers Overall transfer level: Needs assistance Equipment used: 1 person hand held assist Transfers: Sit to/from Stand Sit to Stand: Min assist         General transfer comment: Asist for balance  Ambulation/Gait Ambulation/Gait assistance: Min assist Gait Distance (Feet): 225 Feet Assistive device: 1 person hand held assist Gait Pattern/deviations: Step-through pattern;Decreased stride length Gait velocity: decr Gait velocity interpretation: 1.31 - 2.62 ft/sec, indicative of limited community ambulator General Gait Details: Assist for balance.    Stairs             Wheelchair Mobility    Modified Rankin (Stroke Patients Only)       Balance Overall balance assessment: Needs assistance Sitting-balance support: No upper extremity supported;Feet supported Sitting balance-[name] Scale: Good     Standing balance support: Single extremity supported;During functional activity Standing balance-[name]  Scale: Fair Standing balance comment: UE support                            Cognition Arousal/Alertness: Awake/alert Behavior During Therapy: WFL for tasks assessed/performed Overall Cognitive Status: History of cognitive impairments - at baseline                                        Exercises      General Comments        Pertinent Vitals/Pain Pain Assessment: No/denies pain    Home Living                 Additional Comments: Pt has hired 24 hour caregivers    Prior Function            PT Goals (current goals can now be found in the care plan section) Progress towards PT goals: Progressing toward goals    Frequency    Min 3X/week      PT Plan Current plan remains appropriate    Co-evaluation              AM-PAC PT "6 Clicks" Mobility   Outcome Measure  Help needed turning from your back to your side while in a flat bed without using bedrails?: A Little Help needed moving from lying on your back to sitting on the side of a flat bed without using bedrails?: A Little Help needed moving to and from a bed to a chair (including a wheelchair)?: A Little Help needed standing up from  a chair using your arms (e.g., wheelchair or bedside chair)?: A Little Help needed to walk in hospital room?: A Little Help needed climbing 3-5 steps with a railing? : A Little 6 Click Score: 18    End of Session Equipment Utilized During Treatment: Gait belt Activity Tolerance: Patient tolerated treatment well Patient left: in chair;with call bell/phone within reach;with chair alarm set;with family/visitor present   PT Visit Diagnosis: Unsteadiness on feet (R26.81);Difficulty in walking, not elsewhere classified (R26.2);Muscle weakness (generalized) (M62.81)     Time: [id number] PT Time Calculation (min) (ACUTE ONLY): 13 min  Charges:  $Gait Training: 8-22 mins                     [place] Pager  [phone] Office [place] 06/26/2019, 4:29 PM

## 2019-06-26 NOTE — Progress Notes (Signed)
PALLIATIVE NOTE:  Referral received for goals of care discussion. Chart reviewed and I met with patient at bedside and assessed. Maria Clarke awake and fidgeting with kerlix over IV site. Easily redirected. Able to appropriately provide name only. Could not notate birthday, location etc. States she did not have any children and could not provide appropriate responses when inquired about sons even with identifying them by name to her.   I have called and left a message with [name] and [name] for goals of care discussion. Will await on return phone call and time.   Detailed notes and recommendations to follow.   Thank you for your referral and allowing Palliative to be involved in Maria Clarke's care.   Alda Lea, AGPCNP-BC Palliative Medicine Team   NO CHARGE

## 2019-06-27 ENCOUNTER — Other Ambulatory Visit: Payer: Self-pay

## 2019-06-27 DIAGNOSIS — Z7189 Other specified counseling: Secondary | ICD-10-CM

## 2019-06-27 DIAGNOSIS — W19XXXA Unspecified fall, initial encounter: Secondary | ICD-10-CM

## 2019-06-27 DIAGNOSIS — Z66 Do not resuscitate: Secondary | ICD-10-CM

## 2019-06-27 DIAGNOSIS — Z515 Encounter for palliative care: Secondary | ICD-10-CM

## 2019-06-27 LAB — CBC WITH DIFFERENTIAL/PLATELET
Abs Immature Granulocytes: 0.02 10*3/uL (ref 0.00–0.07)
Basophils Absolute: 0 10*3/uL (ref 0.0–0.1)
Basophils Relative: 1 %
Eosinophils Absolute: 0.2 10*3/uL (ref 0.0–0.5)
Eosinophils Relative: 6 %
HCT: 29.4 % — ABNORMAL LOW (ref 36.0–46.0)
Hemoglobin: 9.5 g/dL — ABNORMAL LOW (ref 12.0–15.0)
Immature Granulocytes: 1 %
Lymphocytes Relative: 34 %
Lymphs Abs: 1.3 10*3/uL (ref 0.7–4.0)
MCH: 30.1 pg (ref 26.0–34.0)
MCHC: 32.3 g/dL (ref 30.0–36.0)
MCV: 93 fL (ref 80.0–100.0)
Monocytes Absolute: 0.5 10*3/uL (ref 0.1–1.0)
Monocytes Relative: 14 %
Neutro Abs: 1.7 10*3/uL (ref 1.7–7.7)
Neutrophils Relative %: 44 %
Platelets: 145 10*3/uL — ABNORMAL LOW (ref 150–400)
RBC: 3.16 MIL/uL — ABNORMAL LOW (ref 3.87–5.11)
RDW: 14.5 % (ref 11.5–15.5)
WBC: 3.8 10*3/uL — ABNORMAL LOW (ref 4.0–10.5)
nRBC: 0 % (ref 0.0–0.2)

## 2019-06-27 LAB — BASIC METABOLIC PANEL
Anion gap: 8 (ref 5–15)
BUN: 12 mg/dL (ref 8–23)
CO2: 27 mmol/L (ref 22–32)
Calcium: 8.8 mg/dL — ABNORMAL LOW (ref 8.9–10.3)
Chloride: 110 mmol/L (ref 98–111)
Creatinine, Ser: 1.02 mg/dL — ABNORMAL HIGH (ref 0.44–1.00)
GFR calc Af Amer: 54 mL/min — ABNORMAL LOW (ref >60)
GFR calc non Af Amer: 47 mL/min — ABNORMAL LOW (ref >60)
Glucose, Bld: 97 mg/dL (ref 70–99)
Potassium: 3.8 mmol/L (ref 3.5–5.1)
Sodium: 145 mmol/L (ref 135–145)

## 2019-06-27 MED ORDER — PANTOPRAZOLE SODIUM 40 MG PO TBEC
40.0000 mg | DELAYED_RELEASE_TABLET | Freq: Two times a day (BID) | ORAL | Status: DC
  Administered 2019-06-27 – 2019-07-03 (×12): 40 mg via ORAL
  Filled 2019-06-27 (×11): qty 1

## 2019-06-27 MED ORDER — HALOPERIDOL LACTATE 5 MG/ML IJ SOLN
5.0000 mg | Freq: Once | INTRAMUSCULAR | Status: AC
  Administered 2019-06-28: 5 mg via INTRAMUSCULAR
  Filled 2019-06-27: qty 1

## 2019-06-27 NOTE — Progress Notes (Signed)
Patient lost IV access this morning. Per Dr. Benny Lennert, [place] for patient to remain without IV access.

## 2019-06-27 NOTE — Progress Notes (Signed)
PROGRESS NOTE  Maria Clarke [id number] DOB: [birth year] DOA: 06/18/2019 PCP: [name], NP  Brief History   85 y.o.femalewithhistory of recent stroke on antiplatelet agents, hypertension, dementia, CAD, hypothyroidism was brought to the ER patient's son found that patient has been throwing up blood associated with some nausea and burping noted by family members for 2 days prior to admission.  On the evening of presentation, when she went to the bathroom she was found to be having hematemesis and almost fell onto the floor but patient's son caught her. Did not lose consciousness. Following this episode, patient had another episode of throwing up blood with reported black stools. EMS was called and patient was found to be hypotensive ED Course: Afebrile.Hemoglobin dropped by 6 g of previous of 14 and is presently around 8. Stool for occult blood was positive. Covid test was negative. TFsed 1 unit of PRBC. CT head is unremarkable except for the expected evolution changes of recent stroke. Hospital course: [facility] with GI consult. Plavix held. GI bleedingfelt to bedue to gastric ulcer. -underwent EGD (12/11): 15 mm gastric ulcer, no bleeding -Repeat EGD 12/13 no obvious source of bleeding noted-TFsed 2U on 12/11. Repeat Hg10.7>10.9.cont PPI. HC complicated by delirium requiring sitter at bedside.  Plavix has been restarted. PT has evaluated the patient and recommends home health PT with 24 hour supervision.  The patient's husband is at bedside. He states that they will not be able to safely take her home until her 24 hour nursing care is available on Monday. He will be able to safely take her home at that point.  Consultants  . Gastroenterology . Palliative care  Procedures  . EGD: Gastric ulcer . Transfusion of 3 units PRBC's  Antibiotics   Anti-infectives (From admission, onward)   None     Subjective  The patient is sitting up in her chair. She is  confused, but responsive and follows commands.  Objective   Vitals:  Vitals:   06/27/19 1105 06/27/19 1350  BP:  (!) 156/61  Pulse: 63 (!) 57  Resp:  18  Temp:  97.8 F (36.6 C)  SpO2:  100%   Exam:  Constitutional:  . The patient is awake, alert, but confused. No acute distress. Respiratory:  . No increased work of breathing. . No wheezes, rales, or rhonchi . No tactile fremitus Cardiovascular:  . Regular rate and rhythm . No murmurs, ectopy, or gallups. . No lateral PMI. No thrills. Abdomen:  . Abdomen is soft, non-tender, non-distended . No hernias, masses, or organomegaly . Normoactive bowel sounds.  Musculoskeletal:  . No cyanosis, clubbing, or edema Skin:  . No rashes, lesions, ulcers . palpation of skin: no induration or nodules Neurologic:  . CN 2-12 intact . Sensation all 4 extremities intact Psychiatric:  . Mental status: Confused.  I have personally reviewed the following:   Today's Data  . Vitals . CBC . BMP  Scheduled Meds: . sodium chloride   Intravenous Once  . sodium chloride   Intravenous Once  . atorvastatin  80 mg Oral q1800  . clopidogrel  75 mg Oral Daily  . donepezil  5 mg Oral QHS  . escitalopram  5 mg Oral QHS  . levothyroxine  88 mcg Oral Q0600  . metoprolol tartrate  50 mg Oral BID  . pantoprazole  40 mg Oral BID  . potassium chloride  40 mEq Oral Once  . sodium chloride flush  10-40 mL Intracatheter Q12H   Continuous Infusions:  Principal Problem:   Acute GI bleeding Active Problems:   Hypothyroidism   Essential hypertension   Cognitive impairment   CVA (cerebral vascular accident) ([place])   Acute blood loss anemia   Acute gastric ulcer with hemorrhage   Gastric ulcer without hemorrhage or perforation   LOS: 9 days   A & P  Upper GI bleed: Patient had hematemesis/melena on presentation with acute blood loss anemia.  EGD x2 revealed nonbleeding gastric ulcer likely related to aspirin use.  Required 2 units of PRBC in  this hospitalization.  Hemoglobin stabilized around 10.  Patient had CVA last month with ASA increasefrom81 mg to 325 mg at that time as well as addition to Plavix. Last Plavix dose was 12/10.  Okay to resume Plavix today per GI but concern for "maroon stool" reported overnight.  Hemoglobin appears stable again this morning. Will restart Plavix.   Continue Protonix 40 mg twice daily for 8 weeks.  Follow-up H. pylori stool antigen results.  Diet advanced to full liquids but patient not been eating due to confusion/intermittent sedation.Could consider repeat endoscopy in 10-12 weeks vs outpatient monitoring of hemoglobin with repeat endoscopy if any decline per GI and also plan to repeat CBC 7-10 days after discharge to assess for stability.  Dementia/delirium: Somewhat improved today.  In the setting of acute blood loss anemia, recent stroke and hospitalization. Continue sitter and delirium precautions.  Appears to be a little less confused this morning.  Avoid benzodiazepines in this elderly patient.  Can add melatonin if available through pharmacy will resume home dose of Lexapro/Aricept.  According to son patient only has memory issues at baseline. PT worked with the patient today and has recommended home with HHPT and 24 hour supervision. The patient's family states that they wish to take her home, however they will not be able to do so safely until her home nursing support will be available on Monday.  History of CVA: Hold aspirin in view of problem #1.  Can resume Plavix in a.m. if hgb stable. Resumed home statins   History of hypertension: Restarted her home BB. Titrate as needed. May need second agent/amlodpine if uncontrolled.  Treat delirium.  SBP improved to 130s today (was up to 170).  Hypothyroidism: Cont Synthroid  Prolonged QT: Will give one dose magnesium and replace potassium.  Avoid QT prolonging agents.  Continue beta-blockers.  Repeat EKG in a.m.  I have seen and examined this  patient myself. I have spent 30 minutes in her evaluation and care.  DVT prophylaxis: SCDs in view of GI bleed Code Status: DNR Family / Patient Communication: Family not present at bedside.  Called and discussed with son [name] [phone] answer, d/w other son [name] at [phone]--updated.  [name] stated patient has 24-hour care at home and would prefer to take mother home when medically optimized. Disposition Plan: Home with home health PT and 24 hour supervision/nursing support. This will not be available until Monday. No dafe discharge is available at this point.  Calel Pisarski, DO [facility] Direct contact: see [email or web address]  7PM-7AM contact night coverage as above 06/27/2019, 4:26 PM  LOS: 6 days

## 2019-06-27 NOTE — Consult Note (Signed)
Consultation Note Date: 06/27/2019   Patient Name: Maria Clarke  DOB: 1922/01/17  MRN: [id number]  Age / Sex: 85 y.o., female   PCP: [name], NP Referring Physician: Karie Kirks, DO   REASON FOR CONSULTATION:Establishing goals of care  Palliative Care consult requested for goals of care in this 85 y.o. female with multiple medical problems including recent stroke (ASA/Plavix), hypertension, dementia, CAD, hypothyroidism, depression, and hypercholesteremia. She presented to ED with complaints of hematemesis as reported by her son. Son also reported black stools. During her ED work-up patient was hypotensive and received a fluid bolus. Hgb 6, stool occult positive, COVID-19 negative. She was started on Protonix infusion and initially received 1 unit PRBC. She is followed by GI and s/p EGD x2 most recent 12/13 with similar findings from 12/11 54mm non-bleeding gastric ulcer with recommendations to continue Protonix.        Clinical Assessment and Goals of Care: I have reviewed medical records including lab results, imaging, Epic notes, and MAR, received report from the bedside RN, and assessed the patient. I spoke to patient's sons [name] and Jamyria Nesbitt) via phone conference  to discuss diagnosis prognosis, [place], EOL wishes, disposition and options. Ms. Barbe is awake, alert, and pleasantly confused. She follows commands appropriately. Alert to self only.   I introduced Palliative Medicine as specialized medical care for people living with serious illness. It focuses on providing relief from the symptoms and stress of a serious illness. The goal is to improve quality of life for both the patient and the family.  We discussed a brief life review of the patient, along with her functional and nutritional status. Sons report patient is a native of [place], [place]. She was married for more than 61 years and her husband passed away several years ago. 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 She has no surviving family or friends other than her 2 children.   Prior to admission [name] reports patient lived in the home with 24/7 caregivers. He reports her dementia has progressed some over the past year. She has "good days and bad days". She recognizes her children intermittently. He reports she does not use any assistive devices in the home but does require standby assistance and assistance with all ADLs. Patient's appetite has become more of a challenge over the past months due to a decrease and unwillingness to take medications at times. Son reports having to sit down, take time, and convince patient to eat or take medications.   We discussed Her current illness and what it means in the larger context of Her on-going co-morbidities. With specific discussions regarding GI bleed, dementia, CVA, and her overall functional state. Natural disease trajectory and expectations at EOL were discussed.   Sons verbalized awareness and understanding of patient's current illness and co-morbidities. They remain hopeful patient will return home with 24/7 caregivers. They understand the level of care needed and feels her known caregivers is familiar with her and can continue to provide the best care for her in her home.   I discussed with sons dementia and it's trajectory. We discussed concerns with decreased appetite, difficulty with willingness to take medications, risk for dysphagia and aspiration. [name] verbalized understanding.   I attempted to elicit values and goals of care important to the patient.    Family expressed goals for patient is to continue to received the best care  possible with hopes of returning home soon to family and her familiar caregivers.   We discussed advanced directives, concepts specific to code status, artifical feeding and hydration, and rehospitalization. Patient does have a  documented advanced directive which indicates Vanita Panda and Cherie Dark as HCPOA. Family confirms DNR/DNI. Sons state they are unsure at this time if patient was to require nutrition support if they would be interested in artificial feeding. [name] shares experience with his father during his last years of life and going through artificial feedings with him. He expressed that was not a good experience and a very difficult one. He reports they would have to make a decision based on what her circumstances were at that time, and could not give a definitive answer until faced with needing to make the decision.   Hospice and Palliative Care services outpatient were explained and offered. Family verbalized their understanding and awareness of  palliative and goals and philosophy of care. They are interested in their services outpatient.   Questions and concerns were addressed.  Hard Choices booklet left for review. The family was encouraged to call with questions or concerns.  PMT will continue to support holistically.   SOCIAL HISTORY:     reports that she has never smoked. She has never used smokeless tobacco. She reports that she does not drink alcohol or use drugs.  CODE STATUS: DNR  ADVANCE DIRECTIVES: [name] & Vanita Panda (Sons)   SYMPTOM MANAGEMENT: per attending   Palliative Prophylaxis:   Aspiration, Bowel Regimen, Delirium Protocol, Frequent Pain Assessment and Oral Care  PSYCHO-SOCIAL/SPIRITUAL:  Support System: Family   Desire for further Chaplaincy support: NO   Additional Recommendations (Limitations, Scope, Preferences):  Full Scope Treatment   PAST MEDICAL HISTORY: Past Medical History:  Diagnosis Date  . Atrial tachycardia ([place])    a. Noted 02/2013 on EKG.  . Carotid artery disease ([place])    Carotid duplex showed [id number] RICA and [id number] LICA  . Depression   . Hypercholesteremia   . Hypertension   . Hypothyroidism   . Stroke [facility])     PAST SURGICAL HISTORY:    Past Surgical History:  Procedure Laterality Date  . BREAST SURGERY Left    Benign Tumor- several years ago  . CESAREAN SECTION    . ESOPHAGOGASTRODUODENOSCOPY (EGD) WITH PROPOFOL N/A 06/19/2019   Procedure: ESOPHAGOGASTRODUODENOSCOPY (EGD) WITH PROPOFOL;  Surgeon: Jerene Bears, MD;  Location: [place];  Service: Gastroenterology;  Laterality: N/A;  . ESOPHAGOGASTRODUODENOSCOPY (EGD) WITH PROPOFOL N/A 06/21/2019   Procedure: ESOPHAGOGASTRODUODENOSCOPY (EGD) WITH PROPOFOL;  Surgeon: Thornton Park, MD;  Location: [facility];  Service: Gastroenterology;  Laterality: N/A;  . EYE SURGERY Bilateral    cataract    ALLERGIES:  is allergic to sulfonamide derivatives and mirtazapine.   MEDICATIONS:  Current Facility-Administered Medications  Medication Dose Route Frequency Provider Last Rate Last Admin  . 0.9 %  sodium chloride infusion (Manually program via Guardrails IV Fluids)   Intravenous Once Rise Patience, MD      . 0.9 %  sodium chloride infusion (Manually program via Guardrails IV Fluids)   Intravenous Once Kinnie Feil, MD      . acetaminophen (TYLENOL) tablet 650 mg  650 mg Oral Q6H PRN Rise Patience, MD   650 mg at 06/26/19 1430   Or  . acetaminophen (TYLENOL) suppository 650 mg  650 mg Rectal Q6H PRN Rise Patience, MD      . atorvastatin (LIPITOR) tablet 80 mg  80 mg  Oral q1800 Guilford Shi, MD   80 mg at 06/24/19 1830  . clopidogrel (PLAVIX) tablet 75 mg  75 mg Oral Daily Swayze, Ava, DO   75 mg at 06/26/19 1041  . donepezil (ARICEPT) tablet 5 mg  5 mg Oral QHS Gu[place] Shi, MD   5 mg at 06/26/19 2036  . escitalopram (LEXAPRO) tablet 5 mg  5 mg Oral QHS Guilford Shi, MD   5 mg at 06/26/19 2036  . levothyroxine (SYNTHROID) tablet 88 mcg  88 mcg Oral Q0600 Kinnie Feil, MD   88 mcg at 06/27/19 0711  . metoprolol tartrate (LOPRESSOR) tablet 50 mg  50 mg Oral BID Kinnie Feil, MD   50 mg at 06/26/19 2035  . morphine 2 MG/ML  injection 1-2 mg  1-2 mg Intravenous Q4H PRN Omar Person, NP   2 mg at 06/25/19 2259  . ondansetron (ZOFRAN) tablet 4 mg  4 mg Oral Q6H PRN Rise Patience, MD       Or  . ondansetron [facility]) injection 4 mg  4 mg Intravenous Q6H PRN Rise Patience, MD      . pantoprazole (PROTONIX) injection 40 mg  40 mg Intravenous Q12H Loralie Champagne, PA-C   40 mg at 06/26/19 2036  . potassium chloride (KLOR-CON) packet 40 mEq  40 mEq Oral Once Gu[place] Shi, MD      . sodium chloride flush (NS) 0.9 % injection 10-40 mL  10-40 mL Intracatheter Q12H Spongberg, Audie Pinto, MD   10 mL at 06/26/19 2045  . sodium chloride flush (NS) 0.9 % injection 10-40 mL  10-40 mL Intracatheter PRN Marcell Anger, MD   20 mL at 06/21/19 2037    VITAL SIGNS: BP (!) 129/50 (BP Location: Right Arm)   Pulse 65   Temp 97.7 F (36.5 C) (Oral)   Resp 18   Ht 5\' 3"  (1.6 m)   Wt 55.7 kg   SpO2 99%   BMI 21.77 kg/m  Filed Weights   06/21/19 1133 06/23/19 0647 06/24/19 0615  Weight: 57.9 kg 57.2 kg 55.7 kg    Estimated body mass index is 21.77 kg/m as calculated from the following:   Height as of this encounter: 5\' 3"  (1.6 m).   Weight as of this encounter: 55.7 kg.  LABS: CBC:    Component Value Date/Time   WBC 3.8 (L) 06/27/2019 0428   HGB 9.5 (L) 06/27/2019 0428   HCT 29.4 (L) 06/27/2019 0428   PLT 145 (L) 06/27/2019 0428   Comprehensive Metabolic Panel:    Component Value Date/Time   NA 145 06/27/2019 0428   K 3.8 06/27/2019 0428   CO2 27 06/27/2019 0428   BUN 12 06/27/2019 0428   CREATININE 1.02 (H) 06/27/2019 0428   ALBUMIN 2.7 (L) 06/18/2019 2056     Review of Systems  Unable to perform ROS: Dementia    Physical Exam General: NAD Cardiovascular: regular rate and rhythm Pulmonary: clear ant fields Abdomen: soft, nontender, + bowel sounds Extremities: no edema, no joint deformities Skin: no rashes Neurological: awake, alert, confused, will follow  commands   Prognosis: Guarded  Discharge Planning:  Home with Home Health (hired 24/7 caregivers) and outpatient Palliative.   Recommendations:  DNR/DNI-as confirmed by sons  Continue with current plan of care per medical team  Sons remain hopeful patient may return home with 24/7 care assistance and outpatient palliative. (Referral placed)  Family aware of trajectory of dementia. Full scope of care and will  make decisions based on each individual circumstance as they arise.   PMT will continue to support and follow as needed.    Palliative Performance Scale: PPS 20-30%                Family expressed understanding and was in agreement with this plan.   Thank you for allowing the Palliative Medicine Team to assist in the care of this patient.  Time In: 0945 Time Out: 1040 Time Total: 55 min.   Visit consisted of counseling and education dealing with the complex and emotionally intense issues of symptom management and palliative care in the setting of serious and potentially life-threatening illness.Greater than 50%  of this time was spent counseling and coordinating care related to the above assessment and plan.  Signed by:  Alda Lea, AGPCNP-BC Palliative Medicine Team  Phone: [phone] Fax: [phone] Pager: [phone] Amion: Bjorn Pippin

## 2019-06-28 ENCOUNTER — Encounter (HOSPITAL_COMMUNITY): Payer: Self-pay | Admitting: Internal Medicine

## 2019-06-28 LAB — HEMOGLOBIN AND HEMATOCRIT, BLOOD
HCT: 32 % — ABNORMAL LOW (ref 36.0–46.0)
Hemoglobin: 10.2 g/dL — ABNORMAL LOW (ref 12.0–15.0)

## 2019-06-28 NOTE — Progress Notes (Signed)
PROGRESS NOTE  Maria Clarke [id number] DOB: 05/09/1922 DOA: 06/18/2019 PCP: [name], NP  Brief History   85 y.o.femalewithhistory of recent stroke on antiplatelet agents, hypertension, dementia, CAD, hypothyroidism was brought to the ER patient's son found that patient has been throwing up blood associated with some nausea and burping noted by family members for 2 days prior to admission.  On the evening of presentation, when she went to the bathroom she was found to be having hematemesis and almost fell onto the floor but patient's son caught her. Did not lose consciousness. Following this episode, patient had another episode of throwing up blood with reported black stools. EMS was called and patient was found to be hypotensive ED Course: Afebrile.Hemoglobin dropped by 6 g of previous of 14 and is presently around 8. Stool for occult blood was positive. Covid test was negative. TFsed 1 unit of PRBC. CT head is unremarkable except for the expected evolution changes of recent stroke. Hospital course: Patient admitted to [facility] with GI consult. Plavix held. GI bleedingfelt to bedue to gastric ulcer. -underwent EGD (12/11): 15 mm gastric ulcer, no bleeding -Repeat EGD 12/13 no obvious source of bleeding noted-TFsed 2U on 12/11. Repeat Hg10.7>10.9.cont PPI. HC complicated by delirium requiring sitter at bedside.  Plavix has been restarted. PT has evaluated the patient and recommends home health PT with 24 hour supervision.  The patient's husband is at bedside. He states that they will not be able to safely take her home until her 24 hour nursing care is available on Monday. He will be able to safely take her home at that point.  Consultants  . Gastroenterology . Palliative care  Procedures  . EGD: Gastric ulcer . Transfusion of 3 units PRBC's  Antibiotics   Anti-infectives (From admission, onward)   None     Subjective  The patient is sitting up in her chair. She is  confused, but responsive and follows commands.  Objective   Vitals:  Vitals:   06/27/19 2207 06/28/19 1314  BP: (!) 152/60 (!) 130/58  Pulse: 64 73  Resp: 19 18  Temp: 97.9 F (36.6 C) 98 F (36.7 C)  SpO2: 100% 98%   Exam:  Constitutional:  . The patient is awake, alert, but confused. No acute distress. Respiratory:  . No increased work of breathing. . No wheezes, rales, or rhonchi . No tactile fremitus Cardiovascular:  . Regular rate and rhythm . No murmurs, ectopy, or gallups. . No lateral PMI. No thrills. Abdomen:  . Abdomen is soft, non-tender, non-distended . No hernias, masses, or organomegaly . Normoactive bowel sounds.  Musculoskeletal:  . No cyanosis, clubbing, or edema Skin:  . No rashes, lesions, ulcers . palpation of skin: no induration or nodules Neurologic:  . CN 2-12 intact . Sensation all 4 extremities intact Psychiatric:  . Mental status: Confused.  I have personally reviewed the following:   Today's Data  . Vitals . CBC . BMP  Scheduled Meds: . sodium chloride   Intravenous Once  . sodium chloride   Intravenous Once  . atorvastatin  80 mg Oral q1800  . clopidogrel  75 mg Oral Daily  . donepezil  5 mg Oral QHS  . escitalopram  5 mg Oral QHS  . levothyroxine  88 mcg Oral Q0600  . metoprolol tartrate  50 mg Oral BID  . pantoprazole  40 mg Oral BID  . potassium chloride  40 mEq Oral Once   Continuous Infusions:  Principal Problem:   Acute GI  bleeding Active Problems:   Hypothyroidism   Essential hypertension   Cognitive impairment   CVA (cerebral vascular accident) ([place])   Acute blood loss anemia   Acute gastric ulcer with hemorrhage   Gastric ulcer without hemorrhage or perforation   LOS: 10 days   A & P  Upper GI bleed: Patient had hematemesis/melena on presentation with acute blood loss anemia.  EGD x2 revealed nonbleeding gastric ulcer likely related to aspirin use.  Required 2 units of PRBC in this hospitalization.   Hemoglobin stabilized around 10.  Patient had CVA last month with ASA increasefrom81 mg to 325 mg at that time as well as addition to Plavix. Last Plavix dose was 12/10.  Okay to resume Plavix today per GI but concern for "maroon stool" reported overnight.  Hemoglobin appears stable again this morning. Will restart Plavix.   Continue Protonix 40 mg twice daily for 8 weeks.  Follow-up H. pylori stool antigen results.  Diet advanced to full liquids but patient not been eating due to confusion/intermittent sedation.Could consider repeat endoscopy in 10-12 weeks vs outpatient monitoring of hemoglobin with repeat endoscopy if any decline per GI and also plan to repeat CBC 7-10 days after discharge to assess for stability.  Dementia/delirium: Somewhat improved today.  In the setting of acute blood loss anemia, recent stroke and hospitalization. Continue sitter and delirium precautions.  Appears to be a little less confused this morning.  Avoid benzodiazepines in this elderly patient.  Can add melatonin if available through pharmacy will resume home dose of Lexapro/Aricept.  According to son patient only has memory issues at baseline. PT worked with the patient today and has recommended home with HHPT and 24 hour supervision. The patient's family states that they wish to take her home, however they will not be able to do so safely until her home nursing support will be available on Monday.  History of CVA: Hold aspirin in view of problem #1.  Can resume Plavix in a.m. if hgb stable. Resumed home statins   History of hypertension: Restarted her home BB. Titrate as needed. May need second agent/amlodpine if uncontrolled.  Treat delirium.  SBP improved to 130s today (was up to 170).  Hypothyroidism: Cont Synthroid  Prolonged QT: Will give one dose magnesium and replace potassium.  Avoid QT prolonging agents.  Continue beta-blockers.  Repeat EKG in a.m.  I have seen and examined this patient myself. I have  spent 30 minutes in her evaluation and care.  DVT prophylaxis: SCDs in view of GI bleed Code Status: DNR Family / Patient Communication: Family not present at bedside.  Called and discussed with son [name] [phone] answer, d/w other son [name] at [phone]--updated.  [name] stated patient has 24-hour care at home and would prefer to take mother home when medically optimized. Disposition Plan: Home with home health PT and 24 hour supervision/nursing support. This will not be available until Monday. No dafe discharge is available at this point.  Anakaren Campion, DO [facility] Direct contact: see [email or web address]  7PM-7AM contact night coverage as above 06/28/2019, 3:02 PM  LOS: 6 days

## 2019-06-28 NOTE — Progress Notes (Signed)
Patient's son notified RN that while helping patient to the bathroom, he noticed that stool was dark and also had drops of bright red blood. Stool was flushed prior to staff being able to visualize this. Notified Dr. Benny Lennert; orders were placed for serial H&H lab draws and hemoccult sample to be collected.

## 2019-06-29 ENCOUNTER — Inpatient Hospital Stay (HOSPITAL_COMMUNITY): Payer: Medicare Other

## 2019-06-29 LAB — BASIC METABOLIC PANEL
Anion gap: 8 (ref 5–15)
BUN: 10 mg/dL (ref 8–23)
CO2: 25 mmol/L (ref 22–32)
Calcium: 8.7 mg/dL — ABNORMAL LOW (ref 8.9–10.3)
Chloride: 110 mmol/L (ref 98–111)
Creatinine, Ser: 0.96 mg/dL (ref 0.44–1.00)
GFR calc Af Amer: 58 mL/min — ABNORMAL LOW (ref >60)
GFR calc non Af Amer: 50 mL/min — ABNORMAL LOW (ref >60)
Glucose, Bld: 96 mg/dL (ref 70–99)
Potassium: 3.5 mmol/L (ref 3.5–5.1)
Sodium: 143 mmol/L (ref 135–145)

## 2019-06-29 LAB — HEMOGLOBIN AND HEMATOCRIT, BLOOD
HCT: 30.2 % — ABNORMAL LOW (ref 36.0–46.0)
HCT: 28.4 % — ABNORMAL LOW (ref 36.0–46.0)
Hemoglobin: 9.5 g/dL — ABNORMAL LOW (ref 12.0–15.0)
Hemoglobin: 9.1 g/dL — ABNORMAL LOW (ref 12.0–15.0)

## 2019-06-29 LAB — GLUCOSE, CAPILLARY
Glucose-Capillary: 83 mg/dL (ref 70–99)
Glucose-Capillary: 82 mg/dL (ref 70–99)

## 2019-06-29 LAB — OCCULT BLOOD X 1 CARD TO LAB, STOOL: Fecal Occult Bld: POSITIVE — AB

## 2019-06-29 MED ORDER — HALOPERIDOL LACTATE 5 MG/ML IJ SOLN
5.0000 mg | Freq: Once | INTRAMUSCULAR | Status: AC | PRN
  Administered 2019-06-29: 5 mg via INTRAVENOUS
  Filled 2019-06-29: qty 1

## 2019-06-29 MED ORDER — IOHEXOL 350 MG/ML SOLN
100.0000 mL | Freq: Once | INTRAVENOUS | Status: AC | PRN
  Administered 2019-06-29: 100 mL via INTRAVENOUS

## 2019-06-29 NOTE — Consult Note (Addendum)
Daily Rounding Note  06/29/2019, 10:42 AM  LOS: 11 days   SUBJECTIVE:   Chief complaint: GI bleed. Patient son saw dark stool and some small amount of blood yesterday late afternoon.  Taking Plavix and 325 ASA since stroke in early 05/2019. Presented 10 days ago with hematemesis/CGE. 06/19/2019 EGD 1.5 cm gastric ulcer, not bleeding.  Gastritis.  2 cm HH. 06/21/2019 EGD no bleeding.  HH.  Non bleeding GU, no bleeding stigmata. Recommend EGD in 10 - 12 weeks to document healing of ulcer. Stool H. Pylori Ag negative 12/15.   Protonix in place since her admission but no PPI PTA.  Initially drip, then IV bid, day 3 PO bid currently.  Received 3 PRBCs, 12/11 - 12/13.  Nadir Hgb 7.9 on 12/10.  Baseline early 05/2019 was ~ Hgb 14.   BUN is consistently WNL.   Plavix restarted 12/16.  ASA not restarted.   Late yesterday afternoon the patient's son helped her to the bathroom and saw dark stool and small amounts of blood.  This stool was not visualized by medical or nursing staff as the son flushed toilet getting rid of "evidence".  Hgb 9.5 yest 0430 > 10.2 at 1730 >> 9.5 at 0600 today >> 9.1 at 0945 today BUN still normal at 10  OBJECTIVE:         Vital signs in last 24 hours:    Temp:  [98 F (36.7 C)-98.7 F (37.1 C)] 98.4 F (36.9 C) (12/21 0545) Pulse Rate:  [57-73] 73 (12/21 0545) Resp:  [18] 18 (12/20 1314) BP: (115-165)/(48-58) 165/50 (12/21 0545) SpO2:  [98 %] 98 % (12/21 0545) Weight:  [55.7 kg] 55.7 kg (12/21 0545) Last BM Date: 06/24/19 Filed Weights   06/24/19 0615 06/28/19 0156 06/29/19 0545  Weight: 55.7 kg 56.2 kg 55.7 kg   General: comfortable, NAD, pale   Heart: RRR Chest: clear bil.  No labored breathing or cough Abdomen: soft, NT, ND.  BS hypoactive  Extremities: no CCE Rectal: burgundy, soft, paste textured stool is FOBT positive.  Equivocal as to melena vs prox colonic, distal SB source.     Neuro/Psych:  Follows commands.  confused  Intake/Output from previous day: 12/20 0701 - 12/21 0700 In: 120 [P.O.:120] Out: -   Intake/Output this shift: No intake/output data recorded.  Lab Results: Recent Labs    06/27/19 0428 06/28/19 1720 06/29/19 0611 06/29/19 0947  WBC 3.8*  --   --   --   HGB 9.5* 10.2* 9.5* 9.1*  HCT 29.4* 32.0* 30.2* 28.4*  PLT 145*  --   --   --    BMET Recent Labs    06/27/19 0428 06/29/19 0611  NA 145 143  K 3.8 3.5  CL 110 110  CO2 27 25  GLUCOSE 97 96  BUN 12 10  CREATININE 1.02* 0.96  CALCIUM 8.8* 8.7*     ASSESMENT:   *   GIB, presumed due to gastric ulcer.    Coverage w high dose PPI for last 10 days but recurrent burgundy stools, ? Distal UGI bleed or prox colon bleed?   *   Blood loss anemia. Hgb stable.    Hx B12 def as well.    *   CVA 05/2019.  Plavix restarted, day 7 of this.  325 ASA not restarted.     PLAN   *   CTAP w angio.  NPO for this but restart HH diet after CT.    *  Hold Plavix, d/w and ok with Dr Benny Lennert.      Azucena Freed  06/29/2019, 10:42 AM Phone [phone]     Attending physician's note   I have taken an interval history (not reliable d/t dementia), reviewed the chart and examined the patient. I agree with the Advanced Practitioner's note, impression and recommendations.    GI Bleed likely d/t GU. Had multiple EGDs this month as above. Last EGD 06/21/2019- HH, non bleeding GU.  Scheduled to have repeat EGD in 12 weeks.  Restarted on Plavix 06/24/2019. Hb has been stable 9- 9.5. HD stable. Advanced dementia. H/O CVA 05/2019 with recurrent GI bleeding on Plavix.  Plan: -IV Protonix -Trend Hb -CTA  -Hold Plavix.  May have to switch to ASA -EGD if recurrent bleeding. -Will discuss with patient's son in AM.  Carmell Austria, MD [facility] [phone].

## 2019-06-29 NOTE — Progress Notes (Signed)
PROGRESS NOTE  TAMBRIA SHAM [id number] DOB: 25-May-1922 DOA: 06/18/2019 PCP: [name], NP  Brief History   85 y.o.femalewithhistory of recent stroke on antiplatelet agents, hypertension, dementia, CAD, hypothyroidism was brought to the ER patient's son found that patient has been throwing up blood associated with some nausea and burping noted by family members for 2 days prior to admission.  On the evening of presentation, when she went to the bathroom she was found to be having hematemesis and almost fell onto the floor but patient's son caught her. Did not lose consciousness. Following this episode, patient had another episode of throwing up blood with reported black stools. EMS was called and patient was found to be hypotensive ED Course: Afebrile.Hemoglobin dropped by 6 g of previous of 14 and is presently around 8. Stool for occult blood was positive. Covid test was negative. TFsed 1 unit of PRBC. CT head is unremarkable except for the expected evolution changes of recent stroke. Hospital course: Patient admitted to [facility] with GI consult. Plavix held. GI bleedingfelt to bedue to gastric ulcer. -underwent EGD (12/11): 15 mm gastric ulcer, no bleeding -Repeat EGD 12/13 no obvious source of bleeding noted-TFsed 2U on 12/11. Repeat Hg10.7>10.9.cont PPI. HC complicated by delirium requiring sitter at bedside.  Plavix has been restarted. PT has evaluated the patient and recommends home health PT with 24 hour supervision.  On the afternoon of 06/28/2019 the patient's son got her up to go to the toilet, and reported to nursing, who then reported to me, that she had a dark bowel movement with some bright red blood in the toilet. Her hemoglobin has dropped from 10.2 to 9.1. She is FOBT positive. GI has been consulted. They have ordered a CTA of the abdomen and pelvis to seek a source for bleeding. Plavix has been discontinued again. Consultants  . Gastroenterology . Palliative  care  Procedures  . EGD: Gastric ulcer . Transfusion of 3 units PRBC's  Antibiotics   Anti-infectives (From admission, onward)   None     Subjective  The patient is sitting up in her chair. She is confused, but responsive and follows commands.  Objective   Vitals:  Vitals:   06/29/19 0545 06/29/19 1158  BP: (!) 165/50 (!) 167/70  Pulse: 73 88  Resp:  18  Temp: 98.4 F (36.9 C) 98 F (36.7 C)  SpO2: 98%    Exam:  Constitutional:  . The patient is awake, alert, but confused. No acute distress. Respiratory:  . No increased work of breathing. . No wheezes, rales, or rhonchi . No tactile fremitus Cardiovascular:  . Regular rate and rhythm . No murmurs, ectopy, or gallups. . No lateral PMI. No thrills. Abdomen:  . Abdomen is soft, non-tender, non-distended . No hernias, masses, or organomegaly . Normoactive bowel sounds.  Musculoskeletal:  . No cyanosis, clubbing, or edema Skin:  . No rashes, lesions, ulcers . palpation of skin: no induration or nodules Neurologic:  . CN 2-12 intact . Sensation all 4 extremities intact Psychiatric:  . Mental status: Confused.  I have personally reviewed the following:   Today's Data  . Vitals . CBC . BMP  Scheduled Meds: . sodium chloride   Intravenous Once  . sodium chloride   Intravenous Once  . atorvastatin  80 mg Oral q1800  . donepezil  5 mg Oral QHS  . escitalopram  5 mg Oral QHS  . levothyroxine  88 mcg Oral Q0600  . metoprolol tartrate  50 mg Oral BID  .  pantoprazole  40 mg Oral BID  . potassium chloride  40 mEq Oral Once   Continuous Infusions:  Principal Problem:   Acute GI bleeding Active Problems:   Hypothyroidism   Essential hypertension   Cognitive impairment   CVA (cerebral vascular accident) ([place])   Acute blood loss anemia   Acute gastric ulcer with hemorrhage   Gastric ulcer without hemorrhage or perforation   LOS: 11 days   A & P  Upper GI bleed: Patient had hematemesis/melena on  presentation with acute blood loss anemia.  EGD x2 revealed nonbleeding gastric ulcer likely related to aspirin use.  Required 2 units of PRBC in this hospitalization.  Hemoglobin stabilized around 10.  Patient had CVA last month with ASA increasefrom81 mg to 325 mg at that time as well as addition to Plavix. Last Plavix dose was 12/10.  Okay to resume Plavix today per GI but concern for "maroon stool" reported overnight.  Hemoglobin appears stable again this morning. Will restart Plavix.   Continue Protonix 40 mg twice daily for 8 weeks.  Follow-up H. pylori stool antigen results.  Diet advanced to full liquids but patient not been eating due to confusion/intermittent sedation.Could consider repeat endoscopy in 10-12 weeks vs outpatient monitoring of hemoglobin with repeat endoscopy if any decline per GI and also plan to repeat CBC 7-10 days after discharge to assess for stability.On the afternoon of 06/28/2019 the patient's son got her up to go to the toilet, and reported to nursing, who then reported to me, that she had a dark bowel movement with some bright red blood in the toilet. Her hemoglobin has dropped from 10.2 to 9.1. She is FOBT positive. GI has been consulted. They have ordered a CTA of the abdomen and pelvis to seek a source for bleeding. Plavix has been discontinued again.  Dementia/delirium: Somewhat improved today.  In the setting of acute blood loss anemia, recent stroke and hospitalization. Continue sitter and delirium precautions.  Appears to be a little less confused this morning.  Avoid benzodiazepines in this elderly patient.  Can add melatonin if available through pharmacy will resume home dose of Lexapro/Aricept.  According to son patient only has memory issues at baseline. PT worked with the patient today and has recommended home with HHPT and 24 hour supervision. The patient's family states that they wish to take her home, however they will not be able to do so safely until her home  nursing support will be available on Monday.  History of CVA: Hold aspirin in view of problem #1.  Can resume Plavix in a.m. if hgb stable. Resumed home statins   History of hypertension: Restarted her home BB. Titrate as needed. May need second agent/amlodpine if uncontrolled.  Treat delirium.  SBP improved to 130s today (was up to 170).  Hypothyroidism: Cont Synthroid  Prolonged QT: Will give one dose magnesium and replace potassium.  Avoid QT prolonging agents.  Continue beta-blockers.  Repeat EKG in a.m.  I have seen and examined this patient myself. I have spent 30 minutes in her evaluation and care.  DVT prophylaxis: SCDs in view of GI bleed Code Status: DNR Family / Patient Communication: Family not present at bedside.   Disposition Plan: Home with home health PT and 24 hour supervision/nursing support. This will not be available until Monday. No safe discharge is available at this point due to recurrence of GI bleed.  Evana Runnels, DO [facility] Direct contact: see [email or web address]  7PM-7AM contact night coverage as above  06/29/2019, 3:57 PM  LOS: 6 days

## 2019-06-30 LAB — CBC WITH DIFFERENTIAL/PLATELET
Abs Immature Granulocytes: 0.01 10*3/uL (ref 0.00–0.07)
Basophils Absolute: 0 10*3/uL (ref 0.0–0.1)
Basophils Relative: 1 %
Eosinophils Absolute: 0.2 10*3/uL (ref 0.0–0.5)
Eosinophils Relative: 5 %
HCT: 29.7 % — ABNORMAL LOW (ref 36.0–46.0)
Hemoglobin: 9.6 g/dL — ABNORMAL LOW (ref 12.0–15.0)
Immature Granulocytes: 0 %
Lymphocytes Relative: 34 %
Lymphs Abs: 1.3 10*3/uL (ref 0.7–4.0)
MCH: 29.9 pg (ref 26.0–34.0)
MCHC: 32.3 g/dL (ref 30.0–36.0)
MCV: 92.5 fL (ref 80.0–100.0)
Monocytes Absolute: 0.5 10*3/uL (ref 0.1–1.0)
Monocytes Relative: 13 %
Neutro Abs: 1.9 10*3/uL (ref 1.7–7.7)
Neutrophils Relative %: 47 %
Platelets: 155 10*3/uL (ref 150–400)
RBC: 3.21 MIL/uL — ABNORMAL LOW (ref 3.87–5.11)
RDW: 14.5 % (ref 11.5–15.5)
WBC: 4 10*3/uL (ref 4.0–10.5)
nRBC: 0 % (ref 0.0–0.2)

## 2019-06-30 LAB — BASIC METABOLIC PANEL
Anion gap: 11 (ref 5–15)
BUN: 9 mg/dL (ref 8–23)
CO2: 24 mmol/L (ref 22–32)
Calcium: 8.7 mg/dL — ABNORMAL LOW (ref 8.9–10.3)
Chloride: 106 mmol/L (ref 98–111)
Creatinine, Ser: 0.96 mg/dL (ref 0.44–1.00)
GFR calc Af Amer: 58 mL/min — ABNORMAL LOW (ref >60)
GFR calc non Af Amer: 50 mL/min — ABNORMAL LOW (ref >60)
Glucose, Bld: 77 mg/dL (ref 70–99)
Potassium: 3.3 mmol/L — ABNORMAL LOW (ref 3.5–5.1)
Sodium: 141 mmol/L (ref 135–145)

## 2019-06-30 LAB — GLUCOSE, CAPILLARY
Glucose-Capillary: 72 mg/dL (ref 70–99)
Glucose-Capillary: 114 mg/dL — ABNORMAL HIGH (ref 70–99)
Glucose-Capillary: 88 mg/dL (ref 70–99)

## 2019-06-30 MED ORDER — ASPIRIN EC 81 MG PO TBEC
81.0000 mg | DELAYED_RELEASE_TABLET | Freq: Every day | ORAL | Status: DC
  Administered 2019-06-30 – 2019-07-03 (×3): 81 mg via ORAL
  Filled 2019-06-30 (×3): qty 1

## 2019-06-30 NOTE — Progress Notes (Signed)
PROGRESS NOTE  Maria Clarke [id number] DOB: 01-05-1922 DOA: 06/18/2019 PCP: [name], NP  Brief History   85 y.o.femalewithhistory of recent stroke on antiplatelet agents, hypertension, dementia, CAD, hypothyroidism was brought to the ER patient's son found that patient has been throwing up blood associated with some nausea and burping noted by family members for 2 days prior to admission.  On the evening of presentation, when she went to the bathroom she was found to be having hematemesis and almost fell onto the floor but patient's son caught her. Did not lose consciousness. Following this episode, patient had another episode of throwing up blood with reported black stools. EMS was called and patient was found to be hypotensive ED Course: Afebrile.Hemoglobin dropped by 6 g of previous of 14 and is presently around 8. Stool for occult blood was positive. Covid test was negative. TFsed 1 unit of PRBC. CT head is unremarkable except for the expected evolution changes of recent stroke. Hospital course: Patient admitted to [facility] with GI consult. Plavix held. GI bleedingfelt to bedue to gastric ulcer. -underwent EGD (12/11): 15 mm gastric ulcer, no bleeding -Repeat EGD 12/13 no obvious source of bleeding noted-TFsed 2U on 12/11. Repeat Hg10.7>10.9.cont PPI. HC complicated by delirium requiring sitter at bedside.  Plavix has been restarted. PT has evaluated the patient and recommends home health PT with 24 hour supervision.  On the afternoon of 06/28/2019 the patient's son got her up to go to the toilet, and reported to nursing, who then reported to me, that she had a dark bowel movement with some bright red blood in the toilet. Her hemoglobin has dropped from 10.2 to 9.1. She is FOBT positive. GI has been consulted. They have ordered a CTA of the abdomen and pelvis to seek a source for bleeding. Plavix has been discontinued again.  GI has ordered a CTA of abdomen and pelvis. No  source of bleeding was seen.  Recommendation is to avoid endoscopic procedure on this 85 yr old patient and explore permanent discontinuation of plavix. The patient was started on plavix due to recurrent strokes on full strength aspirin. Shortly there after (about a month) she has begun to have recurrent GI bleeds that have required transfusion of 3 units of PRBC's thus far. I have discussed the patient with Dr. Rodney Booze. He recommends discontinuation of plavix and to switch to low dose asa. He recognizes that there is no good answer here and that we are between a rock and a hard place. Consultants  . Gastroenterology . Palliative care  Procedures  . EGD: Gastric ulcer . Transfusion of 3 units PRBC's  Antibiotics   Anti-infectives (From admission, onward)   None     Subjective  The patient is resting quietly. Son is at bedside. No new complaints.   Objective   Vitals:  Vitals:   06/30/19 0645 06/30/19 1605  BP: (!) 158/58 (!) 170/52  Pulse:  (!) 51  Resp:  19  Temp: 98.5 F (36.9 C) 98.4 F (36.9 C)  SpO2: 100% 99%   Exam:  Constitutional:  . The patient is somnolent. No acute distress. Respiratory:  . No increased work of breathing. . No wheezes, rales, or rhonchi . No tactile fremitus Cardiovascular:  . Regular rate and rhythm . No murmurs, ectopy, or gallups. . No lateral PMI. No thrills. Abdomen:  . Abdomen is soft, non-tender, non-distended . No hernias, masses, or organomegaly . Normoactive bowel sounds.  Musculoskeletal:  . No cyanosis, clubbing, or edema Skin:  .  No rashes, lesions, ulcers . palpation of skin: no induration or nodules Neurologic:  . CN 2-12 intact . Sensation all 4 extremities intact Psychiatric:  . Mental status: Confused.  I have personally reviewed the following:   Today's Data  . Vitals . CBC . BMP  Scheduled Meds: . sodium chloride   Intravenous Once  . sodium chloride   Intravenous Once  . aspirin EC  81 mg Oral  Daily  . atorvastatin  80 mg Oral q1800  . donepezil  5 mg Oral QHS  . escitalopram  5 mg Oral QHS  . levothyroxine  88 mcg Oral Q0600  . metoprolol tartrate  50 mg Oral BID  . pantoprazole  40 mg Oral BID  . potassium chloride  40 mEq Oral Once   Continuous Infusions:  Principal Problem:   Acute GI bleeding Active Problems:   Hypothyroidism   Essential hypertension   Cognitive impairment   CVA (cerebral vascular accident) ([place])   Acute blood loss anemia   Acute gastric ulcer with hemorrhage   Gastric ulcer without hemorrhage or perforation   LOS: 12 days   A & P  Upper GI bleed: Patient had hematemesis/melena on presentation with acute blood loss anemia.  EGD x2 revealed nonbleeding gastric ulcer likely related to aspirin use.  Required 2 units of PRBC in this hospitalization.  Hemoglobin stabilized around 10.  Patient had CVA last month with ASA increasefrom81 mg to 325 mg at that time as well as addition to Plavix. Last Plavix dose was 12/10.  Okay to resume Plavix today per GI but concern for "maroon stool" reported overnight.  Hemoglobin appears stable again this morning. Will restart Plavix.   Continue Protonix 40 mg twice daily for 8 weeks.  Follow-up H. pylori stool antigen results.  Diet advanced to full liquids but patient not been eating due to confusion/intermittent sedation.Could consider repeat endoscopy in 10-12 weeks vs outpatient monitoring of hemoglobin with repeat endoscopy if any decline per GI and also plan to repeat CBC 7-10 days after discharge to assess for stability.On the afternoon of 06/28/2019 the patient's son got her up to go to the toilet, and reported to nursing, who then reported to me, that she had a dark bowel movement with some bright red blood in the toilet. Her hemoglobin has dropped from 10.2 to 9.1. She is FOBT positive. GI has been consulted. They have ordered a CTA of the abdomen and pelvis to seek a source for bleeding. No source of bleeding was  seen. Recommendation is to avoid endoscopic procedure on this 85 yr old patient and explore permanent discontinuation of plavix. The patient was started on plavix due to recurrent strokes on full strength aspirin. Shortly there after (about a month) she has begun to have recurrent GI bleeds that have required transfusion of 3 units of PRBC's thus far. I have discussed the patient with Dr. Rodney Booze. He recommends discontinuation of plavix and to switch to low dose asa. I have also discussed this with the patient's son who is at bedside. He recognizes that there is no good answer here and that we are between a rock and a hard place. Monitor hemoglobin. Home soon.  Dementia/delirium:  In the setting of acute blood loss anemia, recent stroke and hospitalization. Continue sitter and delirium precautions.  Appears to be a little less confused this morning.  Avoid benzodiazepines in this elderly patient.  Can add melatonin if available through pharmacy will resume home dose of Lexapro/Aricept.  According  to son patient only has memory issues at baseline. PT worked with the patient today and has recommended home with HHPT and 24 hour supervision. The patient's family states that they wish to take her home, however they will not be able to do so safely until her home nursing support will be available on Monday.  History of CVA: Hold aspirin in view of problem #1.  Can resume Plavix in a.m. if hgb stable. Resumed home statins   History of hypertension: Restarted her home BB. Titrate as needed. May need second agent/amlodpine if uncontrolled.  Treat delirium.  SBP improved to 130s today (was up to 170).  Hypothyroidism: Cont Synthroid  Prolonged QT: Will give one dose magnesium and replace potassium.  Avoid QT prolonging agents.  Continue beta-blockers.  Repeat EKG in a.m.  I have seen and examined this patient myself. I have spent 45 minutes in her evaluation and care. More than 50% of this time has been  spent in coordination of care with neurology and in consultation with the patient's son.  DVT prophylaxis: SCDs in view of GI bleed Code Status: DNR Family / Patient Communication: Family not present at bedside.   Disposition Plan: Home with home health PT and 24 hour supervision/nursing support. Possibly for discharge tomorrow.  Khristin Keleher, DO [facility] Direct contact: see [email or web address]  7PM-7AM contact night coverage as above 06/30/2019, 4:15 PM  LOS: 6 days

## 2019-06-30 NOTE — Progress Notes (Addendum)
Daily Rounding Note  06/30/2019, 8:24 AM  LOS: 12 days   SUBJECTIVE:   Chief complaint: burgundy bloody stools    Smear of black/red stool ~ 2200 yesterday,  0630 today Denies abdominal pain.  No nausea.  OBJECTIVE:         Vital signs in last 24 hours:    Temp:  [98 F (36.7 C)-98.5 F (36.9 C)] 98.5 F (36.9 C) (12/22 0645) Pulse Rate:  [60-88] 60 (12/21 2107) Resp:  [18] 18 (12/21 1158) BP: (158-191)/(58-70) 158/58 (12/22 0645) SpO2:  [100 %] 100 % (12/22 0645) Weight:  [55.2 kg] 55.2 kg (12/22 0645) Last BM Date: 06/28/19 Filed Weights   06/28/19 0156 06/29/19 0545 06/30/19 0645  Weight: 56.2 kg 55.7 kg 55.2 kg   General: sleeping but awakens w minor effort   Heart: RRR Chest: clear, non-labored breathing Abdomen: soft, quiet, NT, ND  Extremities: no CCE Neuro/Psych:  Calm, cooperative, oriented to herself.    Intake/Output from previous day: 12/21 0701 - 12/22 0700 In: 160 [P.O.:160] Out: 100 [Urine:100]  Intake/Output this shift: No intake/output data recorded.  Lab Results: Recent Labs    06/29/19 0611 06/29/19 0947 06/30/19 0418  WBC  --   --  4.0  HGB 9.5* 9.1* 9.6*  HCT 30.2* 28.4* 29.7*  PLT  --   --  155   BMET Recent Labs    06/29/19 0611 06/30/19 0418  NA 143 141  K 3.5 3.3*  CL 110 106  CO2 25 24  GLUCOSE 96 77  BUN 10 9  CREATININE 0.96 0.96  CALCIUM 8.7* 8.7*   LFT No results for input(s): PROT, ALBUMIN, AST, ALT, ALKPHOS, BILITOT, BILIDIR, IBILI in the last 72 hours. PT/INR No results for input(s): LABPROT, INR in the last 72 hours. Hepatitis Panel No results for input(s): HEPBSAG, HCVAB, HEPAIGM, HEPBIGM in the last 72 hours.  Studies/Results: CT Angio Abd/Pel w/ and/or w/o  Result Date: 06/30/2019 CLINICAL DATA:  GI bleed. Gastric ulcer. On anticoagulation due to previous stroke. EXAM: CTA ABDOMEN AND PELVIS WITHOUT AND WITH CONTRAST TECHNIQUE: Multidetector  CT imaging of the abdomen and pelvis was performed using the standard protocol during bolus administration of intravenous contrast. Multiplanar reconstructed images and MIPs were obtained and reviewed to evaluate the vascular anatomy. CONTRAST:  124mL OMNIPAQUE IOHEXOL 350 MG/ML SOLN COMPARISON:  08/11/2008 FINDINGS: VASCULAR Coronary calcifications.  Heavy aortic valve leaflet calcifications. Aorta: Extensive calcified atheromatous plaque. No aneurysm, dissection, or stenosis. Celiac: Calcified ostial plaque resulting in only mild stenosis. Scattered calcified plaque distally. SMA: Calcified plaque extending from the ostium over length of approximately 1.5 cm without high-grade stenosis, patent distally with classic distal branch anatomy. Renals: Single left, with eccentric partially calcified ostial plaque resulting in mild short-segment stenosis, patent distally. Single right, with partially calcified ostial plaque resulting in short segment stenosis of mild severity, patent distally. IMA: Patent without evidence of aneurysm, dissection, vasculitis or significant stenosis. Inflow: Scattered calcified nonocclusive plaque. No aneurysm, dissection, or stenosis. Proximal Outflow: Bilateral common femoral and visualized portions of the superficial and profunda femoral arteries are patent without evidence of aneurysm, dissection, vasculitis or significant stenosis. Veins: Patent hepatic veins, portal vein, SM V, IM V, splenic vein, bilateral renal veins, IVC. No venous pathology identified. Review of the MIP images confirms the above findings. NON-VASCULAR Lower chest: Trace left pleural effusion. Minimal atelectasis posteriorly at the left lung base. Hepatobiliary: No focal liver abnormality is seen. No gallstones,  gallbladder wall thickening, or biliary dilatation. Pancreas: Mild diffuse atrophy without mass or ductal dilatation. Spleen: Normal size.  Scattered calcified granulomas. Adrenals/Urinary Tract: Normal  adrenal glands. Left nephrolithiasis, largest stone or cluster 1.4 cm in the lower pole. Normal renal parenchymal enhancement without mass. No hydronephrosis or ureterectasis. Urinary bladder nondistended. Stomach/Bowel: No active extravasation evident. The stomach is nondistended. Small bowel is nondilated. Appendix not identified. The colon is incompletely distended, with scattered diverticula in the sigmoid segment without adjacent inflammatory/edematous change or abscess. Lymphatic: Prominent aortocaval lymph nodes up to 8 mm short axis diameter. No mesenteric or pelvic adenopathy. Reproductive: Uterus and bilateral adnexa are unremarkable. Other: Trace free pelvic fluid.  No abdominal ascites.  No free air. Musculoskeletal: Multilevel thoracolumbar spondylitic change. Bilateral hip DJD. No fracture or worrisome bone lesion. IMPRESSION: 1. No active extravasation or other acute  findings. 2. Trace left pleural effusion. 3. Sigmoid diverticulosis 4. Left nephrolithiasis Electronically Signed   By: Lucrezia Europe M.D.   On: 06/30/2019 08:05   Scheduled Meds: . sodium chloride   Intravenous Once  . sodium chloride   Intravenous Once  . atorvastatin  80 mg Oral q1800  . donepezil  5 mg Oral QHS  . escitalopram  5 mg Oral QHS  . levothyroxine  88 mcg Oral Q0600  . metoprolol tartrate  50 mg Oral BID  . pantoprazole  40 mg Oral BID  . potassium chloride  40 mEq Oral Once   Continuous Infusions: PRN Meds:.acetaminophen **OR** acetaminophen, morphine injection, ondansetron **OR** [DISCONTINUED] ondansetron (ZOFRAN) IV   ASSESMENT:   *   GIB in pt on Plavix.  Non bleeding GU, gastritis on EGDs x 2.   Bid PPI in place many days.  Stool H Pylori negative.  BUN consistently wnl.   unrevealling CTA 12/21, sigmoid diverticulosis but no active bleeding or worrisome GI findings.     *   Blood loss anemia.  S/p PRBC x 3. Hgb stable since recurrent bleeding 12/20.     *   Advanced dementia.    *   Chronic  plavix since 05/2019 CVA.  Was held for a few days, restarted 12/16 - 12/21.  Full dose ASA not restarted.     PLAN   *   Repeat EGD vs enteroscopy?  Prefer to avoid colonoscopy in this 85 y/o with co-morbidities who would require NGT to infuse bowel prep.    *   Reconsider risk/benefit of Plavix.      Azucena Freed  06/30/2019, 8:24 AM Phone [phone]    Attending physician's note   I have taken an interval history, reviewed the chart and examined the patient. I agree with the Advanced Practitioner's note, impression and recommendations.   CTA neg Had melanotic stools this morning.  Some mixed with bright red blood. Hb stable. Plavix on hold  Plan: Proceed with EGD in a.m. Continue Protonix Would like to avoid colonoscopy d/t advanced age and comorbid conditions (unless absolutely necessary) Left message for Tommi Emery [phone]   Carmell Austria, MD [place] [phone].

## 2019-06-30 NOTE — H&P (View-Only) (Signed)
Daily Rounding Note  06/30/2019, 8:24 AM  LOS: 12 days   SUBJECTIVE:   Chief complaint: burgundy bloody stools    Smear of black/red stool ~ 2200 yesterday,  0630 today Denies abdominal pain.  No nausea.  OBJECTIVE:         Vital signs in last 24 hours:    Temp:  [98 F (36.7 C)-98.5 F (36.9 C)] 98.5 F (36.9 C) (12/22 0645) Pulse Rate:  [60-88] 60 (12/21 2107) Resp:  [18] 18 (12/21 1158) BP: (158-191)/(58-70) 158/58 (12/22 0645) SpO2:  [100 %] 100 % (12/22 0645) Weight:  [55.2 kg] 55.2 kg (12/22 0645) Last BM Date: 06/28/19 Filed Weights   06/28/19 0156 06/29/19 0545 06/30/19 0645  Weight: 56.2 kg 55.7 kg 55.2 kg   General: sleeping but awakens w minor effort   Heart: RRR Chest: clear, non-labored breathing Abdomen: soft, quiet, NT, ND  Extremities: no CCE Neuro/Psych:  Calm, cooperative, oriented to herself.    Intake/Output from previous day: 12/21 0701 - 12/22 0700 In: 160 [P.O.:160] Out: 100 [Urine:100]  Intake/Output this shift: No intake/output data recorded.  Lab Results: Recent Labs    06/29/19 0611 06/29/19 0947 06/30/19 0418  WBC  --   --  4.0  HGB 9.5* 9.1* 9.6*  HCT 30.2* 28.4* 29.7*  PLT  --   --  155   BMET Recent Labs    06/29/19 0611 06/30/19 0418  NA 143 141  K 3.5 3.3*  CL 110 106  CO2 25 24  GLUCOSE 96 77  BUN 10 9  CREATININE 0.96 0.96  CALCIUM 8.7* 8.7*   LFT No results for input(s): PROT, ALBUMIN, AST, ALT, ALKPHOS, BILITOT, BILIDIR, IBILI in the last 72 hours. PT/INR No results for input(s): LABPROT, INR in the last 72 hours. Hepatitis Panel No results for input(s): HEPBSAG, HCVAB, HEPAIGM, HEPBIGM in the last 72 hours.  Studies/Results: CT Angio Abd/Pel w/ and/or w/o  Result Date: 06/30/2019 CLINICAL DATA:  GI bleed. Gastric ulcer. On anticoagulation due to previous stroke. EXAM: CTA ABDOMEN AND PELVIS WITHOUT AND WITH CONTRAST TECHNIQUE: Multidetector  CT imaging of the abdomen and pelvis was performed using the standard protocol during bolus administration of intravenous contrast. Multiplanar reconstructed images and MIPs were obtained and reviewed to evaluate the vascular anatomy. CONTRAST:  166mL OMNIPAQUE IOHEXOL 350 MG/ML SOLN COMPARISON:  08/11/2008 FINDINGS: VASCULAR Coronary calcifications.  Heavy aortic valve leaflet calcifications. Aorta: Extensive calcified atheromatous plaque. No aneurysm, dissection, or stenosis. Celiac: Calcified ostial plaque resulting in only mild stenosis. Scattered calcified plaque distally. SMA: Calcified plaque extending from the ostium over length of approximately 1.5 cm without high-grade stenosis, patent distally with classic distal branch anatomy. Renals: Single left, with eccentric partially calcified ostial plaque resulting in mild short-segment stenosis, patent distally. Single right, with partially calcified ostial plaque resulting in short segment stenosis of mild severity, patent distally. IMA: Patent without evidence of aneurysm, dissection, vasculitis or significant stenosis. Inflow: Scattered calcified nonocclusive plaque. No aneurysm, dissection, or stenosis. Proximal Outflow: Bilateral common femoral and visualized portions of the superficial and profunda femoral arteries are patent without evidence of aneurysm, dissection, vasculitis or significant stenosis. Veins: Patent hepatic veins, portal vein, SM V, IM V, splenic vein, bilateral renal veins, IVC. No venous pathology identified. Review of the MIP images confirms the above findings. NON-VASCULAR Lower chest: Trace left pleural effusion. Minimal atelectasis posteriorly at the left lung base. Hepatobiliary: No focal liver abnormality is seen. No gallstones,  gallbladder wall thickening, or biliary dilatation. Pancreas: Mild diffuse atrophy without mass or ductal dilatation. Spleen: Normal size.  Scattered calcified granulomas. Adrenals/Urinary Tract: Normal  adrenal glands. Left nephrolithiasis, largest stone or cluster 1.4 cm in the lower pole. Normal renal parenchymal enhancement without mass. No hydronephrosis or ureterectasis. Urinary bladder nondistended. Stomach/Bowel: No active extravasation evident. The stomach is nondistended. Small bowel is nondilated. Appendix not identified. The colon is incompletely distended, with scattered diverticula in the sigmoid segment without adjacent inflammatory/edematous change or abscess. Lymphatic: Prominent aortocaval lymph nodes up to 8 mm short axis diameter. No mesenteric or pelvic adenopathy. Reproductive: Uterus and bilateral adnexa are unremarkable. Other: Trace free pelvic fluid.  No abdominal ascites.  No free air. Musculoskeletal: Multilevel thoracolumbar spondylitic change. Bilateral hip DJD. No fracture or worrisome bone lesion. IMPRESSION: 1. No active extravasation or other acute  findings. 2. Trace left pleural effusion. 3. Sigmoid diverticulosis 4. Left nephrolithiasis Electronically Signed   By: Lucrezia Europe M.D.   On: 06/30/2019 08:05   Scheduled Meds: . sodium chloride   Intravenous Once  . sodium chloride   Intravenous Once  . atorvastatin  80 mg Oral q1800  . donepezil  5 mg Oral QHS  . escitalopram  5 mg Oral QHS  . levothyroxine  88 mcg Oral Q0600  . metoprolol tartrate  50 mg Oral BID  . pantoprazole  40 mg Oral BID  . potassium chloride  40 mEq Oral Once   Continuous Infusions: PRN Meds:.acetaminophen **OR** acetaminophen, morphine injection, ondansetron **OR** [DISCONTINUED] ondansetron (ZOFRAN) IV   ASSESMENT:   *   GIB in pt on Plavix.  Non bleeding GU, gastritis on EGDs x 2.   Bid PPI in place many days.  Stool H Pylori negative.  BUN consistently wnl.   unrevealling CTA 12/21, sigmoid diverticulosis but no active bleeding or worrisome GI findings.     *   Blood loss anemia.  S/p PRBC x 3. Hgb stable since recurrent bleeding 12/20.     *   Advanced dementia.    *   Chronic  plavix since 05/2019 CVA.  Was held for a few days, restarted 12/16 - 12/21.  Full dose ASA not restarted.     PLAN   *   Repeat EGD vs enteroscopy?  Prefer to avoid colonoscopy in this 85 y/o with co-morbidities who would require NGT to infuse bowel prep.    *   Reconsider risk/benefit of Plavix.      Azucena Freed  06/30/2019, 8:24 AM Phone [phone]    Attending physician's note   I have taken an interval history, reviewed the chart and examined the patient. I agree with the Advanced Practitioner's note, impression and recommendations.   CTA neg Had melanotic stools this morning.  Some mixed with bright red blood. Hb stable. Plavix on hold  Plan: Proceed with EGD in a.m. Continue Protonix Would like to avoid colonoscopy d/t advanced age and comorbid conditions (unless absolutely necessary) Left message for Tommi Emery [phone]   Carmell Austria, MD [place] [phone].

## 2019-07-01 ENCOUNTER — Encounter (HOSPITAL_COMMUNITY)
Admission: EM | Disposition: A | Discharge status: Home Health | Payer: Self-pay | Source: Home / Self Care | Attending: Internal Medicine

## 2019-07-01 ENCOUNTER — Inpatient Hospital Stay (HOSPITAL_COMMUNITY): Payer: Medicare Other | Admitting: Certified Registered Nurse Anesthetist

## 2019-07-01 ENCOUNTER — Other Ambulatory Visit: Payer: Self-pay | Admitting: Physician Assistant

## 2019-07-01 DIAGNOSIS — K449 Diaphragmatic hernia without obstruction or gangrene: Secondary | ICD-10-CM

## 2019-07-01 HISTORY — PX: ESOPHAGOGASTRODUODENOSCOPY (EGD) WITH PROPOFOL: SHX5813

## 2019-07-01 HISTORY — PX: BIOPSY: SHX5522

## 2019-07-01 LAB — CBC WITH DIFFERENTIAL/PLATELET
Abs Immature Granulocytes: 0.04 10*3/uL (ref 0.00–0.07)
Basophils Absolute: 0 10*3/uL (ref 0.0–0.1)
Basophils Relative: 1 %
Eosinophils Absolute: 0.2 10*3/uL (ref 0.0–0.5)
Eosinophils Relative: 4 %
HCT: 33.9 % — ABNORMAL LOW (ref 36.0–46.0)
Hemoglobin: 10.9 g/dL — ABNORMAL LOW (ref 12.0–15.0)
Immature Granulocytes: 1 %
Lymphocytes Relative: 25 %
Lymphs Abs: 1.1 10*3/uL (ref 0.7–4.0)
MCH: 29.5 pg (ref 26.0–34.0)
MCHC: 32.2 g/dL (ref 30.0–36.0)
MCV: 91.9 fL (ref 80.0–100.0)
Monocytes Absolute: 0.3 10*3/uL (ref 0.1–1.0)
Monocytes Relative: 7 %
Neutro Abs: 2.7 10*3/uL (ref 1.7–7.7)
Neutrophils Relative %: 62 %
Platelets: 195 10*3/uL (ref 150–400)
RBC: 3.69 MIL/uL — ABNORMAL LOW (ref 3.87–5.11)
RDW: 14.6 % (ref 11.5–15.5)
WBC: 4.4 10*3/uL (ref 4.0–10.5)
nRBC: 0 % (ref 0.0–0.2)

## 2019-07-01 LAB — BASIC METABOLIC PANEL
Anion gap: 11 (ref 5–15)
BUN: 10 mg/dL (ref 8–23)
CO2: 26 mmol/L (ref 22–32)
Calcium: 8.8 mg/dL — ABNORMAL LOW (ref 8.9–10.3)
Chloride: 105 mmol/L (ref 98–111)
Creatinine, Ser: 1.1 mg/dL — ABNORMAL HIGH (ref 0.44–1.00)
GFR calc Af Amer: 49 mL/min — ABNORMAL LOW (ref >60)
GFR calc non Af Amer: 43 mL/min — ABNORMAL LOW (ref >60)
Glucose, Bld: 100 mg/dL — ABNORMAL HIGH (ref 70–99)
Potassium: 4 mmol/L (ref 3.5–5.1)
Sodium: 142 mmol/L (ref 135–145)

## 2019-07-01 SURGERY — ESOPHAGOGASTRODUODENOSCOPY (EGD) WITH PROPOFOL
Anesthesia: Monitor Anesthesia Care

## 2019-07-01 MED ORDER — HYDRALAZINE HCL 20 MG/ML IJ SOLN: 10.0000 mg | Freq: Three times a day (TID) | INTRAMUSCULAR | Status: DC | PRN

## 2019-07-01 MED ORDER — SODIUM CHLORIDE 0.9 % IV SOLN: INTRAVENOUS | Status: DC

## 2019-07-01 MED ORDER — EPHEDRINE SULFATE-NACL 50-0.9 MG/10ML-% IV SOSY
PREFILLED_SYRINGE | INTRAVENOUS | Status: DC | PRN
  Administered 2019-07-01: 15 mg via INTRAVENOUS

## 2019-07-01 MED ORDER — PROPOFOL 500 MG/50ML IV EMUL
INTRAVENOUS | Status: DC | PRN
  Administered 2019-07-01: 75 ug/kg/min via INTRAVENOUS

## 2019-07-01 MED ORDER — PROPOFOL 10 MG/ML IV BOLUS
INTRAVENOUS | Status: DC | PRN
  Administered 2019-07-01 (×2): 15 mg via INTRAVENOUS
  Administered 2019-07-01: 10 mg via INTRAVENOUS

## 2019-07-01 SURGICAL SUPPLY — 15 items

## 2019-07-01 NOTE — Interval H&P Note (Signed)
History and Physical Interval Note:  07/01/2019 10:58 AM  Maria Clarke  has presented today for surgery, with the diagnosis of GI bleed.  The various methods of treatment have been discussed with the patient and family. After consideration of risks, benefits and other options for treatment, the patient has consented to  Procedure(s): ESOPHAGOGASTRODUODENOSCOPY (EGD) WITH PROPOFOL (N/A) BIOPSY as a surgical intervention.  The patient's history has been reviewed, patient examined, no change in status, stable for surgery.  I have reviewed the patient's chart and labs.  Questions were answered to the patient's satisfaction.     Jackquline Denmark

## 2019-07-01 NOTE — Transfer of Care (Signed)
Immediate Anesthesia Transfer of Care Note  Patient: Maria Clarke  Procedure(s) Performed: ESOPHAGOGASTRODUODENOSCOPY (EGD) WITH PROPOFOL (N/A ) BIOPSY  Patient Location: Endoscopy Unit  Anesthesia Type:MAC  Level of Consciousness: drowsy and responds to stimulation  Airway & Oxygen Therapy: Patient Spontanous Breathing  Post-op Assessment: Report given to RN and Post -op Vital signs reviewed and stable  Post vital signs: Reviewed and stable  Last Vitals:  Vitals Value Taken Time  BP 181/43 07/01/19 1055  Temp    Pulse 64 07/01/19 1055  Resp 17 07/01/19 1055  SpO2 96 % 07/01/19 1055  Vitals shown include unvalidated device data.  Last Pain:  Vitals:   07/01/19 0954  TempSrc: Temporal  PainSc: 0-No pain      Patients Stated Pain Goal: 0 (09/40/76 8088)  Complications: No apparent anesthesia complications

## 2019-07-01 NOTE — Anesthesia Postprocedure Evaluation (Signed)
Anesthesia Post Note  Patient: MATTIA OSTERMAN  Procedure(s) Performed: ESOPHAGOGASTRODUODENOSCOPY (EGD) WITH PROPOFOL (N/A ) BIOPSY     Patient location during evaluation: Endoscopy Anesthesia Type: MAC Level of consciousness: awake and alert Pain management: pain level controlled Vital Signs Assessment: post-procedure vital signs reviewed and stable Respiratory status: spontaneous breathing, nonlabored ventilation, respiratory function stable and patient connected to nasal cannula oxygen Cardiovascular status: stable and blood pressure returned to baseline Postop Assessment: no apparent nausea or vomiting Anesthetic complications: no    Last Vitals:  Vitals:   07/01/19 1056 07/01/19 1106  BP: (!) 181/43 (!) 165/51  Pulse: 64 68  Resp: 17 16  Temp: 36.6 C   SpO2: 96% 96%    Last Pain:  Vitals:   07/01/19 1106  TempSrc:   PainSc: 0-No pain                 Marranda Arakelian,W. EDMOND

## 2019-07-01 NOTE — Op Note (Signed)
[facility] Patient Name: Maria Clarke Procedure Date : 07/01/2019 MRN: [id number] Attending MD: Jackquline Denmark , MD Date of Birth: [birth year] CSN: [id number] Age: 85 Admit Type: Inpatient Procedure:                Upper GI endoscopy Indications:              Melena with recurrent bleeding quiring multiple                            blood transfusions. Readmission due to GI bleeding                            s/p 3U PRBC this admission. Providers:                Jackquline Denmark, MD, Josie Dixon, RN, Janeece Agee,                            Technician, Cherylynn Ridges, Technician Referring MD:              Medicines:                Monitored Anesthesia Care Complications:            No immediate complications. Estimated Blood Loss:     Estimated blood loss: none. Procedure:                Pre-Anesthesia Assessment:                           - Prior to the procedure, a History and Physical                            was performed, and patient medications and                            allergies were reviewed. The patient's tolerance of                            previous anesthesia was also reviewed. The risks                            and benefits of the procedure and the sedation                            options and risks were discussed with the patient.                            All questions were answered, and informed consent                            was obtained. Prior Anticoagulants: The patient has                            taken Plavix (clopidogrel), last dose was 3 days  prior to procedure. ASA Grade Assessment: III - A                            patient with severe systemic disease. After                            reviewing the risks and benefits, the patient was                            deemed in satisfactory condition to undergo the                            procedure.                           After obtaining informed  consent, the endoscope was                            passed under direct vision. Throughout the                            procedure, the patient's blood pressure, pulse, and                            oxygen saturations were monitored continuously. The                            [id number] Olympus gastroscope was                            introduced through the mouth, and advanced to the                            second part of duodenum. The upper GI endoscopy was                            accomplished without difficulty. The patient                            tolerated the procedure well. Scope In: Scope Out: Findings:      The examined esophagus was normal. Z line well-defined at 38 cm.      A 2 cm hiatal hernia was present.      One non-bleeding deep, cratered 15 mm gastric ulcer with no stigmata of       bleeding was found in the prepyloric region of the stomach with       surrounding erythema. Biopsies were taken with a cold forceps for       histology. Estimated blood loss: none. Pylorus was slightly deformed. No       outlet obstruction.      The examined duodenum was normal. Impression:               -Gastric ulcer                           -2  cm hiatal hernia. Recommendation:           - Patient has a contact number available for                            emergencies. The signs and symptoms of potential                            delayed complications were discussed with the                            patient. Return to normal activities tomorrow.                            Written discharge instructions were provided to the                            patient.                           - Resume previous diet.                           - At D/C, Protonix 40 mg p.o. twice daily x 12                            weeks, then QD                           - Due to recurrent bleeding after Plavix and                            multiple admissions, would recommend to hold                             Plavix. Can use enteric-coated baby aspirin at                            discharge. This does increase the risk of stroke                            but the risk of bleeding is imminent and                            life-threatening. I have discussed extensively with                            patient's son Maria Clarke who understands and agrees with                            the plan.                           - No ibuprofen, naproxen, or other non-steroidal  anti-inflammatory drugs.                           - Await pathology results.                           - Repeat upper endoscopy in 12 weeks to evaluate                            the response to therapy.                           - D/W Maria Clarke. Procedure Code(s):        --- Professional ---                           [phone], Esophagogastroduodenoscopy, flexible,                            transoral; with biopsy, single or multiple Diagnosis Code(s):        --- Professional ---                           K44.9, Diaphragmatic hernia without obstruction or                            gangrene                           K25.9, Gastric ulcer, unspecified as acute or                            chronic, without hemorrhage or perforation                           K92.1, Melena (includes Hematochezia) CPT copyright 2019 [employer]. All rights reserved. The codes documented in this report are preliminary and upon coder review may  be revised to meet current compliance requirements. Jackquline Denmark, MD 07/01/2019 11:19:47 AM This report has been signed electronically. Number of Addenda: 0

## 2019-07-01 NOTE — Anesthesia Preprocedure Evaluation (Addendum)
Anesthesia Evaluation  Patient identified by MRN, date of birth, ID band Patient awake    Reviewed: Allergy & Precautions, H&P , NPO status , Patient's Chart, lab work & pertinent test results, reviewed documented beta blocker date and time   Airway Mallampati: II  TM Distance: >3 FB Neck ROM: Full    Dental no notable dental hx. (+) Teeth Intact, Dental Advisory Given   Pulmonary neg pulmonary ROS,    Pulmonary exam normal breath sounds clear to auscultation       Cardiovascular hypertension, Pt. on medications and Pt. on home beta blockers + Valvular Problems/Murmurs AS  Rhythm:Regular Rate:Normal + Systolic murmurs    Neuro/Psych Depression negative neurological ROS     GI/Hepatic Neg liver ROS, PUD,   Endo/Other  Hypothyroidism   Renal/GU negative Renal ROS  negative genitourinary   Musculoskeletal   Abdominal   Peds  Hematology  (+) Blood dyscrasia, anemia ,   Anesthesia Other Findings   Reproductive/Obstetrics negative OB ROS                             Anesthesia Physical Anesthesia Plan  ASA: III  Anesthesia Plan: MAC   Post-op Pain Management:    Induction: Intravenous  PONV Risk Score and Plan: 2 and Propofol infusion and Treatment may vary due to age or medical condition  Airway Management Planned: Nasal Cannula  Additional Equipment:   Intra-op Plan:   Post-operative Plan:   Informed Consent: I have reviewed the patients History and Physical, chart, labs and discussed the procedure including the risks, benefits and alternatives for the proposed anesthesia with the patient or authorized representative who has indicated his/her understanding and acceptance.   Patient has DNR.  Discussed DNR with patient and Suspend DNR.   Dental advisory given  Plan Discussed with: CRNA  Anesthesia Plan Comments:        Anesthesia Quick Evaluation

## 2019-07-01 NOTE — Anesthesia Procedure Notes (Signed)
Procedure Name: MAC Performed by: Celinda Dethlefs, Myrical Andujo T, CRNA Pre-anesthesia Checklist: Patient identified, Emergency Drugs available, Suction available and Patient being monitored Patient Re-evaluated:Patient Re-evaluated prior to induction Oxygen Delivery Method: Nasal cannula       

## 2019-07-01 NOTE — Progress Notes (Signed)
[name]    Maria Clarke  [id number] DOB: 02/20/1922 DOA: 06/18/2019 PCP: [name], NP   Brief Narrative:   85 y.o.femalewithhistory of recent stroke on antiplatelet agents, hypertension, dementia, CAD, hypothyroidism was brought to the ER patient's son found that patient has been throwing up blood associated with some nausea and burping noted by family members for 2 days prior to admission. On the evening of presentation, when she went to the bathroom she was found to be having hematemesis and almost fell onto the floor but patient's son caught her. Did not lose consciousness. Following this episode, patient had another episode of throwing up blood with reported black stools. EMS was called and patient was found to be hypotensive  07/01/19: Denies complaints today. To go for scope today.    Assessment & Plan:   Principal Problem:   Acute GI bleeding Active Problems:   Hypothyroidism   Essential hypertension   Cognitive impairment   CVA (cerebral vascular accident) ([place])   Acute blood loss anemia   Acute gastric ulcer with hemorrhage   Gastric ulcer without hemorrhage or perforation  Upper GI bleed     - Patient had hematemesis/melena on presentation with acute blood loss anemia.      - EGD x2 revealed nonbleeding gastric ulcer likely related to aspirin use.      - Required 2 units of PRBC in this hospitalization.      - Patient had CVA last month with ASA increasefrom81 mg to 325 mg at that time as well as addition to Plavix.      - GI would like to avoice c-scope     - 12/23 EGD: One non-bleeding deep, cratered 15 mm gastric ulcer with no stigmata of bleeding was found in the prepyloric region of the stomach with surrounding erythema. Biopsies were taken with a cold forceps for histology. Estimated blood loss: none. Pylorus was slightly deformed. No outlet obstruction.     - previous physician discussed the patient with Dr. Rodney Booze. He recommends  discontinuation of plavix and to switch to low dose asa  Dementia/delirium:      - In the setting of acute blood loss anemia, recent stroke and hospitalization.      - Continue sitter and delirium precautions.      - Avoid benzodiazepines in this elderly patient.     - Can add melatonin if available through pharmacywill resume home dose of Lexapro/Aricept.     - According to son patient only has memory issues at baseline.      - PT worked with the patient today and has recommended home with HHPT and 24 hour supervision. The patient's family states that they wish to take her home, however they will not be able to do so safely until her home nursing support will be available on Monday.  History of CVA:     - ASA 81mg , atorvastatin 80mg   History of hypertension     - lopressor 50 mg BID     - PRN hydralazine  Hypothyroidism     - continue synthroid  Prolonged QT     - watch Mg2+, K+, avoid QT prolonging agents.  DVT prophylaxis: SCDs Code Status: DNR   Disposition Plan: TBD  Consultants:   GI  ROS:  Denies CP, ab pain, N, V . Remainder 10-pt ROS is negative for all not previously mentioned.  Subjective: "Ok."  Objective: Vitals:   07/01/19 0647 07/01/19 0954 07/01/19 1056 07/01/19 1106  BP: Marland Kitchen)  171/51 (!) 197/58 (!) 181/43 (!) 165/51  Pulse: 64 (!) 57 64 68  Resp: 19 15 17 16   Temp: 98.6 F (37 C) 97.7 F (36.5 C) 97.9 F (36.6 C)   TempSrc: Oral Temporal Temporal   SpO2: 100% 98% 96% 96%  Weight: 54.7 kg     Height:        Intake/Output Summary (Last 24 hours) at 07/01/2019 1433 Last data filed at 07/01/2019 1055 Gross per 24 hour  Intake 490 ml  Output 0 ml  Net 490 ml   Filed Weights   06/29/19 0545 06/30/19 0645 07/01/19 0647  Weight: 55.7 kg 55.2 kg 54.7 kg    Examination:  General: 85 y.o. female resting in bed in NAD Cardiovascular: RRR, +S1, S2, 1/6 SEM equal pulses throughout Respiratory: CTABL, no w/r/r, normal WOB GI: BS+, NDNT,  no masses noted, no organomegaly noted MSK: No e/c/c Neuro: alert to name, follows commands Psyc: calm/cooperative   Data Reviewed: I have personally reviewed following labs and imaging studies.  CBC: Recent Labs  Lab 06/25/19 0715 06/27/19 0428 06/28/19 1720 06/29/19 [id number] 06/29/19 0947 06/30/19 0418 07/01/19 1236  WBC 4.8 3.8*  --   --   --  4.0 4.4  NEUTROABS 3.1 1.7  --   --   --  1.9 2.7  HGB 10.1* 9.5* 10.2* 9.5* 9.1* 9.6* 10.9*  HCT 31.0* 29.4* 32.0* 30.2* 28.4* 29.7* 33.9*  MCV 92.8 93.0  --   --   --  92.5 91.9  PLT 146* 145*  --   --   --  155 0000000   Basic Metabolic Panel: Recent Labs  Lab 06/25/19 0715 06/27/19 0428 06/29/19 0611 06/30/19 0418 07/01/19 1236  NA 143 145 143 141 142  K 3.9 3.8 3.5 3.3* 4.0  CL 108 110 110 106 105  CO2 25 27 25 24 26   GLUCOSE 96 97 96 77 100*  BUN 11 12 10 9 10   CREATININE 1.02* 1.02* 0.96 0.96 1.10*  CALCIUM 8.9 8.8* 8.7* 8.7* 8.8*   GFR: Estimated Creatinine Clearance: 25.3 mL/min (A) (by C-G formula based on SCr of 1.1 mg/dL (H)). Liver Function Tests: No results for input(s): AST, ALT, ALKPHOS, BILITOT, PROT, ALBUMIN in the last 168 hours. No results for input(s): LIPASE, AMYLASE in the last 168 hours. No results for input(s): AMMONIA in the last 168 hours. Coagulation Profile: No results for input(s): INR, PROTIME in the last 168 hours. Cardiac Enzymes: No results for input(s): CKTOTAL, CKMB, CKMBINDEX, TROPONINI in the last 168 hours. BNP (last 3 results) No results for input(s): PROBNP in the last 8760 hours. HbA1C: No results for input(s): HGBA1C in the last 72 hours. CBG: Recent Labs  Lab 06/29/19 1202 06/29/19 2323 06/30/19 0636 06/30/19 1322 06/30/19 1737  GLUCAP 83 82 72 114* 88   Lipid Profile: No results for input(s): CHOL, HDL, LDLCALC, TRIG, CHOLHDL, LDLDIRECT in the last 72 hours. Thyroid Function Tests: No results for input(s): TSH, T4TOTAL, FREET4, T3FREE, THYROIDAB in the last 72 hours.  Anemia Panel: No results for input(s): VITAMINB12, FOLATE, FERRITIN, TIBC, IRON, RETICCTPCT in the last 72 hours. Sepsis Labs: No results for input(s): PROCALCITON, LATICACIDVEN in the last 168 hours.  No results found for this or any previous visit (from the past 240 hour(s)).    Radiology Studies: CT Angio Abd/Pel w/ and/or w/o  Result Date: 06/30/2019 CLINICAL DATA:  GI bleed. Gastric ulcer. On anticoagulation due to previous stroke. EXAM: CTA ABDOMEN AND PELVIS WITHOUT AND WITH  CONTRAST TECHNIQUE: Multidetector CT imaging of the abdomen and pelvis was performed using the standard protocol during bolus administration of intravenous contrast. Multiplanar reconstructed images and MIPs were obtained and reviewed to evaluate the vascular anatomy. CONTRAST:  162mL OMNIPAQUE IOHEXOL 350 MG/ML SOLN COMPARISON:  08/11/2008 FINDINGS: VASCULAR Coronary calcifications.  Heavy aortic valve leaflet calcifications. Aorta: Extensive calcified atheromatous plaque. No aneurysm, dissection, or stenosis. Celiac: Calcified ostial plaque resulting in only mild stenosis. Scattered calcified plaque distally. SMA: Calcified plaque extending from the ostium over length of approximately 1.5 cm without high-grade stenosis, patent distally with classic distal branch anatomy. Renals: Single left, with eccentric partially calcified ostial plaque resulting in mild short-segment stenosis, patent distally. Single right, with partially calcified ostial plaque resulting in short segment stenosis of mild severity, patent distally. IMA: Patent without evidence of aneurysm, dissection, vasculitis or significant stenosis. Inflow: Scattered calcified nonocclusive plaque. No aneurysm, dissection, or stenosis. Proximal Outflow: Bilateral common femoral and visualized portions of the superficial and profunda femoral arteries are patent without evidence of aneurysm, dissection, vasculitis or significant stenosis. Veins: Patent hepatic veins,  portal vein, SM V, IM V, splenic vein, bilateral renal veins, IVC. No venous pathology identified. Review of the MIP images confirms the above findings. NON-VASCULAR Lower chest: Trace left pleural effusion. Minimal atelectasis posteriorly at the left lung base. Hepatobiliary: No focal liver abnormality is seen. No gallstones, gallbladder wall thickening, or biliary dilatation. Pancreas: Mild diffuse atrophy without mass or ductal dilatation. Spleen: Normal size.  Scattered calcified granulomas. Adrenals/Urinary Tract: Normal adrenal glands. Left nephrolithiasis, largest stone or cluster 1.4 cm in the lower pole. Normal renal parenchymal enhancement without mass. No hydronephrosis or ureterectasis. Urinary bladder nondistended. Stomach/Bowel: No active extravasation evident. The stomach is nondistended. Small bowel is nondilated. Appendix not identified. The colon is incompletely distended, with scattered diverticula in the sigmoid segment without adjacent inflammatory/edematous change or abscess. Lymphatic: Prominent aortocaval lymph nodes up to 8 mm short axis diameter. No mesenteric or pelvic adenopathy. Reproductive: Uterus and bilateral adnexa are unremarkable. Other: Trace free pelvic fluid.  No abdominal ascites.  No free air. Musculoskeletal: Multilevel thoracolumbar spondylitic change. Bilateral hip DJD. No fracture or worrisome bone lesion. IMPRESSION: 1. No active extravasation or other acute  findings. 2. Trace left pleural effusion. 3. Sigmoid diverticulosis 4. Left nephrolithiasis Electronically Signed   By: Lucrezia Europe M.D.   On: 06/30/2019 08:05     Scheduled Meds: . sodium chloride   Intravenous Once  . sodium chloride   Intravenous Once  . aspirin EC  81 mg Oral Daily  . atorvastatin  80 mg Oral q1800  . donepezil  5 mg Oral QHS  . escitalopram  5 mg Oral QHS  . levothyroxine  88 mcg Oral Q0600  . metoprolol tartrate  50 mg Oral BID  . pantoprazole  40 mg Oral BID  . potassium chloride   40 mEq Oral Once   Continuous Infusions:   LOS: 13 days    Time spent: 25 minutes spent in the coordination of care today.    Jonnie Finner, DO [facility] Pager [phone]  If 7PM-7AM, please contact night-coverage [email or web address] Password [facility] 07/01/2019, 2:33 PM

## 2019-07-02 LAB — RENAL FUNCTION PANEL
Albumin: 2.9 g/dL — ABNORMAL LOW (ref 3.5–5.0)
Anion gap: 11 (ref 5–15)
BUN: 11 mg/dL (ref 8–23)
CO2: 25 mmol/L (ref 22–32)
Calcium: 8.9 mg/dL (ref 8.9–10.3)
Chloride: 106 mmol/L (ref 98–111)
Creatinine, Ser: 0.95 mg/dL (ref 0.44–1.00)
GFR calc Af Amer: 59 mL/min — ABNORMAL LOW (ref >60)
GFR calc non Af Amer: 51 mL/min — ABNORMAL LOW (ref >60)
Glucose, Bld: 97 mg/dL (ref 70–99)
Phosphorus: 3.3 mg/dL (ref 2.5–4.6)
Potassium: 3.5 mmol/L (ref 3.5–5.1)
Sodium: 142 mmol/L (ref 135–145)

## 2019-07-02 LAB — GLUCOSE, CAPILLARY
Glucose-Capillary: 103 mg/dL — ABNORMAL HIGH (ref 70–99)
Glucose-Capillary: 124 mg/dL — ABNORMAL HIGH (ref 70–99)

## 2019-07-02 LAB — CBC WITH DIFFERENTIAL/PLATELET
Abs Immature Granulocytes: 0.02 10*3/uL (ref 0.00–0.07)
Basophils Absolute: 0 10*3/uL (ref 0.0–0.1)
Basophils Relative: 1 %
Eosinophils Absolute: 0.2 10*3/uL (ref 0.0–0.5)
Eosinophils Relative: 2 %
HCT: 30.9 % — ABNORMAL LOW (ref 36.0–46.0)
Hemoglobin: 9.9 g/dL — ABNORMAL LOW (ref 12.0–15.0)
Immature Granulocytes: 0 %
Lymphocytes Relative: 20 %
Lymphs Abs: 1.7 10*3/uL (ref 0.7–4.0)
MCH: 29.8 pg (ref 26.0–34.0)
MCHC: 32 g/dL (ref 30.0–36.0)
MCV: 93.1 fL (ref 80.0–100.0)
Monocytes Absolute: 0.7 10*3/uL (ref 0.1–1.0)
Monocytes Relative: 9 %
Neutro Abs: 5.6 10*3/uL (ref 1.7–7.7)
Neutrophils Relative %: 68 %
Platelets: 212 10*3/uL (ref 150–400)
RBC: 3.32 MIL/uL — ABNORMAL LOW (ref 3.87–5.11)
RDW: 14.6 % (ref 11.5–15.5)
WBC: 8.2 10*3/uL (ref 4.0–10.5)
nRBC: 0 % (ref 0.0–0.2)

## 2019-07-02 LAB — MAGNESIUM: Magnesium: 1.7 mg/dL (ref 1.7–2.4)

## 2019-07-02 LAB — SURGICAL PATHOLOGY

## 2019-07-02 MED ORDER — ASPIRIN 81 MG PO TBEC: 81.0000 mg | DELAYED_RELEASE_TABLET | Freq: Every day | ORAL | 0 refills | Status: AC

## 2019-07-02 MED ORDER — MAGNESIUM OXIDE 400 (241.3 MG) MG PO TABS: 400.0000 mg | ORAL_TABLET | Freq: Two times a day (BID) | ORAL | 0 refills | Status: AC

## 2019-07-02 MED ORDER — POTASSIUM CHLORIDE 20 MEQ PO PACK: 40.0000 meq | PACK | Freq: Every day | ORAL | 0 refills | Status: DC

## 2019-07-02 MED ORDER — PANTOPRAZOLE SODIUM 40 MG PO TBEC: 40.0000 mg | DELAYED_RELEASE_TABLET | Freq: Two times a day (BID) | ORAL | 2 refills | Status: AC

## 2019-07-02 MED ORDER — MAGNESIUM OXIDE 400 (241.3 MG) MG PO TABS
400.0000 mg | ORAL_TABLET | Freq: Two times a day (BID) | ORAL | Status: DC
  Administered 2019-07-02 – 2019-07-03 (×3): 400 mg via ORAL
  Filled 2019-07-02 (×4): qty 1

## 2019-07-02 NOTE — Progress Notes (Signed)
[name]    NARI BROEKER  [id number] DOB: [birth year] DOA: 06/18/2019 PCP: [name], NP   Brief Narrative:   85 y.o.femalewithhistory of recent stroke on antiplatelet agents, hypertension, dementia, CAD, hypothyroidism was brought to the ER patient's son found that patient has been throwing up blood associated with some nausea and burping noted by family members for 2 days prior to admission. On the evening of presentation, when she went to the bathroom she was found to be having hematemesis and almost fell onto the floor but patient's son caught her. Did not lose consciousness. Following this episode, patient had another episode of throwing up blood with reported black stools. EMS was called and patient was found to be hypotensive  07/02/19: Denies complaints today. S/p EGD. To home in AM   Assessment & Plan:   Principal Problem:   Acute GI bleeding Active Problems:   Hypothyroidism   Essential hypertension   Cognitive impairment   CVA (cerebral vascular accident) ([place])   Acute blood loss anemia   Acute gastric ulcer with hemorrhage   Gastric ulcer without hemorrhage or perforation  Upper GI bleed     - Patient had hematemesis/melena on presentation with acute blood loss anemia.      - EGD x2 revealed nonbleeding gastric ulcer likely related to aspirin use.      - Required 2 units of PRBC in this hospitalization.      - Patient had CVA last month with ASA increasefrom81 mg to 325 mg at that time as well as addition to Plavix.      - GI would like to avoice c-scope     - 12/23 EGD: One non-bleeding deep, cratered 15 mm gastric ulcer with no stigmata of bleeding was found in the prepyloric region of the stomach with surrounding erythema. Biopsies were taken with a cold forceps for histology. Estimated blood loss: none. Pylorus was slightly deformed. No outlet obstruction.     - previous physician discussed the patient with Dr. Rodney Booze. He recommends  discontinuation of plavix and to switch to low dose asa     - 12/24: Hgb stable, no evidence of bleed; continue protonix BID x 12 weeks, then qday after; follow up with GI outpt  Dementia/delirium:      - In the setting of acute blood loss anemia, recent stroke and hospitalization.      - Continue sitter and delirium precautions.      - Avoid benzodiazepines in this elderly patient.     - Can add melatonin if available through pharmacywill resume home dose of Lexapro/Aricept.     - According to son patient only has memory issues at baseline.      - PT worked with the patient today and has recommended home with HHPT and 24 hour supervision. The patient's family states that they wish to take her home, will be able to take in the morning  History of CVA:     - ASA 81mg , atorvastatin 80mg   History of hypertension     - lopressor 50 mg BID     - PRN hydralazine  Hypothyroidism     - continue synthroid  Prolonged QT     - Mg2+/K+ ok, avoid QT prolonging agents.  DVT prophylaxis: SCDs Code Status: DNR   Disposition Plan: To Home in AM  Consultants:   GI  ROS:  Denies ab pain, dyspnea, N, V . Remainder 10-pt ROS is negative for all not previously mentioned.  Subjective: "  Oh, I think it's fine."  Objective: Vitals:   07/01/19 1106 07/01/19 1456 07/01/19 2047 07/01/19 2307  BP: (!) 165/51 (!) 160/67 (!) 137/47 (!) 139/92  Pulse: 68 82 88   Resp: 16  18   Temp:  98.1 F (36.7 C) 98.7 F (37.1 C)   TempSrc:  Oral Oral   SpO2: 96% 98% 100%   Weight:      Height:        Intake/Output Summary (Last 24 hours) at 07/02/2019 1437 Last data filed at 07/02/2019 0930 Gross per 24 hour  Intake 340 ml  Output --  Net 340 ml   Filed Weights   06/29/19 0545 06/30/19 0645 07/01/19 0647  Weight: 55.7 kg 55.2 kg 54.7 kg    Examination:  General: 85 y.o. female resting in chair in NAD Cardiovascular: RRR, +S1, S2, no m/g/r Respiratory: CTABL, no w/r/r GI: BS+,  NDNT, no masses noted MSK: No e/c/c Skin: No rashes, bruises, ulcerations noted Neuro: Alert to name, follows commands Psyc: calm/cooperative   Data Reviewed: I have personally reviewed following labs and imaging studies.  CBC: Recent Labs  Lab 06/27/19 0428 06/29/19 0611 06/29/19 0947 06/30/19 0418 07/01/19 1236 07/02/19 0403  WBC 3.8*  --   --  4.0 4.4 8.2  NEUTROABS 1.7  --   --  1.9 2.7 5.6  HGB 9.5* 9.5* 9.1* 9.6* 10.9* 9.9*  HCT 29.4* 30.2* 28.4* 29.7* 33.9* 30.9*  MCV 93.0  --   --  92.5 91.9 93.1  PLT 145*  --   --  155 195 [id number]   Basic Metabolic Panel: Recent Labs  Lab 06/27/19 0428 06/29/19 0611 06/30/19 0418 07/01/19 1236 07/02/19 0403  NA 145 143 141 142 142  K 3.8 3.5 3.3* 4.0 3.5  CL 110 110 106 105 106  CO2 27 25 24 26 25   GLUCOSE 97 96 77 100* 97  BUN 12 10 9 10 11   CREATININE 1.02* 0.96 0.96 1.10* 0.95  CALCIUM 8.8* 8.7* 8.7* 8.8* 8.9  MG  --   --   --   --  1.7  PHOS  --   --   --   --  3.3   GFR: Estimated Creatinine Clearance: 29.3 mL/min (by C-G formula based on SCr of 0.95 mg/dL). Liver Function Tests: Recent Labs  Lab 07/02/19 0403  ALBUMIN 2.9*   No results for input(s): LIPASE, AMYLASE in the last 168 hours. No results for input(s): AMMONIA in the last 168 hours. Coagulation Profile: No results for input(s): INR, PROTIME in the last 168 hours. Cardiac Enzymes: No results for input(s): CKTOTAL, CKMB, CKMBINDEX, TROPONINI in the last 168 hours. BNP (last 3 results) No results for input(s): PROBNP in the last 8760 hours. HbA1C: No results for input(s): HGBA1C in the last 72 hours. CBG: Recent Labs  Lab 06/30/19 0636 06/30/19 1322 06/30/19 1737 07/02/19 0003 07/02/19 1256  GLUCAP 72 114* 88 103* 124*   Lipid Profile: No results for input(s): CHOL, HDL, LDLCALC, TRIG, CHOLHDL, LDLDIRECT in the last 72 hours. Thyroid Function Tests: No results for input(s): TSH, T4TOTAL, FREET4, T3FREE, THYROIDAB in the last 72  hours. Anemia Panel: No results for input(s): VITAMINB12, FOLATE, FERRITIN, TIBC, IRON, RETICCTPCT in the last 72 hours. Sepsis Labs: No results for input(s): PROCALCITON, LATICACIDVEN in the last 168 hours.  No results found for this or any previous visit (from the past 240 hour(s)).    Radiology Studies: No results found.   Scheduled Meds: . sodium chloride  Intravenous Once  . sodium chloride   Intravenous Once  . aspirin EC  81 mg Oral Daily  . atorvastatin  80 mg Oral q1800  . donepezil  5 mg Oral QHS  . escitalopram  5 mg Oral QHS  . levothyroxine  88 mcg Oral Q0600  . metoprolol tartrate  50 mg Oral BID  . pantoprazole  40 mg Oral BID  . potassium chloride  40 mEq Oral Once   Continuous Infusions:   LOS: 14 days    Time spent: 25 minutes spent in the coordination of care today.    Jonnie Finner, DO [facility]  If 7PM-7AM, please contact night-coverage [email or web address] 07/02/2019, 2:37 PM

## 2019-07-02 NOTE — Progress Notes (Addendum)
          Daily Rounding Note  07/02/2019, 10:58 AM  LOS: 14 days   SUBJECTIVE:   Chief complaint: UGI bleed.  melena  No BM's.  Pt has no complaints  OBJECTIVE:         Vital signs in last 24 hours:    Temp:  [98.1 F (36.7 C)-98.7 F (37.1 C)] 98.7 F (37.1 C) (12/23 2047) Pulse Rate:  [68-88] 88 (12/23 2047) Resp:  [16-18] 18 (12/23 2047) BP: (137-165)/(47-92) 139/92 (12/23 2307) SpO2:  [96 %-100 %] 100 % (12/23 2047) Last BM Date: 06/30/19 Filed Weights   06/29/19 0545 06/30/19 0645 07/01/19 0647  Weight: 55.7 kg 55.2 kg 54.7 kg   General: aged and a bit frail, not ill looking   Heart: RRR Chest: clear bil.  No dyspnea Abdomen: NT, ND, soft.  Active BS  Extremities: no CCE Neuro/Psych:  Oriented to self, not place  Intake/Output from previous day: 12/23 0701 - 12/24 0700 In: 450 [P.O.:200; I.V.:250] Out: 0   Intake/Output this shift: No intake/output data recorded.  Lab Results: Recent Labs    06/30/19 0418 07/01/19 1236 07/02/19 0403  WBC 4.0 4.4 8.2  HGB 9.6* 10.9* 9.9*  HCT 29.7* 33.9* 30.9*  PLT 155 195 212   BMET Recent Labs    06/30/19 0418 07/01/19 1236 07/02/19 0403  NA 141 142 142  K 3.3* 4.0 3.5  CL 106 105 106  CO2 24 26 25   GLUCOSE 77 100* 97  BUN 9 10 11   CREATININE 0.96 1.10* 0.95  CALCIUM 8.7* 8.8* 8.9   LFT Recent Labs    07/02/19 0403  ALBUMIN 2.9*     ASSESMENT:   *    Melena. Blood loss anemia.  S/p 3 PRBC, last on 12/13.   06/19/2019 EGD #1.  1.5 cm gastric ulcer, not bleeding.  Gastritis.  2 cm HH. 06/21/2019 EGD # 2.  No bleeding.  HH.  Non bleeding GU, no bleeding stigmata. 07/01/2019 EGD # 3.    Nonbleeding deep, 15 mm GU, no stigmata or active bleeding.  Biopsies obtained.  2 cm hiatal hernia. Hgb stable over 48 hours but drop 1 gm in last 24 hours.    *     Chronic Plavix started after stroke early 05/2019.  *    Advanced dementia.      PLAN   *     Protonix 40 po bid for 12 weeks, then drop to 1 x daily. Repeat EGD in 12 weeks to assure healing of ulcer.  *     Recommend not restarting Plavix and in its place restart ASA 81 mg.  Strategy/plan discussed with the patient's son as d/c Plavix increases risk of stroke but there is imminent risk of rebleeding if Plavix resumed.  Son understands risks and agrees with the plan.  Perhaps after repeat EGD Plavix could be resumed.  *   Has GI follow-up with Chester Holstein NP on 07/22/2019.    Azucena Freed  07/02/2019, 10:58 AM Phone [phone]    Attending physician's note   I have taken an interval history, reviewed the chart and examined the patient. I agree with the Advanced Practitioner's note, impression and recommendations.   No further bleeding. Recommendations as above. I have discussed with patient's son in detail yesterday.  He understands and agrees with the plan. We will sign off for now.  Carmell Austria, MD Vel[facility] Heckler Fabienne Bruns [phone].

## 2019-07-02 NOTE — TOC Transition Note (Signed)
Transition of Care [facility]) - CM/SW Discharge Note   Patient Details  Name: Maria Clarke MRN: [id number] Date of Birth: 10-21-1921  [facility] CM/SW Contact:  [name], RN Phone Number: 07/02/2019, 2:37 PM   Clinical Narrative: Patient will transition home 07-03-19. Case Manager did call son Maria Clarke and he will provide transportation home. Case Manager did make him aware that patient will be ready by 10:00 am for transport home. Physician and charge nurse are aware of plan of care. Physician to place Home health Physical Therapy orders. [facility] aware of transition home Friday. No further needs from Case Manager at this time.       Final next level of care: [place] Barriers to Discharge: Continued Medical Work up(MD- wants to have [place] meeting with family)   Patient Goals and CMS Choice Patient states their goals for this hospitalization and ongoing recovery are:: "to return home" CMS Medicare.gov Compare Post Acute Care list provided to:: Patient Represenative (must comment)(Son- Maria Clarke) Choice offered to / list presented to : Adult Children  Discharge Plan and Services   Discharge Planning Services: CM Consult Post Acute Care Choice: Home Health          HH Arranged: PT Price Agency: [place] ([facility]) Date [place]: 06/25/19 Time [place]: 1627 Representative spoke with at [place]: [place] Flow-  Social Determinants of Health ([place]) Interventions     Readmission Risk Interventions No flowsheet data found.

## 2019-07-03 DIAGNOSIS — F0390 Unspecified dementia without behavioral disturbance: Secondary | ICD-10-CM

## 2019-07-03 LAB — GLUCOSE, CAPILLARY
Glucose-Capillary: 116 mg/dL — ABNORMAL HIGH (ref 70–99)
Glucose-Capillary: 90 mg/dL (ref 70–99)

## 2019-07-03 NOTE — Discharge Summary (Signed)
. Physician Discharge Summary  ICA HOLFORD Maria Clarke DOB: [birth year] DOA: 06/18/2019  PCP: [name], NP  Admit date: 06/18/2019 Discharge date: 07/03/2019  Admitted From: Home Disposition:  Discharged to home with [facility]  Recommendations for Outpatient Follow-up:  1. Follow up with PCP in 1-2 weeks 2. Please obtain BMP/CBC in one week 3. Follow up biopsy results with GI (07/22/19)  Discharge Condition: Stable  CODE STATUS: DNR   Brief/Interim Summary: 85 y.o.femalewithhistory of recent stroke on antiplatelet agents, hypertension, dementia, CAD, hypothyroidism was brought to the ER patient's son found that patient has been throwing up blood associated with some nausea and burping noted by family members for 2 days prior to admission. On the evening of presentation, when she went to the bathroom she was found to be having hematemesis and almost fell onto the floor but patient's son caught her. Did not lose consciousness. Following this episode, patient had another episode of throwing up blood with reported black stools. EMS was called and patient was found to be hypotensive  07/02/19: No acute events ON. Stable for discharged. Follow up with PCP in 1 week and GI as scheduled.   Discharge Diagnoses:  Principal Problem:   Acute GI bleeding Active Problems:   Hypothyroidism   Essential hypertension   Cognitive impairment   CVA (cerebral vascular accident) ([place])   Acute blood loss anemia   Acute gastric ulcer with hemorrhage   Gastric ulcer without hemorrhage or perforation  Upper GI bleed - Patient had hematemesis/melena on presentation with acute blood loss anemia.  - EGD x2 revealed nonbleeding gastric ulcer likely related to aspirin use.  - Required 2 units of PRBC in this hospitalization.  - Patient had CVA last month with ASA increasefrom81 mg to 325 mg at that time as well as addition to Plavix.  - GI would like to avoice  c-scope - 12/23 EGD: One non-bleeding deep, cratered 15 mm gastric ulcer with no stigmata of bleeding was found in the prepyloric region of the stomach with surrounding erythema. Biopsies were taken with a cold forceps for histology. Estimated blood loss: none. Pylorus was slightly deformed. No outlet obstruction. - previous physician discussed the patient with Dr. Rodney Booze. He recommends discontinuation of plavix and to switch to low dose asa     - 12/24: Hgb stable, no evidence of bleed; continue protonix BID x 12 weeks, then qday after; follow up with GI outpt     - 07/03/19: stable for discharge     - Bx result: FINAL MICROSCOPIC DIAGNOSIS:              A. STOMACH, ULCER, BIOPSY:              - Mild chronic gastritis with reactive changes              - No H. pylori or intestinal metaplasia identified   Dementia/delirium:  - In the setting of acute blood loss anemia, recent stroke and hospitalization.  - Continue sitter and delirium precautions.  - Avoid benzodiazepines in this elderly patient. - Can add melatonin if available through pharmacywill resume home dose of Lexapro/Aricept. - According to son patient only has memory issues at baseline.  - PT worked with the patient today and has recommended home with HHPT and 24 hour supervision. The patient's family states that they wish to take her home, will be able to take in the morning     - 07/03/19: Stable for discharge  History of CVA: -  ASA 81mg , atorvastatin 80mg   History of hypertension - lopressor 50 mg BID - PRN hydralazine  Hypothyroidism - continue synthroid  Prolonged QT - Mg2+/K+ ok, avoid QT prolonging agents.  Discharge Instructions   Allergies as of 07/03/2019      Reactions   Sulfonamide Derivatives Other (See Comments)   "ORAL S.E." (family unaware what time means)   Mirtazapine Other (See Comments)   Caused much agitation, so it was  STOPPED by family      Medication List    STOP taking these medications   aspirin 325 MG tablet Replaced by: aspirin 81 MG EC tablet   clopidogrel 75 MG tablet Commonly known as: PLAVIX     TAKE these medications   aspirin 81 MG EC tablet Take 1 tablet (81 mg total) by mouth daily. Replaces: aspirin 325 MG tablet   atorvastatin 80 MG tablet Commonly known as: LIPITOR Take 1 tablet (80 mg total) by mouth daily at 6 PM.   cyanocobalamin 1000 MCG/ML injection Commonly known as: (VITAMIN B-12) Inject 1 mL (1,000 mcg total) into the skin every 30 (thirty) days.   donepezil 5 MG tablet Commonly known as: ARICEPT Take 1 tablet (5 mg total) by mouth at bedtime.   escitalopram 5 MG tablet Commonly known as: LEXAPRO Take 1 tablet (5 mg total) by mouth at bedtime.   levothyroxine 100 MCG tablet Commonly known as: SYNTHROID Take 100 mcg by mouth daily before breakfast. What changed: Another medication with the same name was removed. Continue taking this medication, and follow the directions you see here.   magnesium oxide 400 (241.3 Mg) MG tablet Commonly known as: MAG-OX Take 1 tablet (400 mg total) by mouth 2 (two) times daily for 7 days.   metoprolol tartrate 50 MG tablet Commonly known as: LOPRESSOR TAKE 1 TABLET BY MOUTH TWICE A DAY What changed:   how much to take  how to take this  when to take this   pantoprazole 40 MG tablet Commonly known as: PROTONIX Take 1 tablet (40 mg total) by mouth 2 (two) times daily.   potassium chloride 20 MEQ packet Commonly known as: KLOR-CON Take 40 mEq by mouth daily for 7 doses.      Follow-up Information    [facility] Follow [facility].   Specialty: Home Health Services Why: Physical Therapy-office to call you with visit times. If you have questions please call [phone]         Allergies  Allergen Reactions  . Sulfonamide Derivatives Other (See Comments)    "ORAL S.E." (family unaware what time  means)  . Mirtazapine Other (See Comments)    Caused much agitation, so it was STOPPED by family    Consultations:  GI   Procedures/Studies: CT Head Wo Contrast  Result Date: 06/18/2019 CLINICAL DATA:  Mental status. Headache. Recent stroke. Fall. EXAM: CT HEAD WITHOUT CONTRAST TECHNIQUE: Contiguous axial images were obtained from the base of the skull through the vertex without intravenous contrast. COMPARISON:  Head CT and brain MRI 05/14/2019 FINDINGS: Brain: Left occipital infarct that was late subacute appearing on imaging last month has undergone expected evolution with developing encephalomalacia. No hemorrhagic transformation. No acute hemorrhage. No evidence of new ischemia. Stable degree of generalized atrophy and moderate to advanced chronic small vessel ischemia. Subdural or extra-axial collection. No midline shift, mass effect, or hydrocephalus. Vascular: Atherosclerosis of skullbase vasculature without hyperdense vessel or abnormal calcification. Skull: No fracture or focal lesion. Sinuses/Orbits: Paranasal sinuses and mastoid air cells  are clear. The visualized orbits are unremarkable. Bilateral lens extraction. Other: None. IMPRESSION: 1. Expected evolution of left occipital infarct with developing encephalomalacia since imaging last month. 2. No new abnormality. Stable atrophy and moderate to advanced chronic small vessel ischemia. Electronically Signed   By: Keith Rake M.D.   On: 06/18/2019 22:08   CT Angio Abd/Pel w/ and/or w/o  Result Date: 06/30/2019 CLINICAL DATA:  GI bleed. Gastric ulcer. On anticoagulation due to previous stroke. EXAM: CTA ABDOMEN AND PELVIS WITHOUT AND WITH CONTRAST TECHNIQUE: Multidetector CT imaging of the abdomen and pelvis was performed using the standard protocol during bolus administration of intravenous contrast. Multiplanar reconstructed images and MIPs were obtained and reviewed to evaluate the vascular anatomy. CONTRAST:  128mL OMNIPAQUE  IOHEXOL 350 MG/ML SOLN COMPARISON:  08/11/2008 FINDINGS: VASCULAR Coronary calcifications.  Heavy aortic valve leaflet calcifications. Aorta: Extensive calcified atheromatous plaque. No aneurysm, dissection, or stenosis. Celiac: Calcified ostial plaque resulting in only mild stenosis. Scattered calcified plaque distally. SMA: Calcified plaque extending from the ostium over length of approximately 1.5 cm without high-grade stenosis, patent distally with classic distal branch anatomy. Renals: Single left, with eccentric partially calcified ostial plaque resulting in mild short-segment stenosis, patent distally. Single right, with partially calcified ostial plaque resulting in short segment stenosis of mild severity, patent distally. IMA: Patent without evidence of aneurysm, dissection, vasculitis or significant stenosis. Inflow: Scattered calcified nonocclusive plaque. No aneurysm, dissection, or stenosis. Proximal Outflow: Bilateral common femoral and visualized portions of the superficial and profunda femoral arteries are patent without evidence of aneurysm, dissection, vasculitis or significant stenosis. Veins: Patent hepatic veins, portal vein, SM V, IM V, splenic vein, bilateral renal veins, IVC. No venous pathology identified. Review of the MIP images confirms the above findings. NON-VASCULAR Lower chest: Trace left pleural effusion. Minimal atelectasis posteriorly at the left lung base. Hepatobiliary: No focal liver abnormality is seen. No gallstones, gallbladder wall thickening, or biliary dilatation. Pancreas: Mild diffuse atrophy without mass or ductal dilatation. Spleen: Normal size.  Scattered calcified granulomas. Adrenals/Urinary Tract: Normal adrenal glands. Left nephrolithiasis, largest stone or cluster 1.4 cm in the lower pole. Normal renal parenchymal enhancement without mass. No hydronephrosis or ureterectasis. Urinary bladder nondistended. Stomach/Bowel: No active extravasation evident. The stomach  is nondistended. Small bowel is nondilated. Appendix not identified. The colon is incompletely distended, with scattered diverticula in the sigmoid segment without adjacent inflammatory/edematous change or abscess. Lymphatic: Prominent aortocaval lymph nodes up to 8 mm short axis diameter. No mesenteric or pelvic adenopathy. Reproductive: Uterus and bilateral adnexa are unremarkable. Other: Trace free pelvic fluid.  No abdominal ascites.  No free air. Musculoskeletal: Multilevel thoracolumbar spondylitic change. Bilateral hip DJD. No fracture or worrisome bone lesion. IMPRESSION: 1. No active extravasation or other acute  findings. 2. Trace left pleural effusion. 3. Sigmoid diverticulosis 4. Left nephrolithiasis Electronically Signed   By: Lucrezia Europe M.D.   On: 06/30/2019 08:05      Subjective: "You know it, doll."  Discharge Exam: Vitals:   07/02/19 2039 07/02/19 2151  BP: (!) 150/55 (!) 154/57  Pulse: (!) 59 62  Resp: 16   Temp: 98.2 F (36.8 C)   SpO2: 98%    Vitals:   07/01/19 2307 07/02/19 1513 07/02/19 2039 07/02/19 2151  BP: (!) 139/92 (!) 145/54 (!) 150/55 (!) 154/57  Pulse:  61 (!) 59 62  Resp:  17 16   Temp:  98.6 F (37 C) 98.2 F (36.8 C)   TempSrc:  Oral Oral   SpO2:  99% 98%   Weight:      Height:        General: 85 y.o. female resting in chair eating breakfast in NAD Cardiovascular: RRR, +S1, S2, no m/g/r, equal pulses throughout Respiratory: CTABL, no w/r/r, normal WOB GI: BS+, NDNT, soft MSK: No e/c/c Neuro: alert to name, follows commands Psyc: Appropriate interaction and affect, calm/cooperative  The results of significant diagnostics from this hospitalization (including imaging, microbiology, ancillary and laboratory) are listed below for reference.     Microbiology: No results found for this or any previous visit (from the past 240 hour(s)).   Labs: BNP (last 3 results) No results for input(s): BNP in the last 8760 hours. Basic Metabolic  Panel: Recent Labs  Lab 06/27/19 0428 06/29/19 0611 06/30/19 0418 07/01/19 1236 07/02/19 0403  NA 145 143 141 142 142  K 3.8 3.5 3.3* 4.0 3.5  CL 110 110 106 105 106  CO2 27 25 24 26 25   GLUCOSE 97 96 77 100* 97  BUN 12 10 9 10 11   CREATININE 1.02* 0.96 0.96 1.10* 0.95  CALCIUM 8.8* 8.7* 8.7* 8.8* 8.9  MG  --   --   --   --  1.7  PHOS  --   --   --   --  3.3   Liver Function Tests: Recent Labs  Lab 07/02/19 0403  ALBUMIN 2.9*   No results for input(s): LIPASE, AMYLASE in the last 168 hours. No results for input(s): AMMONIA in the last 168 hours. CBC: Recent Labs  Lab 06/27/19 0428 06/29/19 0611 06/29/19 0947 06/30/19 0418 07/01/19 1236 07/02/19 0403  WBC 3.8*  --   --  4.0 4.4 8.2  NEUTROABS 1.7  --   --  1.9 2.7 5.6  HGB 9.5* 9.5* 9.1* 9.6* 10.9* 9.9*  HCT 29.4* 30.2* 28.4* 29.7* 33.9* 30.9*  MCV 93.0  --   --  92.5 91.9 93.1  PLT 145*  --   --  155 195 212   Cardiac Enzymes: No results for input(s): CKTOTAL, CKMB, CKMBINDEX, TROPONINI in the last 168 hours. BNP: Invalid input(s): POCBNP CBG: Recent Labs  Lab 06/30/19 1737 07/02/19 0003 07/02/19 1256 07/03/19 0011 07/03/19 0639  GLUCAP 88 103* 124* 116* 90   D-Dimer No results for input(s): DDIMER in the last 72 hours. Hgb A1c No results for input(s): HGBA1C in the last 72 hours. Lipid Profile No results for input(s): CHOL, HDL, LDLCALC, TRIG, CHOLHDL, LDLDIRECT in the last 72 hours. Thyroid function studies No results for input(s): TSH, T4TOTAL, T3FREE, THYROIDAB in the last 72 hours.  Invalid input(s): FREET3 Anemia work up No results for input(s): VITAMINB12, FOLATE, FERRITIN, TIBC, IRON, RETICCTPCT in the last 72 hours. Urinalysis    Component Value Date/Time   COLORURINE YELLOW 05/12/2019 1243   APPEARANCEUR CLEAR 05/12/2019 1243   LABSPEC 1.033 (H) 05/12/2019 1243   PHURINE 6.0 05/12/2019 1243   GLUCOSEU NEGATIVE 05/12/2019 1243   HGBUR SMALL (A) 05/12/2019 1243   HGBUR 1+  04/13/2010 0934   BILIRUBINUR NEGATIVE 05/12/2019 1243   BILIRUBINUR N 12/05/2016 1213   KETONESUR NEGATIVE 05/12/2019 1243   PROTEINUR NEGATIVE 05/12/2019 1243   UROBILINOGEN 0.2 12/05/2016 1213   UROBILINOGEN 0.2 04/13/2010 0934   NITRITE NEGATIVE 05/12/2019 1243   LEUKOCYTESUR NEGATIVE 05/12/2019 1243   Sepsis Labs Invalid input(s): PROCALCITONIN,  WBC,  LACTICIDVEN Microbiology No results found for this or any previous visit (from the past 240 hour(s)).   Time coordinating discharge: 35 minutes  SIGNED:  Jonnie Finner, DO  [facility] 07/03/2019, 8:36 AM   If 7PM-7AM, please contact night-coverage [email or web address]

## 2019-07-06 ENCOUNTER — Telehealth: Payer: Self-pay

## 2019-07-06 ENCOUNTER — Other Ambulatory Visit: Payer: Self-pay | Admitting: *Deleted

## 2019-07-06 DIAGNOSIS — I6523 Occlusion and stenosis of bilateral carotid arteries: Secondary | ICD-10-CM

## 2019-07-06 NOTE — Telephone Encounter (Signed)
Transition Care Management Follow-up Telephone Call  Date of discharge and from where: 07/03/2019 from [place]   How have you been since you were released from the hospital? "She's doing ok, she's having a pretty good day today".   Any questions or concerns? No   Items Reviewed:  Did the pt receive and understand the discharge instructions provided? Yes   Medications obtained and verified? Yes   Any new allergies since your discharge? No   Dietary orders reviewed? Yes  Do you have support at home? Yes ; has private pay caregivers  Other (ie: DME, Home Health, etc) yes, has PT from [facility].     Functional Questionnaire: (I = Independent and D = Dependent) ADL's: patient has round the clock care at home  Bathing/Dressing- requires assistance   Meal Prep- requires assistance  Eating- indpendent  Maintaining continence- requires assistance  Transferring/Ambulation- independent  Managing Meds- son's manage and put in pillbox   Follow up appointments reviewed:    PCP Hospital f/u appt confirmed? Yes  Scheduled to see Tommi Rumps 07/17/19 1:30pm telephone visit  [facility] f/u appt confirmed? Yes  Scheduled to see Dr. Chester Holstein 07/22/19 8:30am.  Are transportation arrangements needed? No   If their condition worsens, is the pt aware to call  their PCP or go to the ED? Yes  Was the patient provided with contact information for the PCP's office or ED? Yes  Was the pt encouraged to call back with questions or concerns? Yes

## 2019-07-07 DIAGNOSIS — Z7982 Long term (current) use of aspirin: Secondary | ICD-10-CM | POA: Diagnosis not present

## 2019-07-07 DIAGNOSIS — I2510 Atherosclerotic heart disease of native coronary artery without angina pectoris: Secondary | ICD-10-CM | POA: Diagnosis not present

## 2019-07-07 DIAGNOSIS — F329 Major depressive disorder, single episode, unspecified: Secondary | ICD-10-CM | POA: Diagnosis not present

## 2019-07-07 DIAGNOSIS — K259 Gastric ulcer, unspecified as acute or chronic, without hemorrhage or perforation: Secondary | ICD-10-CM | POA: Diagnosis not present

## 2019-07-07 DIAGNOSIS — I471 Supraventricular tachycardia: Secondary | ICD-10-CM | POA: Diagnosis not present

## 2019-07-07 DIAGNOSIS — D62 Acute posthemorrhagic anemia: Secondary | ICD-10-CM | POA: Diagnosis not present

## 2019-07-07 DIAGNOSIS — Z9181 History of falling: Secondary | ICD-10-CM | POA: Diagnosis not present

## 2019-07-07 DIAGNOSIS — F0280 Dementia in other diseases classified elsewhere without behavioral disturbance: Secondary | ICD-10-CM | POA: Diagnosis not present

## 2019-07-07 DIAGNOSIS — I10 Essential (primary) hypertension: Secondary | ICD-10-CM | POA: Diagnosis not present

## 2019-07-07 DIAGNOSIS — E7800 Pure hypercholesterolemia, unspecified: Secondary | ICD-10-CM | POA: Diagnosis not present

## 2019-07-07 DIAGNOSIS — E039 Hypothyroidism, unspecified: Secondary | ICD-10-CM | POA: Diagnosis not present

## 2019-07-07 DIAGNOSIS — Z8673 Personal history of transient ischemic attack (TIA), and cerebral infarction without residual deficits: Secondary | ICD-10-CM | POA: Diagnosis not present

## 2019-07-17 ENCOUNTER — Inpatient Hospital Stay: Payer: Self-pay | Admitting: Adult Health

## 2019-07-17 ENCOUNTER — Telehealth (INDEPENDENT_AMBULATORY_CARE_PROVIDER_SITE_OTHER): Payer: Medicare Other | Admitting: Adult Health

## 2019-07-17 ENCOUNTER — Encounter: Payer: Self-pay | Admitting: Adult Health

## 2019-07-17 ENCOUNTER — Telehealth: Payer: Self-pay | Admitting: Adult Health

## 2019-07-17 ENCOUNTER — Other Ambulatory Visit: Payer: Self-pay

## 2019-07-17 DIAGNOSIS — K259 Gastric ulcer, unspecified as acute or chronic, without hemorrhage or perforation: Secondary | ICD-10-CM | POA: Diagnosis not present

## 2019-07-17 DIAGNOSIS — I10 Essential (primary) hypertension: Secondary | ICD-10-CM | POA: Diagnosis not present

## 2019-07-17 DIAGNOSIS — R4189 Other symptoms and signs involving cognitive functions and awareness: Secondary | ICD-10-CM

## 2019-07-17 DIAGNOSIS — I63532 Cerebral infarction due to unspecified occlusion or stenosis of left posterior cerebral artery: Secondary | ICD-10-CM | POA: Diagnosis not present

## 2019-07-17 DIAGNOSIS — K922 Gastrointestinal hemorrhage, unspecified: Secondary | ICD-10-CM | POA: Diagnosis not present

## 2019-07-17 NOTE — Patient Instructions (Signed)
Health Maintenance Due  Topic Date Due  . DEXA SCAN  10/31/1988    Depression screen [facility] 2/9 06/09/2019 12/05/2016 05/25/2015  Decreased Interest 1 0 0  Down, Depressed, Hopeless 2 0 0  PHQ - 2 Score 3 0 0  Altered sleeping 3 - -  Tired, decreased energy 2 - -  Change in appetite 3 - -  Feeling bad or failure about yourself  2 - -  Trouble concentrating 3 - -  Moving slowly or fidgety/restless 2 - -  Suicidal thoughts 2 - -  PHQ-9 Score 20 - -  Difficult doing work/chores Very difficult - -

## 2019-07-17 NOTE — Progress Notes (Signed)
Virtual Visit via Telephone Note  I connected with Maria Clarke on 07/17/19 at  8:00 AM EST by telephone and verified that I am speaking with the correct person using two identifiers.   I discussed the limitations, risks, security and privacy concerns of performing an evaluation and management service by telephone and the availability of in person appointments. I also discussed with the patient that there may be a patient responsible charge related to this service. The patient expressed understanding and agreed to proceed.  Location patient: home Location provider: work or home office Participants present for the call: patient, provider, son Patient did not have a visit in the prior 7 days to address this/these issue(s).   History of Present Illness: TCM Visit  Admit Date:06/18/2019 Discharge Date:07/03/2019  She is a 86 year old female with history of recent stroke was placed on Plavix, hypertension, dementia, CAD, hypothyroidism.  She was brought to the emergency room after her son found that the patient had been throwing up blood associated with nausea and burping, noted by family members for 2 days prior to admission.  On the evening of presentation, when she went to the bathroom she was found to have hematemesis.  Following this episode, patient had another episode of throwing up blood with reported black stools.  EMS was called and patient was found to be hypotensive.  She had hematemesis/melena on presentation with acute blood loss anemia.  Her EGD revealed a nonbleeding gastric ulcer likely related to aspirin use.  She required 2 units of packed red blood cells during this hospitalization.  EEG on 1223 revealed a nonbleeding deep cratered 15 mm gastric ulcer with no stigmata of bleeding was found in the prepyloric region of the stomach with surrounding erythema.  Biopsies were taken with a cold forceps.  There was discontinuation of Plavix and she was switched to a low-dose aspirin.   She was advised to continue with Protonix twice daily x12 weeks and then daily after.  She was kept on Lipitor 80 mg at discharge.  Today her son reports that she is doing much better.  Has not had any additional GI bleeding since being discharged.  Her son denies any syncopal episodes, complaints of chest pain, shortness of breath, fevers, or chills.  Home health PT has been coming out to the house and they have found this useful.  She also has 24-hour home care and her sons and other family members keep a close eye on her as well.  Biopsy showed mild chronic gastritis with reactive changes.  No H. pylori or metaplasia was identified.   Observations/Objective: Patient sounds cheerful and well on the phone. I do not appreciate any SOB. Speech and thought processing are grossly intact. Patient reported vitals:  Assessment and Plan: 1. Acute GI bleeding -No acute bleeding since being discharged.  She is unable to come in for labs at this time.  Continue with aspirin 81 mg  2. Gastric ulcer without hemorrhage or perforation, unspecified chronicity -Follow-up with GI as directed  3. Cerebrovascular accident (CVA) due to occlusion of left posterior cerebral artery ([place]) -Patient has a hard time taking Lipitor in the evening, we can have them discontinue this.  At age 15 with dementia, this medication is likely to provide extended life expectancy   4. Essential hypertension -Continue Lopressor 50 mg twice daily.  She does have as needed hydralazine.  5. Cognitive impairment -Continue with Aricept 5 mg -We will place palliative care consult,as well as family would like  this   Follow Up Instructions:  I did not refer this patient for an OV in the next 24 hours for this/these issue(s).  I discussed the assessment and treatment plan with the patient. The patient was provided an opportunity to ask questions and all were answered. The patient agreed with the plan and demonstrated an  understanding of the instructions.   The patient was advised to call back or seek an in-person evaluation if the symptoms worsen or if the condition fails to improve as anticipated.  I provided 25 minutes of non-face-to-face time during this encounter.   [name], NP

## 2019-07-17 NOTE — Telephone Encounter (Signed)
error 

## 2019-07-22 ENCOUNTER — Ambulatory Visit: Payer: Medicare Other | Admitting: Gastroenterology

## 2019-07-22 ENCOUNTER — Ambulatory Visit: Payer: Medicare Other | Admitting: Nurse Practitioner

## 2019-07-31 ENCOUNTER — Other Ambulatory Visit: Payer: Self-pay | Admitting: Adult Health

## 2019-08-03 NOTE — Telephone Encounter (Signed)
Due to recent hospitalization with GI bleeding, it was recommended to discontinue Plavix and was started on aspirin 81 mg daily.

## 2019-08-06 DIAGNOSIS — F0280 Dementia in other diseases classified elsewhere without behavioral disturbance: Secondary | ICD-10-CM | POA: Diagnosis not present

## 2019-08-06 DIAGNOSIS — Z7982 Long term (current) use of aspirin: Secondary | ICD-10-CM | POA: Diagnosis not present

## 2019-08-06 DIAGNOSIS — I471 Supraventricular tachycardia: Secondary | ICD-10-CM | POA: Diagnosis not present

## 2019-08-06 DIAGNOSIS — F329 Major depressive disorder, single episode, unspecified: Secondary | ICD-10-CM | POA: Diagnosis not present

## 2019-08-06 DIAGNOSIS — Z9181 History of falling: Secondary | ICD-10-CM | POA: Diagnosis not present

## 2019-08-06 DIAGNOSIS — K259 Gastric ulcer, unspecified as acute or chronic, without hemorrhage or perforation: Secondary | ICD-10-CM | POA: Diagnosis not present

## 2019-08-06 DIAGNOSIS — Z8673 Personal history of transient ischemic attack (TIA), and cerebral infarction without residual deficits: Secondary | ICD-10-CM | POA: Diagnosis not present

## 2019-08-06 DIAGNOSIS — I2510 Atherosclerotic heart disease of native coronary artery without angina pectoris: Secondary | ICD-10-CM | POA: Diagnosis not present

## 2019-08-06 DIAGNOSIS — D62 Acute posthemorrhagic anemia: Secondary | ICD-10-CM | POA: Diagnosis not present

## 2019-08-06 DIAGNOSIS — I10 Essential (primary) hypertension: Secondary | ICD-10-CM | POA: Diagnosis not present

## 2019-08-06 DIAGNOSIS — E7800 Pure hypercholesterolemia, unspecified: Secondary | ICD-10-CM | POA: Diagnosis not present

## 2019-08-06 DIAGNOSIS — E039 Hypothyroidism, unspecified: Secondary | ICD-10-CM | POA: Diagnosis not present

## 2019-08-10 ENCOUNTER — Telehealth: Payer: Self-pay | Admitting: Adult Health

## 2019-08-10 NOTE — Telephone Encounter (Signed)
donepezil (ARICEPT) 5 MG tablet [id number] - [place], [place] - [address] AT [place] Phone:  [phone]  Fax:  [phone]     Pt son call and stated pt have only 5 tablets left and want a refill and a refill on all of the other

## 2019-08-11 ENCOUNTER — Other Ambulatory Visit: Payer: Self-pay

## 2019-08-11 DIAGNOSIS — K259 Gastric ulcer, unspecified as acute or chronic, without hemorrhage or perforation: Secondary | ICD-10-CM | POA: Diagnosis not present

## 2019-08-11 DIAGNOSIS — I10 Essential (primary) hypertension: Secondary | ICD-10-CM | POA: Diagnosis not present

## 2019-08-11 DIAGNOSIS — D62 Acute posthemorrhagic anemia: Secondary | ICD-10-CM | POA: Diagnosis not present

## 2019-08-11 DIAGNOSIS — I2510 Atherosclerotic heart disease of native coronary artery without angina pectoris: Secondary | ICD-10-CM | POA: Diagnosis not present

## 2019-08-11 DIAGNOSIS — E039 Hypothyroidism, unspecified: Secondary | ICD-10-CM | POA: Diagnosis not present

## 2019-08-11 DIAGNOSIS — F0280 Dementia in other diseases classified elsewhere without behavioral disturbance: Secondary | ICD-10-CM | POA: Diagnosis not present

## 2019-08-11 MED ORDER — DONEPEZIL HCL 5 MG PO TABS: 5.0000 mg | ORAL_TABLET | Freq: Every day | ORAL | 4 refills | Status: DC

## 2019-08-11 NOTE — Telephone Encounter (Signed)
Rx sent to pharmacy for a year supply per [facility].

## 2019-08-11 NOTE — Telephone Encounter (Signed)
[facility] for refill? Medication has not been filled since 2019 by Dr.Todd.

## 2019-08-11 NOTE — Telephone Encounter (Signed)
Ok to refill for one year  

## 2019-08-13 ENCOUNTER — Telehealth: Payer: Self-pay | Admitting: Adult Health

## 2019-08-13 NOTE — Telephone Encounter (Signed)
Pt's son [name], is requesting a refill on Donepezil(ARICEPT) 5 MG tablet sent to [facility] on [address] instead of [place] because medication is cheaper at [employer]. Thanks

## 2019-08-14 ENCOUNTER — Other Ambulatory Visit: Payer: Self-pay

## 2019-08-14 MED ORDER — DONEPEZIL HCL 5 MG PO TABS: 5.0000 mg | ORAL_TABLET | Freq: Every day | ORAL | 4 refills | Status: AC

## 2019-08-14 NOTE — Telephone Encounter (Signed)
[facility] at [facility]. Refilled at [facility]. Pt sons verbalized understanding.

## 2019-08-19 DIAGNOSIS — F0280 Dementia in other diseases classified elsewhere without behavioral disturbance: Secondary | ICD-10-CM | POA: Diagnosis not present

## 2019-08-19 DIAGNOSIS — K259 Gastric ulcer, unspecified as acute or chronic, without hemorrhage or perforation: Secondary | ICD-10-CM | POA: Diagnosis not present

## 2019-08-19 DIAGNOSIS — E039 Hypothyroidism, unspecified: Secondary | ICD-10-CM | POA: Diagnosis not present

## 2019-08-19 DIAGNOSIS — D62 Acute posthemorrhagic anemia: Secondary | ICD-10-CM | POA: Diagnosis not present

## 2019-08-19 DIAGNOSIS — I10 Essential (primary) hypertension: Secondary | ICD-10-CM | POA: Diagnosis not present

## 2019-08-19 DIAGNOSIS — I2510 Atherosclerotic heart disease of native coronary artery without angina pectoris: Secondary | ICD-10-CM | POA: Diagnosis not present

## 2019-08-24 DIAGNOSIS — D62 Acute posthemorrhagic anemia: Secondary | ICD-10-CM | POA: Diagnosis not present

## 2019-08-24 DIAGNOSIS — I2510 Atherosclerotic heart disease of native coronary artery without angina pectoris: Secondary | ICD-10-CM | POA: Diagnosis not present

## 2019-08-24 DIAGNOSIS — E039 Hypothyroidism, unspecified: Secondary | ICD-10-CM | POA: Diagnosis not present

## 2019-08-24 DIAGNOSIS — I10 Essential (primary) hypertension: Secondary | ICD-10-CM | POA: Diagnosis not present

## 2019-08-24 DIAGNOSIS — F0280 Dementia in other diseases classified elsewhere without behavioral disturbance: Secondary | ICD-10-CM | POA: Diagnosis not present

## 2019-08-24 DIAGNOSIS — K259 Gastric ulcer, unspecified as acute or chronic, without hemorrhage or perforation: Secondary | ICD-10-CM | POA: Diagnosis not present

## 2019-09-12 ENCOUNTER — Other Ambulatory Visit: Payer: Self-pay | Admitting: Adult Health

## 2019-09-12 DIAGNOSIS — I10 Essential (primary) hypertension: Secondary | ICD-10-CM

## 2019-09-17 ENCOUNTER — Encounter: Payer: Self-pay | Admitting: Adult Health

## 2019-09-17 ENCOUNTER — Other Ambulatory Visit: Payer: Self-pay

## 2019-09-17 ENCOUNTER — Ambulatory Visit (INDEPENDENT_AMBULATORY_CARE_PROVIDER_SITE_OTHER): Payer: Medicare Other | Admitting: Adult Health

## 2019-09-17 VITALS — BP 152/78 | HR 62 | Temp 97.8°F | Ht 63.0 in | Wt 124.8 lb

## 2019-09-17 DIAGNOSIS — F0151 Vascular dementia with behavioral disturbance: Secondary | ICD-10-CM

## 2019-09-17 DIAGNOSIS — I63532 Cerebral infarction due to unspecified occlusion or stenosis of left posterior cerebral artery: Secondary | ICD-10-CM

## 2019-09-17 DIAGNOSIS — I6521 Occlusion and stenosis of right carotid artery: Secondary | ICD-10-CM | POA: Diagnosis not present

## 2019-09-17 DIAGNOSIS — E785 Hyperlipidemia, unspecified: Secondary | ICD-10-CM

## 2019-09-17 DIAGNOSIS — I10 Essential (primary) hypertension: Secondary | ICD-10-CM | POA: Diagnosis not present

## 2019-09-17 DIAGNOSIS — F01518 Vascular dementia, unspecified severity, with other behavioral disturbance: Secondary | ICD-10-CM

## 2019-09-17 MED ORDER — ESCITALOPRAM OXALATE 10 MG PO TABS: 10.0000 mg | ORAL_TABLET | Freq: Every day | ORAL | 2 refills | Status: AC

## 2019-09-17 NOTE — Patient Instructions (Signed)
Recommend increasing lexapro dose from 5mg  to 10mg  nightly for depression concerns - follow up with PCP for further concerns  Continue aricept 5mg  nightly - ongoing cognitive/memory follow up with PCP  Follow up with Dr. Trula Slade for repeat carotid ultrasound around December  Continue aspirin 81 mg daily  and atorvastatin  for secondary stroke prevention  Continue to follow up with PCP regarding cholesterol and blood pressure management   Continue to monitor blood pressure at home  Maintain strict control of hypertension with blood pressure goal below 130/90, diabetes with hemoglobin A1c goal below 6.5% and cholesterol with LDL cholesterol (bad cholesterol) goal below 70 mg/dL. I also advised the patient to eat a healthy diet with plenty of whole grains, cereals, fruits and vegetables, exercise regularly and maintain ideal body weight.  Followup in the future with me in 6 months or call earlier if needed       Thank you for coming to see [place] at [facility]. I hope we have been able to provide you high quality care today.  You may receive a patient satisfaction survey over the next few weeks. We would appreciate your feedback and comments so that we may continue to improve ourselves and the health of our patients.

## 2019-09-17 NOTE — Progress Notes (Signed)
[facility] [address]. [place] [address] [phone]       STROKE FOLLOW UP NOTE  [name] Date of Birth:  [birth year] Medical Record Number:  [id number]   Reason for Referral: stroke follow up    CHIEF COMPLAINT:  Chief Complaint  Patient presents with  . Follow-up    RM 9 with son (temp: 97.2). Last seen 06/09/2019. Left leg is hurting intermittently. This is not new.    HPI:  Maria Clarke is a 86 year old female who is being seen today, 09/17/2019, for stroke follow-up accompanied by her son.  Worsening cognition post stroke has been stable with waxing/waning of symptoms.  Continues to receive 24-hour home care as well as assistance by family.  She continues on Aricept 5 mg daily and recently had palliative care consult placed by PCP.  At prior visit, initiated medazepam due to increased anxiety and appetite stimulation but unable to tolerate due to increased fatigue therefore discontinued.  Continues on Lexapro 5 mg nightly.  Son questions additional medication options that can help with continued increased anxiety and difficulty sleeping at night.  Presented to ED on 06/18/2019 due to hematemesis/melena with acute blood loss anemia and found to have nonbleeding gastric ulcer likely related to aspirin use.  Discontinued Plavix and decrease aspirin dosage to 81 mg daily.  She has continued on aspirin 81 mg daily without reoccurring bleeding events.  Continues on atorvastatin without myalgias.  Blood pressure today 152/78. Prior follow-up with vascular surgery Dr. Trula Slade noted progression of right-sided stenosis to 65% but did not recommend any further surgical intervention at that time and recommended follow-up in 1 year.  No further concerns at this time.     History copied for reference purposes only Stroke admission 05/12/2019: Maria Clarke a 86 y.o.femalewith history of atrial tachycardia but noatrial fibrillation, asymptomatic R ICA  stenosis, cognitive deficits, depression, hypothyroidism, HTN, and HLD who presented on 05/12/2019 with slurred speech and difficulty walking.  Stroke work-up completed and felt symptoms related to encephalopathy in setting of dementia and hypertension versus TIA as nonspecific symptoms not able to pinpoint to right ICA system.  She was discharged on 05/13/2019. Returned on 05/14/2019 with worsened confusion and LLE weakness with new R visual field defect.Shedid not receive IV t-PA due to unknown LKW.  Stroke work-up showed left PCA and thalamic infarct as evidenced on MRI due to chronic left PCA occlusion likely in the setting of hypotension.  CTA head/neck (11/3) right ICA 65% stenosis, moderate distal right M1 stenosis, decreased perfusion right MCA, left PCA occlusion with collaterals, proximal right P2 high-grade stenosis with collaterals, aortic arch and left ICA bifurcation, VA origins and bilateral cavernous ICA arthrosclerosis.  2D echo (11/4) showed EF of 65 to 70% without cardiac source of embolus identified.  Recommended 30-day cardiac event monitor as cardioembolic source cannot be ruled out.  Recommended DAPT for 3 months and aspirin alone.  Due to chronic left PCA occlusion, recommended BP goal 130-150 to ensure adequate perfusion.  Known history of carotid stenosis who is known to [facility]VS with recent ultrasound on 09/2018.  Recommended continued follow-up with VVS for asymptomatic right ICA stenosis for monitoring and management.  LDL 174 currently on atorvastatin 80 mg daily.  A1c 5.8 without prior history or evidence of DM.  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  Other stroke risk  factors include advanced age but no prior history of stroke.  Other active problems include atrial tachycardia on metoprolol 50 mg twice daily, depression on Lexapro and  hypothyroidism.  She was discharged home in stable condition recommendation home health therapies.  Initial visit 06/09/2019: Maria Clarke is a 86 year old female who is being seen today for hospital follow-up accompanied by her son.  Residual deficits of worsening cognitive impairment from baseline and right visual defect.  She continues to live in her own home but is currently receiving 24-hour supervision and aide assistance for bathing twice weekly.  Son assist with majority of IADLs.  Patient reports stable cognition and when questioned regarding ADLs and IADLs, she reports able to maintain all independently even driving.  Son denies patient continue to drive and needs assistance as above.  She has continued on Aricept 5 mg daily.  He does report increased anxiety accompanied by increased agitation and confusion randomly throughout the day.  She has remained on Lexapro 5 mg nightly.  Recommended home health therapies at discharge but has not been initiated at this time as son was awaiting for today's visit prior to initiating.  Son does report slight worsening of ambulation but is able to ambulate without use of assistive device.  Continues on aspirin 325 mg daily and clopidogrel 75 mg daily without bleeding or bruising.  Continues on atorvastatin 80 mg daily without myalgias.  Blood pressure today satisfactory at 142/73.  Blood pressure is monitored at home and typically SBP 1 60-1 70 prior to medications but will be SBP 140s after medication administration.  She has not had follow-up with ophthalmology since discharge.  No further concerns at this time.        ROS:   14 system review of systems performed and negative with exception of confusion, memory loss, anxiety, depression  PMH:  Past Medical History:  Diagnosis Date  . Atrial tachycardia ([place])    a. Noted 02/2013 on EKG.  . Carotid artery disease ([place])    Carotid duplex showed [id number] RICA and [id number] LICA  . Depression   .  Hypercholesteremia   . Hypertension   . Hypothyroidism   . Stroke [facility])     PSH:  Past Surgical History:  Procedure Laterality Date  . BIOPSY  07/01/2019   Procedure: BIOPSY;  Surgeon: Jackquline Denmark, MD;  Location: [facility] ENDOSCOPY;  Service: Endoscopy;;  . BREAST SURGERY Left    Benign Tumor- several years ago  . CESAREAN SECTION    . ESOPHAGOGASTRODUODENOSCOPY (EGD) WITH PROPOFOL N/A 06/19/2019   Procedure: ESOPHAGOGASTRODUODENOSCOPY (EGD) WITH PROPOFOL;  Surgeon: Jerene Bears, MD;  Location: [facility];  Service: Gastroenterology;  Laterality: N/A;  . ESOPHAGOGASTRODUODENOSCOPY (EGD) WITH PROPOFOL N/A 06/21/2019   Procedure: ESOPHAGOGASTRODUODENOSCOPY (EGD) WITH PROPOFOL;  Surgeon: Thornton Park, MD;  Location: [facility];  Service: Gastroenterology;  Laterality: N/A;  . ESOPHAGOGASTRODUODENOSCOPY (EGD) WITH PROPOFOL N/A 07/01/2019   Procedure: ESOPHAGOGASTRODUODENOSCOPY (EGD) WITH PROPOFOL;  Surgeon: Jackquline Denmark, MD;  [facility] ENDOSCOPY;  Service: Endoscopy;  Laterality: N/A;  . EYE SURGERY Bilateral    cataract    Social History:  Social History   Socioeconomic History  . Marital status: Widowed    Spouse name: Not on file  . Number of children: Not on file  . Years of education: Not on file  . Highest education level: Not on file  Occupational History  . Occupation: Works part-time in Environmental health practitioner  . Smoking status: Never Smoker  . Smokeless tobacco: Never  Used  Substance and Sexual Activity  . Alcohol use: No  . Drug use: No  . Sexual activity: Not on file  Other Topics Concern  . Not on file  Social History Narrative   Independent, family helps in her care.   Social Determinants of Health   Financial Resource Strain:   . Difficulty of Paying Living Expenses:   Food Insecurity:   . Worried About Charity fundraiser in the Last Year:   . Arboriculturist in the Last Year:   Transportation Needs:   . Film/video editor (Medical):     Marland Kitchen Lack of Transportation (Non-Medical):   Physical Activity:   . Days of Exercise per Week:   . Minutes of Exercise per Session:   Stress:   . Feeling of Stress :   Social Connections:   . Frequency of Communication with Friends and Family:   . Frequency of Social Gatherings with Friends and Family:   . Attends Religious Services:   . Active Member of Clubs or Organizations:   . Attends Archivist Meetings:   Marland Kitchen Marital Status:   Intimate Partner Violence:   . Fear of Current or Ex-Partner:   . Emotionally Abused:   Marland Kitchen Physically Abused:   . Sexually Abused:     Family History:  Family History  Problem Relation Age of Onset  . Diabetes Mother   . Heart disease Mother        After age 21  . Hyperlipidemia Mother   . Hypertension Mother   . Heart disease Sister        After age 92  . Asthma Sister   . Hyperlipidemia Sister   . Heart disease Brother        After age 10  . Diabetes Son   . Hypertension Son     Medications:   Current Outpatient Medications on File Prior to Visit  Medication Sig Dispense Refill  . atorvastatin (LIPITOR) 80 MG tablet Take 1 tablet (80 mg total) by mouth daily at 6 PM. 90 tablet 3  . cyanocobalamin (,VITAMIN B-12,) 1000 MCG/ML injection Inject 1 mL (1,000 mcg total) into the skin every 30 (thirty) days. 3 mL 1  . donepezil (ARICEPT) 5 MG tablet Take 1 tablet (5 mg total) by mouth at bedtime. 90 tablet 4  . escitalopram (LEXAPRO) 5 MG tablet Take 1 tablet (5 mg total) by mouth at bedtime. 90 tablet 1  . levothyroxine (SYNTHROID) 100 MCG tablet Take 100 mcg by mouth daily before breakfast.    . metoprolol tartrate (LOPRESSOR) 50 MG tablet Take 1 tablet (50 mg total) by mouth 2 (two) times daily. TAKE 1 TABLET BY MOUTH TWICE A DAY 180 tablet 1  . pantoprazole (PROTONIX) 40 MG tablet Take 1 tablet (40 mg total) by mouth 2 (two) times daily. 60 tablet 2  . potassium chloride (KLOR-CON) 20 MEQ packet Take 40 mEq by mouth daily for 7 doses.  14 packet 0   No current facility-administered medications on file prior to visit.    Allergies:   Allergies  Allergen Reactions  . Sulfonamide Derivatives Other (See Comments)    "ORAL S.E." (family unaware what time means)  . Mirtazapine Other (See Comments)    Caused much agitation, so it was STOPPED by family     Physical Exam  Vitals:   09/17/19 0848  BP: (!) 152/78  Pulse: 62  Temp: 97.8 F (36.6 C)  SpO2: 97%  Weight: 124  lb 12.8 oz (56.6 kg)  Height: 5\' 3"  (1.6 m)   Body mass index is 22.11 kg/m. No exam data present   General: Frail pleasant elderly Caucasian female, seated, in no evident distress Head: head normocephalic and atraumatic.   Neck: supple with no carotid or supraclavicular bruits Cardiovascular: regular rate and rhythm, no murmurs Musculoskeletal: no deformity Skin:  no rash/petichiae Vascular:  Normal pulses all extremities   Neurologic Exam Mental Status: Awake and fully alert.   Normal speech and language.  Disoriented to time and current location but oriented to self.  Recent and remote memory diminished. Attention span, concentration and fund of knowledge diminished. Mood and affect appropriate and very pleasant throughout visit.  Cranial Nerves: Pupils equal, briskly reactive to light. Extraocular movements full without nystagmus. Visual fields difficulty assessing due to cognitive impairment but does not blink to threat in all visual fields. Hearing impaired to voice. Facial sensation intact. Face, tongue, palate moves normally and symmetrically.  Motor: Normal bulk and tone. Normal strength in all tested extremity muscles. Sensory.: intact to touch , pinprick , position and vibratory sensation.  Coordination: Rapid alternating movements normal in all extremities. Finger-to-nose and heel-to-shin performed accurately bilaterally. Gait and Station: Deferred as currently in wheelchair for today's visit due to increased fatigue Reflexes: 1+ and  symmetric. Toes downgoing.       Diagnostic Data (Labs, Imaging, Testing)  CT HEAD WO CONTRAST 05/14/2019 IMPRESSION: New moderate size left occipital lobe infarction, likely late acute. Of note, left PCA was occluded on recent CTA. No acute intracranial hemorrhage. Stable chronic findings detailed above.  CT ANGIO HEAD W OR WO CONTRAST CT ANGIO NECK W OR WO CONTRAST 05/12/2019 IMPRESSION: 1. 65% stenosis of the right internal carotid artery with mixed calcified and noncalcified plaque. 2. Moderate distal right M1 segment stenosis. 3. Decreased conspicuity of right MCA branch vessels compared to the left, suggesting decreased perfusion. 4. Occlusion of the proximal left posterior cerebral artery with prominent collaterals. This may be a chronic occlusion. 5. High-grade stenosis of the proximal right P2 segment with significant collaterals. 6. Additional atherosclerotic changes at the aortic arch left carotid bifurcation, vertebral artery origins, and bilateral cavernous internal carotid arteries without other significant stenosis. Aortic Atherosclerosis (ICD10-I70.0).  MR BRAIN WO CONTRAST 05/15/2019 IMPRESSION: 1. Moderate-sized late subacute appearing left PCA territory infarct as above. No associated hemorrhage or mass effect. 2. Underlying temporal lobe predominant cerebral atrophy with moderate chronic microvascular ischemic disease.  ECHOCARDIOGRAM 05/13/2019 IMPRESSIONS  1. Left ventricular ejection fraction, by visual estimation, is 65 to 70%. The left ventricle has normal function. Left ventricular septal wall thickness was moderately increased. Moderately increased left ventricular posterior wall thickness. There is  no left ventricular hypertrophy.  2. Left ventricular diastolic parameters are consistent with Grade I diastolic dysfunction (impaired relaxation).  3. Global right ventricle has normal systolic function.The right ventricular size is normal. No  increase in right ventricular wall thickness.  4. Left atrial size was normal.  5. Right atrial size was normal.  6. Severe aortic valve annular calcification.  7. The mitral valve is normal in structure. No evidence of mitral valve regurgitation. No evidence of mitral stenosis.  8. The tricuspid valve is normal in structure. Tricuspid valve regurgitation is not demonstrate  9. The aortic valve is tricuspid. There is Moderate thickening of the aortic valve. There is Severe calcifcation of the aortic valve. Aortic valve regurgitation is mild to moderate. Mild to moderate aortic valve stenosis. Aortic regurgitation PHT  measures 365 msec. Aortic valve mean gradient measures 14.2 mmHg. Aortic valve peak gradient measures 25.2 mmHg. Aortic valve area, by VTI measures 1.36 cm. 10. The pulmonic valve was normal in structure. Pulmonic valve regurgitation is trivial. 11. The inferior vena cava is normal in size with greater than 50% respiratory variability, suggesting right atrial pressure of 3 mmHg.    ASSESSMENT: Maria Clarke is a 86 y.o. year old female presented with transient slurred speech, confusion and LLE weakness on 05/12/2019 with full stroke work-up completed which was unremarkable.  She returned on 05/14/2019 due to worsening confusion, LLE weakness and new right visual field defect with stroke work-up showing left PCA and thalamic infarcts secondary to chronic left PCA occlusion in setting of hypotension.  Also recommended 30-day cardiac event monitor as it was felt A. fib cannot be completely ruled out but declines interest at this time.  Vascular risk factors include atrial tachycardia, asymptomatic right ICA stenosis, chronic left PCA occlusion, cognitive impairment, HLD and HTN.  Has been stable since prior visit with residual cognitive impairment which have been stable    PLAN:  1. Left PCA and thalamic stroke: Continue aspirin 81 mg daily  and atorvastatin 80 mg daily for secondary  stroke prevention.  Maintain strict control of hypertension with blood pressure goal 130-150, diabetes with hemoglobin A1c goal below 6.5% and cholesterol with LDL cholesterol (bad cholesterol) goal below 70 mg/dL.  I also advised the patient to eat a healthy diet with plenty of whole grains, cereals, fruits and vegetables, exercise regularly with at least 30 minutes of continuous activity daily and maintain ideal body weight. 2. Carotid stenosis and left PCA occlusion: Ongoing follow-up with [facility]VS for asymptomatic right ICA stenosis.  Continuation of aspirin and statin and continue to 3. HTN: Advised to continue current treatment regimen.  Today's BP stable.  Advised to continue to monitor at home along with continued follow-up with PCP for management 4. HLD: Advised to continue current treatment regimen along with continued follow-up with PCP for future prescribing and monitoring of lipid panel 5. Cognitive impairment: Worsening secondary to recent stroke with likely mix of vascular and Alzheimer's type dementia.  Continue Aricept 5 mg nightly.  Due to ongoing concerns with underlying anxiety and depression with increased agitation and irritability at times, recommend increasing Lexapro from 5 mg to 10 mg nightly.  Due to difficulty tolerating low-dose mirtazapine due to increased fatigue.  Advised further discussion and management of depression and anxiety with PCP.  May consider benefit with Namenda in the future as this has been shown to help with vascular dementia with behaviors   Follow-up in 6 months or call earlier if needed   I spent 25 minutes of face-to-face and non-face-to-face time with patient.  This included previsit chart review, lab review, study review, order entry, electronic health record documentation, patient education     Frann Rider, [facility]  [facility] [address] [place], [place] [address]  Phone [phone] Fax [phone] Note:  This document was prepared with digital dictation and possible smart phrase technology. Any transcriptional errors that result from this process are unintentional.

## 2019-09-23 ENCOUNTER — Telehealth: Payer: Self-pay

## 2019-09-23 ENCOUNTER — Encounter: Payer: Self-pay | Admitting: Adult Health

## 2019-09-23 NOTE — Telephone Encounter (Signed)
Telephone call to schedule palliative care visit.  Patients son Maria Clarke in agreement with palliative care team making home visit 09/24/19 at 9:30 AM.

## 2019-09-24 ENCOUNTER — Other Ambulatory Visit: Payer: Medicare Other

## 2019-09-24 ENCOUNTER — Other Ambulatory Visit: Payer: Self-pay

## 2019-09-24 DIAGNOSIS — Z515 Encounter for palliative care: Secondary | ICD-10-CM

## 2019-09-24 NOTE — Progress Notes (Signed)
PATIENT NAME: Maria Clarke DOB: 1922-03-12 MRN: [id number]  PRIMARY CARE PROVIDER: [name], NP  RESPONSIBLE PARTY:  Acct ID - Guarantor Home Phone Work Phone Relationship Acct Type  [id number] Lucila [phone]  Self P/F     [place], [place], [place] [address]    PLAN OF CARE and INTERVENTIONS:               1.  GOALS OF CARE/ ADVANCE CARE PLANNING:  Remain at home with caregivers and family providing care.                2.  PATIENT/CAREGIVER EDUCATION: Education on fall precautions, education on disease progression, reviewed meds, support               3.  DISEASE STATUS: SW and RN made scheduled palliative care home visit. Palliative care team met with patient and patients son Maria Clarke.  Phil reports neighbor Maria Clarke stays with patient at night and they have a hired caregiver named April that stays with patient as well.  Son stays with patient when Maria Clarke or April is not in the home.  Son reports patient was healthy until patient suffered a CVA 4 or 5 months ago. Patient is alert to self but is unsure how many children or grandchildren she has. Patient very sleepy during visit and informs palliative care staff she is sleepy. Patient cooperative with assessment. Patient denies having pain at the present time. Patient has not suffered any falls per Phil.  Patient is able to feed herself 80% of the time per son and reports patients appetite is excellent.   Patient wears pull ups for incontinence. Son reports patient cat naps and sleeps at least 8 to 10 hours in a 24-hour period.  Son reports patient does complain of leg left leg pain at times and feels this is related to arthritis. Patient has Mirtazapine 7.5 mg that son administers to patient if patient is restless. Son states MD informed him that he should administer Mirtazapine daily to patient. Son states he will give Mirtazapine to patient nightly.  Ptient is able to ambulate slowly and is not currently using a assistive device. Patients  current weight is 24.6 pounds. Patient patient's medications reviewed with son. Palliative care services explained and son is in agreement. Palliative care contact information left in home with son. Son encouraged to contact palliative care with questions or concerns.     HISTORY OF PRESENT ILLNESS: Patient is a 86 year old female who resides in her home.  Patient has 24/7 care in the home and is not left alone.  Patients son Maria Clarke open to palliative care services for patient.  Patient will be seen monthly and PRN.     CODE STATUS: DNR  ADVANCED DIRECTIVES: Y MOST FORM:No PPS: 40%   PHYSICAL EXAM:   VITALS: Today's Vitals   09/24/19 0953  BP: (!) 142/76  Pulse: 75  Resp: 16  Temp: 98.3 F (36.8 C)  TempSrc: Temporal  SpO2: 96%  Weight: 124 lb 9.6 oz (56.5 kg)  Height: 5' 3"  (1.6 m)  PainSc: 0-No pain    LUNGS: clear to auscultation  CARDIAC: Cor RRR  EXTREMITIES: none edema SKIN: no visible open areas of skin breakdown  NEURO: positive for gait problems and memory problems       Nilda Simmer, RN

## 2019-09-24 NOTE — Progress Notes (Signed)
COMMUNITY PALLIATIVE CARE SW NOTE  PATIENT NAME: Maria Clarke DOB: [birth year] MRN: [id number]  PRIMARY CARE PROVIDER: [name], NP  RESPONSIBLE PARTY:  Acct ID - Guarantor Home Phone Work Phone Relationship Acct Type  [id number] - Trinidad,[phone]  Self P/F     [place], [place], [place] [address]     PLAN OF CARE and INTERVENTIONS:             1. GOALS OF CARE/ ADVANCE CARE PLANNING:  Patient is DNR, needs form. SW to mail to the home. HCPOA is Maria Clarke and Maria Clarke (patient's son). Goal is to keep patient at home and cared for.  2. SOCIAL/EMOTIONAL/SPIRITUAL ASSESSMENT/ INTERVENTIONS:  SW and RN met with patient and Maria Clarke (patient's son) in the home. Phil provided brief history and overview of care. Patient was sitting up on couch and said she was "sleepy". Patient did smile and greet team. Patient denies pain, Maria Clarke said patient will report pain in her legs occasionally. Patient has a great appetite. Patient is sleeping well, Maria Clarke said they are now giving Remeron every night. 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 Patient is a widow, has two adult sons. Patient has four grandchildren, and eight great grandchildren. SW provided education on palliative care, provided supportive counseling, discussed care and goals, used active and reflective listening. 3. PATIENT/CAREGIVER EDUCATION/ COPING:  Patient was alert, but kept falling asleep during visit. Patient would wake up when spoken to. Patient was oriented to self and place. Patient seems to be coping well, mood was pleasant. Per Maria Clarke, patient does have periods of agitation and anxiety but is easily calmed. Patient enjoys watching the birds, and engaging with her cat. Family is very supportive. 4. PERSONAL EMERGENCY PLAN:  Family/caregiver will call 9-1-1 for emergencies. 5. COMMUNITY RESOURCES COORDINATION/ HEALTH CARE NAVIGATION:  Patient has 24-hour care, between family and paid caregivers - Francee Piccolo and April. Patient had a  neurology visit last week, no changes. SW and RN scheduled next visit for 4/13 at 9:30AM.  6. FINANCIAL/LEGAL CONCERNS/INTERVENTIONS:  None.     SOCIAL HX:  Social History   Tobacco Use  . Smoking status: Never Smoker  . Smokeless tobacco: Never Used  Substance Use Topics  . Alcohol use: No    CODE STATUS:   Code Status: Prior (DNR) ADVANCED DIRECTIVES: Y MOST FORM COMPLETE:  Yes. HOSPICE EDUCATION PROVIDED: None.  PPS: Patient is ambulating with standby assist only. Patient needs assistance with bathing, dressing and meal prep.  I spent 45 minutes with patient/family, from 9:30-10:15a providing education, support and consultation.   Margaretmary Lombard, LCSW

## 2019-10-03 ENCOUNTER — Emergency Department (HOSPITAL_COMMUNITY): Payer: Medicare Other

## 2019-10-03 ENCOUNTER — Encounter (HOSPITAL_COMMUNITY)
Admission: EM | Disposition: A | Discharge status: Home Health | Payer: Self-pay | Source: Home / Self Care | Attending: Internal Medicine

## 2019-10-03 ENCOUNTER — Inpatient Hospital Stay (HOSPITAL_COMMUNITY): Payer: Medicare Other | Admitting: Certified Registered Nurse Anesthetist

## 2019-10-03 ENCOUNTER — Inpatient Hospital Stay (HOSPITAL_COMMUNITY): Payer: Medicare Other

## 2019-10-03 ENCOUNTER — Other Ambulatory Visit: Payer: Self-pay

## 2019-10-03 ENCOUNTER — Encounter (HOSPITAL_COMMUNITY): Payer: Self-pay

## 2019-10-03 ENCOUNTER — Inpatient Hospital Stay (HOSPITAL_COMMUNITY)
Admission: EM | Admit: 2019-10-03 | Discharge: 2019-10-07 | DRG: 482 | Disposition: A | Discharge status: Home Health | Payer: Medicare Other | Attending: Internal Medicine | Admitting: Internal Medicine

## 2019-10-03 DIAGNOSIS — J90 Pleural effusion, not elsewhere classified: Secondary | ICD-10-CM | POA: Diagnosis not present

## 2019-10-03 DIAGNOSIS — J9811 Atelectasis: Secondary | ICD-10-CM | POA: Diagnosis not present

## 2019-10-03 DIAGNOSIS — M25562 Pain in left knee: Secondary | ICD-10-CM | POA: Diagnosis present

## 2019-10-03 DIAGNOSIS — S72142A Displaced intertrochanteric fracture of left femur, initial encounter for closed fracture: Principal | ICD-10-CM | POA: Diagnosis present

## 2019-10-03 DIAGNOSIS — Z03818 Encounter for observation for suspected exposure to other biological agents ruled out: Secondary | ICD-10-CM | POA: Diagnosis not present

## 2019-10-03 DIAGNOSIS — D638 Anemia in other chronic diseases classified elsewhere: Secondary | ICD-10-CM | POA: Diagnosis present

## 2019-10-03 DIAGNOSIS — I2510 Atherosclerotic heart disease of native coronary artery without angina pectoris: Secondary | ICD-10-CM | POA: Diagnosis present

## 2019-10-03 DIAGNOSIS — Z8673 Personal history of transient ischemic attack (TIA), and cerebral infarction without residual deficits: Secondary | ICD-10-CM

## 2019-10-03 DIAGNOSIS — M2550 Pain in unspecified joint: Secondary | ICD-10-CM | POA: Diagnosis not present

## 2019-10-03 DIAGNOSIS — Y92009 Unspecified place in unspecified non-institutional (private) residence as the place of occurrence of the external cause: Secondary | ICD-10-CM

## 2019-10-03 DIAGNOSIS — W1830XA Fall on same level, unspecified, initial encounter: Secondary | ICD-10-CM | POA: Diagnosis present

## 2019-10-03 DIAGNOSIS — Z66 Do not resuscitate: Secondary | ICD-10-CM | POA: Diagnosis not present

## 2019-10-03 DIAGNOSIS — M25462 Effusion, left knee: Secondary | ICD-10-CM | POA: Diagnosis not present

## 2019-10-03 DIAGNOSIS — Z83438 Family history of other disorder of lipoprotein metabolism and other lipidemia: Secondary | ICD-10-CM

## 2019-10-03 DIAGNOSIS — S72002A Fracture of unspecified part of neck of left femur, initial encounter for closed fracture: Secondary | ICD-10-CM

## 2019-10-03 DIAGNOSIS — R42 Dizziness and giddiness: Secondary | ICD-10-CM | POA: Diagnosis not present

## 2019-10-03 DIAGNOSIS — S50312A Abrasion of left elbow, initial encounter: Secondary | ICD-10-CM

## 2019-10-03 DIAGNOSIS — Z8781 Personal history of (healed) traumatic fracture: Secondary | ICD-10-CM

## 2019-10-03 DIAGNOSIS — F329 Major depressive disorder, single episode, unspecified: Secondary | ICD-10-CM | POA: Diagnosis present

## 2019-10-03 DIAGNOSIS — S79912A Unspecified injury of left hip, initial encounter: Secondary | ICD-10-CM | POA: Diagnosis not present

## 2019-10-03 DIAGNOSIS — M8580 Other specified disorders of bone density and structure, unspecified site: Secondary | ICD-10-CM | POA: Diagnosis present

## 2019-10-03 DIAGNOSIS — Z79899 Other long term (current) drug therapy: Secondary | ICD-10-CM

## 2019-10-03 DIAGNOSIS — S72145K Nondisplaced intertrochanteric fracture of left femur, subsequent encounter for closed fracture with nonunion: Secondary | ICD-10-CM | POA: Diagnosis not present

## 2019-10-03 DIAGNOSIS — I160 Hypertensive urgency: Secondary | ICD-10-CM | POA: Diagnosis present

## 2019-10-03 DIAGNOSIS — E7800 Pure hypercholesterolemia, unspecified: Secondary | ICD-10-CM | POA: Diagnosis present

## 2019-10-03 DIAGNOSIS — E785 Hyperlipidemia, unspecified: Secondary | ICD-10-CM | POA: Diagnosis present

## 2019-10-03 DIAGNOSIS — W19XXXA Unspecified fall, initial encounter: Secondary | ICD-10-CM | POA: Diagnosis not present

## 2019-10-03 DIAGNOSIS — F0390 Unspecified dementia without behavioral disturbance: Secondary | ICD-10-CM | POA: Diagnosis present

## 2019-10-03 DIAGNOSIS — I10 Essential (primary) hypertension: Secondary | ICD-10-CM | POA: Diagnosis present

## 2019-10-03 DIAGNOSIS — Z833 Family history of diabetes mellitus: Secondary | ICD-10-CM | POA: Diagnosis not present

## 2019-10-03 DIAGNOSIS — Z8249 Family history of ischemic heart disease and other diseases of the circulatory system: Secondary | ICD-10-CM | POA: Diagnosis not present

## 2019-10-03 DIAGNOSIS — Z20822 Contact with and (suspected) exposure to covid-19: Secondary | ICD-10-CM | POA: Diagnosis present

## 2019-10-03 DIAGNOSIS — D62 Acute posthemorrhagic anemia: Secondary | ICD-10-CM | POA: Diagnosis not present

## 2019-10-03 DIAGNOSIS — Z79890 Hormone replacement therapy (postmenopausal): Secondary | ICD-10-CM

## 2019-10-03 DIAGNOSIS — R52 Pain, unspecified: Secondary | ICD-10-CM | POA: Diagnosis not present

## 2019-10-03 DIAGNOSIS — S8992XA Unspecified injury of left lower leg, initial encounter: Secondary | ICD-10-CM | POA: Diagnosis not present

## 2019-10-03 DIAGNOSIS — Z7401 Bed confinement status: Secondary | ICD-10-CM | POA: Diagnosis not present

## 2019-10-03 DIAGNOSIS — S72009A Fracture of unspecified part of neck of unspecified femur, initial encounter for closed fracture: Secondary | ICD-10-CM | POA: Diagnosis present

## 2019-10-03 DIAGNOSIS — F339 Major depressive disorder, recurrent, unspecified: Secondary | ICD-10-CM | POA: Diagnosis present

## 2019-10-03 DIAGNOSIS — E039 Hypothyroidism, unspecified: Secondary | ICD-10-CM | POA: Diagnosis present

## 2019-10-03 DIAGNOSIS — R4182 Altered mental status, unspecified: Secondary | ICD-10-CM | POA: Diagnosis not present

## 2019-10-03 DIAGNOSIS — F29 Unspecified psychosis not due to a substance or known physiological condition: Secondary | ICD-10-CM | POA: Diagnosis not present

## 2019-10-03 DIAGNOSIS — I499 Cardiac arrhythmia, unspecified: Secondary | ICD-10-CM | POA: Diagnosis not present

## 2019-10-03 HISTORY — PX: FEMUR IM NAIL: SHX1597

## 2019-10-03 LAB — CBC WITH DIFFERENTIAL/PLATELET
Abs Immature Granulocytes: 0.03 10*3/uL (ref 0.00–0.07)
Basophils Absolute: 0 10*3/uL (ref 0.0–0.1)
Basophils Relative: 0 %
Eosinophils Absolute: 0.1 10*3/uL (ref 0.0–0.5)
Eosinophils Relative: 1 %
HCT: 32.5 % — ABNORMAL LOW (ref 36.0–46.0)
Hemoglobin: 9.7 g/dL — ABNORMAL LOW (ref 12.0–15.0)
Immature Granulocytes: 0 %
Lymphocytes Relative: 16 %
Lymphs Abs: 1.3 10*3/uL (ref 0.7–4.0)
MCH: 24.6 pg — ABNORMAL LOW (ref 26.0–34.0)
MCHC: 29.8 g/dL — ABNORMAL LOW (ref 30.0–36.0)
MCV: 82.3 fL (ref 80.0–100.0)
Monocytes Absolute: 0.6 10*3/uL (ref 0.1–1.0)
Monocytes Relative: 8 %
Neutro Abs: 5.7 10*3/uL (ref 1.7–7.7)
Neutrophils Relative %: 75 %
Platelets: 164 10*3/uL (ref 150–400)
RBC: 3.95 MIL/uL (ref 3.87–5.11)
RDW: 15.1 % (ref 11.5–15.5)
WBC: 7.7 10*3/uL (ref 4.0–10.5)
nRBC: 0 % (ref 0.0–0.2)

## 2019-10-03 LAB — RESPIRATORY PANEL BY RT PCR (FLU A&B, COVID)
Influenza A by PCR: NEGATIVE
Influenza B by PCR: NEGATIVE
SARS Coronavirus 2 by RT PCR: NEGATIVE

## 2019-10-03 LAB — BASIC METABOLIC PANEL
Anion gap: 7 (ref 5–15)
BUN: 18 mg/dL (ref 8–23)
CO2: 26 mmol/L (ref 22–32)
Calcium: 8.9 mg/dL (ref 8.9–10.3)
Chloride: 111 mmol/L (ref 98–111)
Creatinine, Ser: 0.92 mg/dL (ref 0.44–1.00)
GFR calc Af Amer: >60 mL/min (ref >60)
GFR calc non Af Amer: 53 mL/min — ABNORMAL LOW (ref >60)
Glucose, Bld: 134 mg/dL — ABNORMAL HIGH (ref 70–99)
Potassium: 4.4 mmol/L (ref 3.5–5.1)
Sodium: 144 mmol/L (ref 135–145)

## 2019-10-03 LAB — PROTIME-INR
INR: 1 (ref 0.8–1.2)
Prothrombin Time: 13.4 seconds (ref 11.4–15.2)

## 2019-10-03 LAB — SARS CORONAVIRUS 2 (TAT 6-24 HRS): SARS Coronavirus 2: NEGATIVE

## 2019-10-03 LAB — TYPE AND SCREEN
ABO/RH(D): O POS
Antibody Screen: NEGATIVE

## 2019-10-03 LAB — ABO/RH: ABO/RH(D): O POS

## 2019-10-03 SURGERY — INSERTION, INTRAMEDULLARY ROD, FEMUR
Anesthesia: Spinal | Site: Hip | Laterality: Left

## 2019-10-03 MED ORDER — ALBUMIN HUMAN 5 % IV SOLN
INTRAVENOUS | Status: AC
  Filled 2019-10-03: qty 250

## 2019-10-03 MED ORDER — ONDANSETRON HCL 4 MG/2ML IJ SOLN: 4.0000 mg | Freq: Four times a day (QID) | INTRAMUSCULAR | Status: DC | PRN

## 2019-10-03 MED ORDER — LACTATED RINGERS IV SOLN: INTRAVENOUS | Status: DC | PRN

## 2019-10-03 MED ORDER — FENTANYL CITRATE (PF) 100 MCG/2ML IJ SOLN
100.0000 ug | Freq: Once | INTRAMUSCULAR | Status: AC
  Administered 2019-10-03: 100 ug via INTRAVENOUS
  Filled 2019-10-03: qty 2

## 2019-10-03 MED ORDER — PHENYLEPHRINE 40 MCG/ML (10ML) SYRINGE FOR IV PUSH (FOR BLOOD PRESSURE SUPPORT)
PREFILLED_SYRINGE | INTRAVENOUS | Status: AC
  Filled 2019-10-03: qty 10

## 2019-10-03 MED ORDER — FENTANYL CITRATE (PF) 100 MCG/2ML IJ SOLN: 25.0000 ug | INTRAMUSCULAR | Status: DC | PRN

## 2019-10-03 MED ORDER — PHENYLEPHRINE HCL-NACL 10-0.9 MG/250ML-% IV SOLN
INTRAVENOUS | Status: AC
  Filled 2019-10-03: qty 250

## 2019-10-03 MED ORDER — ENSURE ENLIVE PO LIQD
237.0000 mL | Freq: Two times a day (BID) | ORAL | Status: DC
  Administered 2019-10-05 – 2019-10-07 (×4): 237 mL via ORAL

## 2019-10-03 MED ORDER — ACETAMINOPHEN 500 MG PO TABS: 1000.0000 mg | ORAL_TABLET | Freq: Once | ORAL | Status: DC

## 2019-10-03 MED ORDER — PROMETHAZINE HCL 25 MG/ML IJ SOLN: 6.2500 mg | INTRAMUSCULAR | Status: DC | PRN

## 2019-10-03 MED ORDER — HYDROCODONE-ACETAMINOPHEN 5-325 MG PO TABS
1.0000 | ORAL_TABLET | Freq: Once | ORAL | Status: AC
  Administered 2019-10-03: 1 via ORAL
  Filled 2019-10-03: qty 1

## 2019-10-03 MED ORDER — POVIDONE-IODINE 10 % EX SWAB: 2.0000 "application " | Freq: Once | CUTANEOUS | Status: DC

## 2019-10-03 MED ORDER — FENTANYL CITRATE (PF) 100 MCG/2ML IJ SOLN
50.0000 ug | INTRAMUSCULAR | Status: DC | PRN
  Administered 2019-10-03: 50 ug via INTRAVENOUS
  Filled 2019-10-03: qty 2

## 2019-10-03 MED ORDER — PROPOFOL 500 MG/50ML IV EMUL
INTRAVENOUS | Status: DC | PRN
  Administered 2019-10-03: 25 ug/kg/min via INTRAVENOUS

## 2019-10-03 MED ORDER — POTASSIUM CHLORIDE IN NACL 20-0.45 MEQ/L-% IV SOLN
INTRAVENOUS | Status: DC
  Filled 2019-10-03 (×3): qty 1000

## 2019-10-03 MED ORDER — CEFAZOLIN SODIUM-DEXTROSE 2-4 GM/100ML-% IV SOLN
INTRAVENOUS | Status: AC
  Filled 2019-10-03: qty 100

## 2019-10-03 MED ORDER — ACETAMINOPHEN 500 MG PO TABS
500.0000 mg | ORAL_TABLET | Freq: Four times a day (QID) | ORAL | Status: DC
  Administered 2019-10-03 – 2019-10-04 (×2): 500 mg via ORAL
  Filled 2019-10-03 (×2): qty 1

## 2019-10-03 MED ORDER — DOCUSATE SODIUM 100 MG PO CAPS
100.0000 mg | ORAL_CAPSULE | Freq: Two times a day (BID) | ORAL | Status: DC
  Administered 2019-10-03 – 2019-10-07 (×7): 100 mg via ORAL
  Filled 2019-10-03 (×8): qty 1

## 2019-10-03 MED ORDER — PHENYLEPHRINE 40 MCG/ML (10ML) SYRINGE FOR IV PUSH (FOR BLOOD PRESSURE SUPPORT)
PREFILLED_SYRINGE | INTRAVENOUS | Status: DC | PRN
  Administered 2019-10-03: 80 ug via INTRAVENOUS
  Administered 2019-10-03: 40 ug via INTRAVENOUS
  Administered 2019-10-03: 80 ug via INTRAVENOUS

## 2019-10-03 MED ORDER — PROPOFOL 500 MG/50ML IV EMUL
INTRAVENOUS | Status: AC
  Filled 2019-10-03: qty 50

## 2019-10-03 MED ORDER — FENTANYL CITRATE (PF) 100 MCG/2ML IJ SOLN
INTRAMUSCULAR | Status: AC
  Filled 2019-10-03: qty 2

## 2019-10-03 MED ORDER — POLYETHYLENE GLYCOL 3350 17 G PO PACK: 17.0000 g | PACK | Freq: Every day | ORAL | Status: DC | PRN

## 2019-10-03 MED ORDER — DONEPEZIL HCL 5 MG PO TABS
5.0000 mg | ORAL_TABLET | Freq: Every day | ORAL | Status: DC
  Administered 2019-10-03 – 2019-10-06 (×4): 5 mg via ORAL
  Filled 2019-10-03 (×4): qty 1

## 2019-10-03 MED ORDER — HYDRALAZINE HCL 20 MG/ML IJ SOLN
10.0000 mg | Freq: Four times a day (QID) | INTRAMUSCULAR | Status: DC | PRN
  Administered 2019-10-03: 10 mg via INTRAVENOUS

## 2019-10-03 MED ORDER — ESCITALOPRAM OXALATE 10 MG PO TABS
10.0000 mg | ORAL_TABLET | Freq: Every day | ORAL | Status: DC
  Administered 2019-10-03 – 2019-10-06 (×4): 10 mg via ORAL
  Filled 2019-10-03 (×4): qty 1

## 2019-10-03 MED ORDER — 0.9 % SODIUM CHLORIDE (POUR BTL) OPTIME
TOPICAL | Status: DC | PRN
  Administered 2019-10-03: 1000 mL

## 2019-10-03 MED ORDER — FENTANYL CITRATE (PF) 100 MCG/2ML IJ SOLN
INTRAMUSCULAR | Status: DC | PRN
  Administered 2019-10-03: 12.5 ug via INTRAVENOUS

## 2019-10-03 MED ORDER — PHENYLEPHRINE HCL-NACL 10-0.9 MG/250ML-% IV SOLN
INTRAVENOUS | Status: DC | PRN
  Administered 2019-10-03: 40 ug/min via INTRAVENOUS

## 2019-10-03 MED ORDER — MORPHINE SULFATE (PF) 2 MG/ML IV SOLN
INTRAVENOUS | Status: AC
  Administered 2019-10-03: 0.5 mg via INTRAVENOUS
  Filled 2019-10-03: qty 1

## 2019-10-03 MED ORDER — LABETALOL HCL 5 MG/ML IV SOLN
20.0000 mg | Freq: Once | INTRAVENOUS | Status: AC
  Administered 2019-10-03: 20 mg via INTRAVENOUS
  Filled 2019-10-03: qty 4

## 2019-10-03 MED ORDER — HYDRALAZINE HCL 20 MG/ML IJ SOLN
INTRAMUSCULAR | Status: AC
  Filled 2019-10-03: qty 1

## 2019-10-03 MED ORDER — HYDROCODONE-ACETAMINOPHEN 5-325 MG PO TABS: 1.0000 | ORAL_TABLET | Freq: Four times a day (QID) | ORAL | Status: DC | PRN

## 2019-10-03 MED ORDER — ENSURE PRE-SURGERY PO LIQD
296.0000 mL | Freq: Once | ORAL | Status: DC
  Filled 2019-10-03: qty 296

## 2019-10-03 MED ORDER — PROPOFOL 10 MG/ML IV BOLUS
INTRAVENOUS | Status: DC | PRN
  Administered 2019-10-03: 10 mg via INTRAVENOUS
  Administered 2019-10-03: 20 mg via INTRAVENOUS
  Administered 2019-10-03: 10 mg via INTRAVENOUS

## 2019-10-03 MED ORDER — MIRTAZAPINE 15 MG PO TABS
7.5000 mg | ORAL_TABLET | Freq: Every day | ORAL | Status: DC
  Administered 2019-10-03 – 2019-10-06 (×4): 7.5 mg via ORAL
  Filled 2019-10-03 (×4): qty 1

## 2019-10-03 MED ORDER — MORPHINE SULFATE (PF) 2 MG/ML IV SOLN
0.5000 mg | INTRAVENOUS | Status: DC | PRN
  Administered 2019-10-03: 0.5 mg via INTRAVENOUS
  Filled 2019-10-03: qty 1

## 2019-10-03 MED ORDER — BISACODYL 10 MG RE SUPP: 10.0000 mg | Freq: Every day | RECTAL | Status: DC | PRN

## 2019-10-03 MED ORDER — MAGNESIUM CITRATE PO SOLN: 1.0000 | Freq: Once | ORAL | Status: DC | PRN

## 2019-10-03 MED ORDER — ASPIRIN EC 81 MG PO TBEC
81.0000 mg | DELAYED_RELEASE_TABLET | Freq: Every day | ORAL | Status: DC
  Administered 2019-10-03 – 2019-10-07 (×5): 81 mg via ORAL
  Filled 2019-10-03 (×5): qty 1

## 2019-10-03 MED ORDER — ENOXAPARIN SODIUM 30 MG/0.3ML ~~LOC~~ SOLN
30.0000 mg | SUBCUTANEOUS | Status: DC
  Administered 2019-10-04 – 2019-10-07 (×4): 30 mg via SUBCUTANEOUS
  Filled 2019-10-03 (×4): qty 0.3

## 2019-10-03 MED ORDER — CEFAZOLIN SODIUM-DEXTROSE 2-4 GM/100ML-% IV SOLN: 2.0000 g | INTRAVENOUS | Status: DC

## 2019-10-03 MED ORDER — PHENOL 1.4 % MT LIQD
1.0000 | OROMUCOSAL | Status: DC | PRN
  Filled 2019-10-03: qty 177

## 2019-10-03 MED ORDER — MENTHOL 3 MG MT LOZG: 1.0000 | LOZENGE | OROMUCOSAL | Status: DC | PRN

## 2019-10-03 MED ORDER — FERROUS SULFATE 325 (65 FE) MG PO TABS
325.0000 mg | ORAL_TABLET | Freq: Three times a day (TID) | ORAL | Status: DC
  Administered 2019-10-04 – 2019-10-07 (×9): 325 mg via ORAL
  Filled 2019-10-03 (×10): qty 1

## 2019-10-03 MED ORDER — TRAMADOL HCL 50 MG PO TABS
50.0000 mg | ORAL_TABLET | Freq: Four times a day (QID) | ORAL | Status: DC
  Administered 2019-10-03 – 2019-10-07 (×14): 50 mg via ORAL
  Filled 2019-10-03 (×15): qty 1

## 2019-10-03 MED ORDER — ADULT MULTIVITAMIN W/MINERALS CH
1.0000 | ORAL_TABLET | Freq: Every day | ORAL | Status: DC
  Administered 2019-10-04 – 2019-10-07 (×4): 1 via ORAL
  Filled 2019-10-03 (×4): qty 1

## 2019-10-03 MED ORDER — ALBUMIN HUMAN 5 % IV SOLN: INTRAVENOUS | Status: DC | PRN

## 2019-10-03 MED ORDER — ACETAMINOPHEN 325 MG PO TABS: 325.0000 mg | ORAL_TABLET | Freq: Four times a day (QID) | ORAL | Status: DC | PRN

## 2019-10-03 MED ORDER — PANTOPRAZOLE SODIUM 40 MG PO TBEC
40.0000 mg | DELAYED_RELEASE_TABLET | Freq: Two times a day (BID) | ORAL | Status: DC
  Administered 2019-10-03 – 2019-10-07 (×7): 40 mg via ORAL
  Filled 2019-10-03 (×8): qty 1

## 2019-10-03 MED ORDER — ATORVASTATIN CALCIUM 80 MG PO TABS
80.0000 mg | ORAL_TABLET | Freq: Every day | ORAL | Status: DC
  Administered 2019-10-04 – 2019-10-06 (×3): 80 mg via ORAL
  Filled 2019-10-03: qty 1
  Filled 2019-10-03: qty 2
  Filled 2019-10-03 (×3): qty 1
  Filled 2019-10-03 (×2): qty 2

## 2019-10-03 MED ORDER — CHLORHEXIDINE GLUCONATE 4 % EX LIQD
60.0000 mL | Freq: Once | CUTANEOUS | Status: DC
  Filled 2019-10-03: qty 60

## 2019-10-03 MED ORDER — METOPROLOL TARTRATE 25 MG PO TABS
50.0000 mg | ORAL_TABLET | Freq: Two times a day (BID) | ORAL | Status: DC
  Administered 2019-10-04 (×2): 50 mg via ORAL
  Filled 2019-10-03 (×2): qty 2

## 2019-10-03 MED ORDER — DEXTROSE-NACL 5-0.45 % IV SOLN: INTRAVENOUS | Status: DC

## 2019-10-03 MED ORDER — CEFAZOLIN SODIUM-DEXTROSE 2-4 GM/100ML-% IV SOLN
2.0000 g | Freq: Four times a day (QID) | INTRAVENOUS | Status: AC
  Administered 2019-10-03 – 2019-10-04 (×2): 2 g via INTRAVENOUS
  Filled 2019-10-03 (×2): qty 100

## 2019-10-03 MED ORDER — ALUM & MAG HYDROXIDE-SIMETH 200-200-20 MG/5ML PO SUSP: 30.0000 mL | ORAL | Status: DC | PRN

## 2019-10-03 MED ORDER — LEVOTHYROXINE SODIUM 100 MCG PO TABS
100.0000 ug | ORAL_TABLET | Freq: Every day | ORAL | Status: DC
  Administered 2019-10-04 – 2019-10-07 (×3): 100 ug via ORAL
  Filled 2019-10-03 (×4): qty 1

## 2019-10-03 MED ORDER — ONDANSETRON HCL 4 MG PO TABS: 4.0000 mg | ORAL_TABLET | Freq: Four times a day (QID) | ORAL | Status: DC | PRN

## 2019-10-03 MED ORDER — SENNA 8.6 MG PO TABS
1.0000 | ORAL_TABLET | Freq: Two times a day (BID) | ORAL | Status: DC
  Administered 2019-10-03 – 2019-10-07 (×7): 8.6 mg via ORAL
  Filled 2019-10-03 (×7): qty 1

## 2019-10-03 MED ORDER — BUPIVACAINE HCL (PF) 0.75 % IJ SOLN
INTRAMUSCULAR | Status: DC | PRN
  Administered 2019-10-03: 1.4 mL via INTRATHECAL

## 2019-10-03 SURGICAL SUPPLY — 49 items
APL SKNCLS STERI-STRIP NONHPOA (GAUZE/BANDAGES/DRESSINGS) ×1
BAG SPEC THK2 15X12 ZIP CLS (MISCELLANEOUS) ×1
BAG ZIPLOCK 12X15 (MISCELLANEOUS) ×3 IMPLANT
BENZOIN TINCTURE PRP APPL 2/3 (GAUZE/BANDAGES/DRESSINGS) ×3 IMPLANT
BIT DRILL CANN LG 4.3MM (BIT) IMPLANT
BLADE HEX COATED 2.75 (ELECTRODE) ×3 IMPLANT
BNDG COHESIVE 4X5 TAN STRL (GAUZE/BANDAGES/DRESSINGS) ×3 IMPLANT
BNDG GAUZE ELAST 4 BULKY (GAUZE/BANDAGES/DRESSINGS) ×3 IMPLANT
CLOSURE STERI-STRIP 1/2X4 (GAUZE/BANDAGES/DRESSINGS) ×1
CLOSURE WOUND 1/2 X4 (GAUZE/BANDAGES/DRESSINGS) ×1
CLSR STERI-STRIP ANTIMIC 1/2X4 (GAUZE/BANDAGES/DRESSINGS) ×1 IMPLANT
COVER PERINEAL POST (MISCELLANEOUS) ×3 IMPLANT
COVER SURGICAL LIGHT HANDLE (MISCELLANEOUS) ×3 IMPLANT
COVER WAND RF STERILE (DRAPES) IMPLANT
DRAPE STERI IOBAN 125X83 (DRAPES) ×3 IMPLANT
DRILL BIT CANN LG 4.3MM (BIT) ×3
DRSG MEPILEX BORDER 4X4 (GAUZE/BANDAGES/DRESSINGS) ×2 IMPLANT
DRSG MEPILEX BORDER 4X8 (GAUZE/BANDAGES/DRESSINGS) ×2 IMPLANT
DURAPREP 26ML APPLICATOR (WOUND CARE) ×3 IMPLANT
ELECT REM PT RETURN 15FT ADLT (MISCELLANEOUS) ×3 IMPLANT
EVACUATOR 1/8 PVC DRAIN (DRAIN) IMPLANT
GAUZE SPONGE 4X4 12PLY STRL (GAUZE/BANDAGES/DRESSINGS) ×3 IMPLANT
GAUZE XEROFORM 5X9 LF (GAUZE/BANDAGES/DRESSINGS) ×3 IMPLANT
GLOVE BIOGEL PI IND STRL 8 (GLOVE) ×1 IMPLANT
GLOVE BIOGEL PI INDICATOR 8 (GLOVE) ×2
GLOVE ECLIPSE 7.5 STRL STRAW (GLOVE) ×3 IMPLANT
GLOVE INDICATOR 8.0 STRL GRN (GLOVE) ×3 IMPLANT
GLOVE ORTHO TXT STRL SZ7.5 (GLOVE) ×6 IMPLANT
GLOVE SURG ORTHO 8.0 STRL STRW (GLOVE) ×3 IMPLANT
GOWN STRL REUS W/TWL LRG LVL3 (GOWN DISPOSABLE) ×3 IMPLANT
GUIDEPIN 3.2X17.5 THRD DISP (PIN) ×2 IMPLANT
KIT BASIN OR (CUSTOM PROCEDURE TRAY) ×3 IMPLANT
KIT TURNOVER KIT A (KITS) IMPLANT
NAIL HIP FRACT 130D 11X180 (Screw) ×2 IMPLANT
NS IRRIG 1000ML POUR BTL (IV SOLUTION) ×3 IMPLANT
PACK GENERAL/GYN (CUSTOM PROCEDURE TRAY) ×3 IMPLANT
PADDING CAST COTTON 6X4 STRL (CAST SUPPLIES) ×3 IMPLANT
PENCIL SMOKE EVACUATOR (MISCELLANEOUS) IMPLANT
PROTECTOR NERVE ULNAR (MISCELLANEOUS) ×9 IMPLANT
SCREW BONE CORTICAL 5.0X32 (Screw) ×2 IMPLANT
SCREW LAG HIP NAIL 10.5X95 (Screw) ×2 IMPLANT
STAPLER VISISTAT 35W (STAPLE) IMPLANT
STRIP CLOSURE SKIN 1/2X4 (GAUZE/BANDAGES/DRESSINGS) ×2 IMPLANT
SUT MNCRL AB 4-0 PS2 18 (SUTURE) ×3 IMPLANT
SUT VIC AB 0 CT1 27 (SUTURE)
SUT VIC AB 0 CT1 27XBRD ANTBC (SUTURE) IMPLANT
SUT VIC AB 3-0 SH 8-18 (SUTURE) ×3 IMPLANT
TOWEL OR 17X26 10 PK STRL BLUE (TOWEL DISPOSABLE) ×6 IMPLANT
WATER STERILE IRR 1000ML POUR (IV SOLUTION) ×3 IMPLANT

## 2019-10-03 NOTE — Op Note (Signed)
DATE OF SURGERY:  10/03/2019  TIME: 5:24 PM  PATIENT NAME:  Maria Clarke  AGE: 86 y.o.  PRE-OPERATIVE DIAGNOSIS: Left basicervical complex intertrochanteric hip fracture  POST-OPERATIVE DIAGNOSIS:  SAME  PROCEDURE:  INTRAMEDULLARY (IM) NAIL FEMORAL  SURGEON:  Johnny Bridge  ASSISTANT:  Merlene Pulling, PA-C, present and scrubbed throughout the case, critical for assistance with exposure, retraction, instrumentation, and closure.  OPERATIVE IMPLANTS: Biomet Affixus size short 11 femoral nail with a cephalomedullary lag screw for the femoral head size 32 a distal interlocking bolt.  UNIQUE ASPECTS OF THE CASE: Significant osteopenia.  ESTIMATED BLOOD LOSS: 100 mL.  PREOPERATIVE INDICATIONS:  Maria Clarke is a 86 y.o. year old who fell and suffered a hip fracture. She was brought into the ER and then admitted and optimized and then elected for surgical intervention.    The risks benefits and alternatives were discussed with the patient and the family including but not limited to the risks of nonoperative treatment, versus surgical intervention including infection, bleeding, nerve injury, malunion, nonunion, hardware prominence, hardware failure, need for hardware removal, blood clots, cardiopulmonary complications, morbidity, mortality, among others, and they were willing to proceed.    OPERATIVE PROCEDURE:  The patient was brought to the operating room and placed in the supine position.  Spinal anesthesia was administered, with a foley. She was placed on the fracture table.  Closed reduction was performed under C-arm guidance.  Time out was then performed after sterile prep and drape. She received preoperative antibiotics.  Incision was made proximal to the greater trochanter. A guidewire was placed in the appropriate position. Confirmation was made on AP and lateral views.   The above-named nail was opened. I opened the proximal femur with a reamer. I then placed the nail by hand  down. I did not need to ream the femur.  Once the nail was completely seated, I placed a guidepin into the femoral head into the center center position. I measured the length, and then reamed the lateral cortex and up into the head. I then placed the cephalomedullary screw. Slight compression was applied. Anatomic fixation achieved. Bone quality was mediocre.  I then secured the proximal interlocking bolt, and then used the jig to place a distal interlocking bolt with excellent fixation.  Final C-arm position pictures were taken, demonstrating anatomic alignment on both AP and lateral views, the entire length of the nail.  There was a nutrient foramen noted on the lateral view posteriorly, which did not represent a fracture line.  Anatomic reconstruction was achieved, and the wounds were irrigated copiously and closed with Vicryl followed by Steri-Strips and sterile gauze for the skin. The patient was awakened and returned to PACU in stable and satisfactory condition. There were no complications and the patient tolerated the procedure well.  She will be weightbearing as tolerated, and will be on Lovenox  for a period of 4 weeks after discharge.   Maria Clarke, M.D.

## 2019-10-03 NOTE — H&P (Signed)
History and Physical    Maria Clarke [id number] DOB: 03-24-1922 DOA: 10/03/2019  PCP: [name], NP   Patient coming from: Home  I have personally briefly reviewed patient's old medical records in [place]  Chief Complaint: S/p Fall  Most of the information was obtained from the ER notes.  Patient is unable to provide any history  HPI: Maria Clarke is a 86 y.o. female with medical history significant for dementia, hypertension, hypothyroidism and dyslipidemia who presented to the emergency room after a fall at home. Per  EMS patient was found lying on her left side.  No obvious deformities noted.  An x-ray of the left hip was done in the ER which showed subtle linear lucency extending through the lateral cortex of the left femoral neck, age indeterminate, but could reflect an acute nondisplaced fracture.  She had a CTscan of the left hip which showed acute comminuted intertrochanteric fracture of the left hip as above. I am unable to do review of systems due to patient's underlying dementia  ED Course: Patient presents after a mechanical fall.  She was noted to have significant tenderness to palpation of left hip and with range of motion.  She is also keeping the leg externally rotated.  Strong suspicion for a left hip fracture.  X-ray does not show definitive evidence of fracture.  I discussed the case with Dr. Mardelle Matte with orthopedic surgery.  He requested to get a  CT left hip, if this is inconclusive then she will require MRI.  Discussed this with patient and her son. 6:19 AM CT confirms left intertrochanteric fracture. Patient had no evidence of head injury. No other acute traumatic injury. Dr. Mardelle Matte was consulted for orthopedics.  Admit to hospitalist.   Review of Systems: As per HPI otherwise 10 point review of systems negative.    Past Medical History:  Diagnosis Date  . Atrial tachycardia ([place])    a. Noted 02/2013 on EKG.  . Carotid artery disease ([place])     Carotid duplex showed [id number] RICA and [id number] LICA  . Depression   . Hypercholesteremia   . Hypertension   . Hypothyroidism   . Stroke [facility])     Past Surgical History:  Procedure Laterality Date  . BIOPSY  07/01/2019   Procedure: BIOPSY;  Surgeon: Jackquline Denmark, MD;  Location: [facility] ENDOSCOPY;  Service: Endoscopy;;  . BREAST SURGERY Left    Benign Tumor- several years ago  . CESAREAN SECTION    . ESOPHAGOGASTRODUODENOSCOPY (EGD) WITH PROPOFOL N/A 06/19/2019   Procedure: ESOPHAGOGASTRODUODENOSCOPY (EGD) WITH PROPOFOL;  Surgeon: Jerene Bears, MD;  Location: [place];  Service: Gastroenterology;  Laterality: N/A;  . ESOPHAGOGASTRODUODENOSCOPY (EGD) WITH PROPOFOL N/A 06/21/2019   Procedure: ESOPHAGOGASTRODUODENOSCOPY (EGD) WITH PROPOFOL;  Surgeon: Thornton Park, MD;  Location: [place];  Service: Gastroenterology;  Laterality: N/A;  . ESOPHAGOGASTRODUODENOSCOPY (EGD) WITH PROPOFOL N/A 07/01/2019   Procedure: ESOPHAGOGASTRODUODENOSCOPY (EGD) WITH PROPOFOL;  Surgeon: Jackquline Denmark, MD;  Location: [facility] ENDOSCOPY;  Service: Endoscopy;  Laterality: N/A;  . EYE SURGERY Bilateral    cataract     reports that she has never smoked. She has never used smokeless tobacco. She reports that she does not drink alcohol or use drugs.  Allergies  Allergen Reactions  . Sulfonamide Derivatives Other (See Comments)    "ORAL S.E." (family unaware what time means)  . Mirtazapine Other (See Comments)    Caused much agitation, so it was STOPPED by family    Family History  Problem  Relation Age of Onset  . Diabetes Mother   . Heart disease Mother        After age 87  . Hyperlipidemia Mother   . Hypertension Mother   . Heart disease Sister        After age 22  . Asthma Sister   . Hyperlipidemia Sister   . Heart disease Brother        After age 32  . Diabetes Son   . Hypertension Son      Prior to Admission medications   Medication Sig Start Date End Date Taking? Authorizing Provider   atorvastatin (LIPITOR) 80 MG tablet Take 1 tablet (80 mg total) by mouth daily at 6 PM. 06/09/19   Frann Rider, NP  cyanocobalamin (,VITAMIN B-12,) 1000 MCG/ML injection Inject 1 mL (1,000 mcg total) into the skin every 30 (thirty) days. 12/23/18   Nafziger, Tommi Rumps, NP  donepezil (ARICEPT) 5 MG tablet Take 1 tablet (5 mg total) by mouth at bedtime. 08/14/19   Nafziger, Tommi Rumps, NP  escitalopram (LEXAPRO) 10 MG tablet Take 1 tablet (10 mg total) by mouth at bedtime. 09/17/19   Frann Rider, NP  levothyroxine (SYNTHROID) 100 MCG tablet Take 100 mcg by mouth daily before breakfast.    [provider]  metoprolol tartrate (LOPRESSOR) 50 MG tablet Take 1 tablet (50 mg total) by mouth 2 (two) times daily. TAKE 1 TABLET BY MOUTH TWICE A DAY 09/16/19 12/15/19  Nafziger, Tommi Rumps, NP  pantoprazole (PROTONIX) 40 MG tablet Take 1 tablet (40 mg total) by mouth 2 (two) times daily. 07/02/19 09/30/19  Cherylann Ratel A, DO  potassium chloride (KLOR-CON) 20 MEQ packet Take 40 mEq by mouth daily for 7 doses. 07/02/19 07/09/19  Jonnie Finner, DO    Physical Exam: Vitals:   10/03/19 0640 10/03/19 0644 10/03/19 0654 10/03/19 0657  BP: (!) 245/99 (!) 263/93 (!) 195/80 (!) 210/73  Pulse:  71 70 70  Resp:  12 12 10   Temp:      TempSrc:      SpO2:  95% 92% 96%  Weight:      Height:         Vitals:   10/03/19 0640 10/03/19 0644 10/03/19 0654 10/03/19 0657  BP: (!) 245/99 (!) 263/93 (!) 195/80 (!) 210/73  Pulse:  71 70 70  Resp:  12 12 10   Temp:      TempSrc:      SpO2:  95% 92% 96%  Weight:      Height:        Constitutional: NAD, alert and oriented only to person Eyes: PERRL, lids and conjunctivae normal ENMT: Mucous membranes are moist.  Neck: normal, supple, no masses, no thyromegaly Respiratory: clear to auscultation bilaterally, no wheezing, no crackles. Normal respiratory effort. No accessory muscle use.  Cardiovascular: Regular rate and rhythm, no murmurs / rubs / gallops. No extremity edema. 2+  pedal pulses. No carotid bruits.  Abdomen: no tenderness, no masses palpated. No hepatosplenomegaly. Bowel sounds positive.  Musculoskeletal: no clubbing / cyanosis.  Shortening of the left lower extremity Skin: no rashes, lesions, ulcers, ecchymoses on both forearms Neurologic: Weakness Psychiatric: Normal mood and affect.   Labs on Admission: I have personally reviewed following labs and imaging studies  CBC: Recent Labs  Lab 10/03/19 0518  WBC 7.7  NEUTROABS 5.7  HGB 9.7*  HCT 32.5*  MCV 82.3  PLT [id number]   Basic Metabolic Panel: Recent Labs  Lab 10/03/19 0518  NA 144  K 4.4  CL 111  CO2 26  GLUCOSE 134*  BUN 18  CREATININE 0.92  CALCIUM 8.9   GFR: Estimated Creatinine Clearance: 30.3 mL/min (by C-G formula based on SCr of 0.92 mg/dL). Liver Function Tests: No results for input(s): AST, ALT, ALKPHOS, BILITOT, PROT, ALBUMIN in the last 168 hours. No results for input(s): LIPASE, AMYLASE in the last 168 hours. No results for input(s): AMMONIA in the last 168 hours. Coagulation Profile: Recent Labs  Lab 10/03/19 0518  INR 1.0   Cardiac Enzymes: No results for input(s): CKTOTAL, CKMB, CKMBINDEX, TROPONINI in the last 168 hours. BNP (last 3 results) No results for input(s): PROBNP in the last 8760 hours. HbA1C: No results for input(s): HGBA1C in the last 72 hours. CBG: No results for input(s): GLUCAP in the last 168 hours. Lipid Profile: No results for input(s): CHOL, HDL, LDLCALC, TRIG, CHOLHDL, LDLDIRECT in the last 72 hours. Thyroid Function Tests: No results for input(s): TSH, T4TOTAL, FREET4, T3FREE, THYROIDAB in the last 72 hours. Anemia Panel: No results for input(s): VITAMINB12, FOLATE, FERRITIN, TIBC, IRON, RETICCTPCT in the last 72 hours. Urine analysis:    Component Value Date/Time   COLORURINE YELLOW 05/12/2019 1243   APPEARANCEUR CLEAR 05/12/2019 1243   LABSPEC 1.033 (H) 05/12/2019 1243   PHURINE 6.0 05/12/2019 1243   GLUCOSEU NEGATIVE  05/12/2019 1243   HGBUR SMALL (A) 05/12/2019 1243   HGBUR 1+ 04/13/2010 0934   BILIRUBINUR NEGATIVE 05/12/2019 1243   BILIRUBINUR N 12/05/2016 1213   KETONESUR NEGATIVE 05/12/2019 1243   PROTEINUR NEGATIVE 05/12/2019 1243   UROBILINOGEN 0.2 12/05/2016 1213   UROBILINOGEN 0.2 04/13/2010 0934   NITRITE NEGATIVE 05/12/2019 1243   LEUKOCYTESUR NEGATIVE 05/12/2019 1243    Radiological Exams on Admission: DG Chest 1 View  Result Date: 10/03/2019 CLINICAL DATA:  Initial evaluation for acute hip pain, fracture suspected. EXAM: CHEST  1 VIEW COMPARISON:  Prior radiograph from 03/05/2013. FINDINGS: Mild cardiomegaly. Mediastinal silhouette within normal limits. Aortic atherosclerosis. Lungs normally inflated. Mild diffuse interstitial congestion without overt pulmonary edema. Superimposed small left pleural effusion with mild left basilar atelectasis. No focal infiltrates. No pneumothorax. No acute osseous finding. Degenerative changes noted about the shoulders bilaterally. IMPRESSION: 1. Cardiomegaly with mild diffuse pulmonary interstitial congestion and small left pleural effusion. 2. Superimposed left basilar atelectasis. 3.  Aortic Atherosclerosis (ICD10-I70.0). Electronically Signed   By: Jeannine Boga M.D.   On: 10/03/2019 05:29   CT Hip Left Wo Contrast  Result Date: 10/03/2019 CLINICAL DATA:  Initial evaluation for acute left hip pain, fracture suspected. EXAM: CT OF THE LEFT HIP WITHOUT CONTRAST TECHNIQUE: Multidetector CT imaging of the left hip was performed according to the standard protocol. Multiplanar CT image reconstructions were also generated. COMPARISON:  None. FINDINGS: Bones/Joint/Cartilage There is an acute mildly comminuted intertrochanteric fracture of the proximal left femur. No significant displacement, impaction, or angulation. No subtrochanteric extension. Femoral head intact and remains normally situated. No associated acetabular fracture. Visualized pelvis and sacrum  intact. Moderate osteoarthritic changes present at the left hip. Degenerative change noted at the left ischial tuberosity. No discrete or worrisome osseous lesions. Ligaments Suboptimally assessed by CT. Muscles and Tendons Scattered calcifications noted at the hamstring insertion at the ischial tuberosity, suggesting chronic calcific tendinopathy. Similar changes seen at the gluteal insertion. Soft tissues Mild diffuse edema about the acute left hip fracture. No joint effusion. Moderate retained stool seen within the visualized distal colon. Scattered colonic diverticulosis without evidence for acute diverticulitis. Mild circumferential bladder wall thickening likely related incomplete distension.  Scattered vascular calcifications noted. IMPRESSION: Acute comminuted intertrochanteric fracture of the left hip as above. Electronically Signed   By: Jeannine Boga M.D.   On: 10/03/2019 05:36   DG Knee Complete 4 Views Left  Result Date: 10/03/2019 CLINICAL DATA:  Initial evaluation for acute pain status post fall. EXAM: LEFT KNEE - COMPLETE 4+ VIEW COMPARISON:  None available. FINDINGS: No acute fracture or dislocation. Probable small joint effusion noted within the suprapatellar recess. Moderate tricompartmental degenerative osteoarthrosis. Superimposed chondrocalcinosis. Underlying osteopenia. No visible soft tissue injury. Scattered vascular calcifications noted. IMPRESSION: 1. No acute osseous abnormality about the knee. 2. Moderate degenerative tricompartmental osteoarthrosis with associated small joint effusion. Electronically Signed   By: Jeannine Boga M.D.   On: 10/03/2019 03:33   DG Hip Unilat W or Wo Pelvis 2-3 Views Left  Result Date: 10/03/2019 CLINICAL DATA:  Initial evaluation for acute pain status post fall. EXAM: DG HIP (WITH OR WITHOUT PELVIS) 2-3V LEFT COMPARISON:  None. FINDINGS: On oblique view, there is question of a subtle lucency extending through the lateral cortex of the  left femoral neck, age indeterminate, but could reflect an acute nondisplaced fracture. No other acute fracture or dislocation. Femoral head remains normally position within the acetabulum. Femoral head height maintained. Bony pelvis intact. Advanced osteoarthritic changes about the hips bilaterally. Prominent degenerative spondylosis noted within the lower lumbar spine. No visible soft tissue injury. IMPRESSION: Subtle linear lucency extending through the lateral cortex of the left femoral neck, age indeterminate, but could reflect an acute nondisplaced fracture. Correlation with physical exam for possible pain at this location recommended. Further assessment with dedicated MRI could be performed for further evaluation as clinically warranted. Electronically Signed   By: Jeannine Boga M.D.   On: 10/03/2019 03:39    EKG: Independently reviewed.  Sinus rhythm Right bundle branch block  Assessment/Plan Principal Problem:   Intertrochanteric fracture of left femur (HCC) Active Problems:   Hypothyroidism   Depression, recurrent (HCC)   Hip fracture (HCC)   Hypertensive urgency   Anemia of chronic disease   Dementia without behavioral disturbance (HCC)     Status post fall with left intertrochanteric fracture of left femur Patient is status post fall which appears to be mechanical Immobilize left hip Pain control Orthopedic consult   Hypertensive urgency Most likely secondary to pain in left hip Optimize pain control Continue scheduled metoprolol IV hydralazine for SBP > 126mmHg   Anemia of chronic disease Stable   Hypothyroidism Continue Synthroid   Dementia Continue Lexapro and donepezil  DVT prophylaxis: SCD Code Status: Full code Family Communication: Attempted to reach patient's son Milaya Novosel over the phone. No response. Awaiting call back Disposition Plan: Back to previous home environment Consults called: Orthopedics    Jahziah Simonin MD [facility]     10/03/2019, 7:41 AM

## 2019-10-03 NOTE — Progress Notes (Signed)
New tegaderm dsg applied to Lt Elbow skin tear.

## 2019-10-03 NOTE — Consult Note (Addendum)
ORTHOPAEDIC CONSULTATION  REQUESTING PHYSICIAN: Collier Bullock, MD  Chief Complaint: Fall, left thigh pain  HPI: Maria Clarke is a 86 y.o. female with history of CAD, depression, hypertension, and dementia who presented at the ED early this morning after a fall. She came from home where she lives with her son. She was trying to stand up on her own and fell onto her left side. She did not hit her head or lose consciousness. X-ray of left hip showed possible non-displaced fracture, CT of left hip was then performed due to clinical suspicion of hip fracture which showed an acute comminuted intertrochanteric fracture of the left hip. This morning she complains of left thigh pain and left knee pain since the fall, worse with movement and better with rest. History limited due to dementia.    Past Medical History:  Diagnosis Date  . Atrial tachycardia ([place])    a. Noted 02/2013 on EKG.  . Carotid artery disease ([place])    Carotid duplex showed [id number] RICA and [id number] LICA  . Depression   . Hypercholesteremia   . Hypertension   . Hypothyroidism   . Stroke [facility])    Past Surgical History:  Procedure Laterality Date  . BIOPSY  07/01/2019   Procedure: BIOPSY;  Surgeon: Jackquline Denmark, MD;  Location: [facility] ENDOSCOPY;  Service: Endoscopy;;  . BREAST SURGERY Left    Benign Tumor- several years ago  . CESAREAN SECTION    . ESOPHAGOGASTRODUODENOSCOPY (EGD) WITH PROPOFOL N/A 06/19/2019   Procedure: ESOPHAGOGASTRODUODENOSCOPY (EGD) WITH PROPOFOL;  Surgeon: Jerene Bears, MD;  Location: [place];  Service: Gastroenterology;  Laterality: N/A;  . ESOPHAGOGASTRODUODENOSCOPY (EGD) WITH PROPOFOL N/A 06/21/2019   Procedure: ESOPHAGOGASTRODUODENOSCOPY (EGD) WITH PROPOFOL;  Surgeon: Thornton Park, MD;  Location: [place];  Service: Gastroenterology;  Laterality: N/A;  . ESOPHAGOGASTRODUODENOSCOPY (EGD) WITH PROPOFOL N/A 07/01/2019   Procedure: ESOPHAGOGASTRODUODENOSCOPY (EGD) WITH PROPOFOL;  Surgeon:  Jackquline Denmark, MD;  Location: [facility] ENDOSCOPY;  Service: Endoscopy;  Laterality: N/A;  . EYE SURGERY Bilateral    cataract   Social History   Socioeconomic History  . Marital status: Widowed    Spouse name: Not on file  . Number of children: Not on file  . Years of education: Not on file  . Highest education level: Not on file  Occupational History  . Occupation: Works part-time in Environmental health practitioner  . Smoking status: Never Smoker  . Smokeless tobacco: Never Used  Substance and Sexual Activity  . Alcohol use: No  . Drug use: No  . Sexual activity: Not on file  Other Topics Concern  . Not on file  Social History Narrative   Independent, family helps in her care.   Social Determinants of Health   Financial Resource Strain:   . Difficulty of Paying Living Expenses:   Food Insecurity:   . Worried About Charity fundraiser in the Last Year:   . Arboriculturist in the Last Year:   Transportation Needs:   . Film/video editor (Medical):   Marland Kitchen Lack of Transportation (Non-Medical):   Physical Activity:   . Days of Exercise per Week:   . Minutes of Exercise per Session:   Stress:   . Feeling of Stress :   Social Connections:   . Frequency of Communication with Friends and Family:   . Frequency of Social Gatherings with Friends and Family:   . Attends Religious Services:   . Active Member of Clubs or Organizations:   . Attends  Club or Organization Meetings:   Marland Kitchen Marital Status:    Family History  Problem Relation Age of Onset  . Diabetes Mother   . Heart disease Mother        After age 24  . Hyperlipidemia Mother   . Hypertension Mother   . Heart disease Sister        After age 51  . Asthma Sister   . Hyperlipidemia Sister   . Heart disease Brother        After age 84  . Diabetes Son   . Hypertension Son    Allergies  Allergen Reactions  . Sulfonamide Derivatives Other (See Comments)    "ORAL S.E." (family unaware what time means)  . Mirtazapine Other  (See Comments)    Caused much agitation, so it was STOPPED by family     Positive ROS: All other systems have been reviewed and were otherwise negative with the exception of those mentioned in the HPI and as above.  Physical Exam: General: Alert, no acute distress. Patient laying supine in bed.  Cardiovascular: No pedal edema. Respiratory: No cyanosis, no use of accessory musculature. Clear to auscultation bilaterally.  GI: No organomegaly, abdomen is soft and non-tender Skin: Large skin tear near lateral epicondyle of left elbow. No skin abnormalities notes at left hip or groin.  Neurologic: Sensation intact distally Psychiatric: Oriented to self only.  Lymphatic: No axillary or cervical lymphadenopathy  MUSCULOSKELETAL: Left lower extremity externally rotated, pain illicited with internally and external rotation of left hip. Mild TTP to left knee but patient unable to pinpoint pain. EHL and FHL intact. Able to move all toes of left foot without pain. 2+DP pulse. Distal sensation appears intact when questioning patient. No edema noted at left knee, ankle, or foot. No ecchymosis in left thigh or groin.   RLE - No pain with passive internal and external rotation of left hip. Passive movement of knee. Full ROM at right ankle, able to move all toes. Distal sensation intact. 2+ DP pulse.   Assessment/Plan: Principal Problem:   Intertrochanteric fracture of left femur (HCC) Active Problems:   Hypothyroidism   Depression, recurrent (HCC)   Hip fracture (HCC)   Hypertensive urgency   Anemia of chronic disease   Dementia without behavioral disturbance (HCC)   Closed comminuted intertrochanteric fracture of proximal end of left femur (HCC)  Left intertrochanteric femur fracture: - plan left IM nail with Dr. Mardelle Matte later this afternoon - diet NPO - NWB LLE  Ventura Bruns, PA-C   10/03/2019 8:28 AM

## 2019-10-03 NOTE — Anesthesia Procedure Notes (Signed)
Spinal  Patient location during procedure: OR Start time: 10/03/2019 4:01 PM End time: 10/03/2019 4:11 PM Staffing Performed: anesthesiologist  Anesthesiologist: Duane Boston, MD Preanesthetic Checklist Completed: patient identified, IV checked, risks and benefits discussed, surgical consent, monitors and equipment checked, pre-op evaluation and timeout performed Spinal Block Patient position: sitting Prep: DuraPrep Patient monitoring: cardiac monitor, continuous pulse ox and blood pressure Approach: midline Location: L2-3 Injection technique: single-shot Needle Needle type: Pencan  Needle gauge: 24 G Needle length: 9 cm Additional Notes Functioning IV was confirmed and monitors were applied. Sterile prep and drape, including hand hygiene and sterile gloves were used. The patient was positioned and the spine was prepped. The skin was anesthetized with lidocaine.  Free flow of clear CSF was obtained prior to injecting local anesthetic into the CSF.  The spinal needle aspirated freely following injection.  The needle was carefully withdrawn.  The patient tolerated the procedure well.

## 2019-10-03 NOTE — ED Provider Notes (Signed)
Elevated BP is noted.  Patient is mostly on Lopressor. BP (!) 250/89   Pulse 77   Temp 98.4 F (36.9 C) (Oral)   Resp 15   Ht 1.6 m (5\' 3" )   Wt 56.7 kg   SpO2 96%   BMI 22.14 kg/m  IV labetalol has been ordered. This is likely due to hypertension plus her acute pain    Ripley Fraise, MD 10/03/19 [phone]

## 2019-10-03 NOTE — ED Notes (Signed)
Pt's admission meds not verified at this time. Pt is holding in the ED until a bed upstairs is ready. Pharmacy is awaiting a phone call back from family for verification of meds before they will verify her meds. Will give pt meds once this is done.

## 2019-10-03 NOTE — ED Triage Notes (Addendum)
BIB form home hx of dementia. Per EMS pt fall onto left side. No noticeable deformity. No known blood thinners. Denies hitting head no bumps noted.  Complaining of left knee pain.  Small abrasion to left elbow wrapped by EMS.   108/62 57 98%

## 2019-10-03 NOTE — Progress Notes (Signed)
Pt arrived from ED to rm 1517. VS are stable. Will continue to monitor.

## 2019-10-03 NOTE — Transfer of Care (Signed)
Immediate Anesthesia Transfer of Care Note  Patient: Maria Clarke  Procedure(s) Performed: INTRAMEDULLARY (IM) NAIL FEMORAL (Left Hip)  Patient Location: PACU  Anesthesia Type:Spinal  Level of Consciousness: awake  Airway & Oxygen Therapy: Patient Spontanous Breathing and Patient connected to face mask  Post-op Assessment: Report given to RN and Post -op Vital signs reviewed and stable  Post vital signs: Reviewed and stable  Last Vitals:  Vitals Value Taken Time  BP 181/73 10/03/19 1752  Temp 36.5 C 10/03/19 1740  Pulse 53 10/03/19 1744  Resp 14 10/03/19 1754  SpO2 99 % 10/03/19 1744  Vitals shown include unvalidated device data.  Last Pain:  Vitals:   10/03/19 1443  TempSrc: Oral  PainSc:          Complications: No apparent anesthesia complications

## 2019-10-03 NOTE — ED Notes (Signed)
Pt was on 2L of O2 for decreasing sats after pain meds per previous shift RN. Pt had taken O2 off and was 90% on RA upon this RN's arrival to room. Pt placed back on O2.

## 2019-10-03 NOTE — ED Notes (Signed)
Patient transported to x-ray. ?

## 2019-10-03 NOTE — ED Notes (Signed)
PT placed on 2 L [place] after O2 saturation decreaesing to 87% on RA after fentanyl

## 2019-10-03 NOTE — Progress Notes (Signed)
Initial Nutrition Assessment  RD working remotely.  DOCUMENTATION CODES:   Not applicable  INTERVENTION:   Once diet is advanced, add:  -Magic cup BID with meals, each supplement provides 290 kcal and 9 grams of protein -MVI with minerals daily -Ensure Enlive po BID, each supplement provides 350 kcal and 20 grams of protein  NUTRITION DIAGNOSIS:   Increased nutrient needs related to post-op healing as evidenced by estimated needs.  GOAL:   Patient will meet greater than or equal to 90% of their needs  MONITOR:   PO intake, Supplement acceptance, Diet advancement, Labs, Weight trends, Skin, I & O's  REASON FOR ASSESSMENT:   Consult Hip fracture protocol  ASSESSMENT:   Maria Clarke is a 86 y.o. female with medical history significant for dementia, hypertension, hypothyroidism and dyslipidemia who presented to the emergency room after a fall at home. Per  EMS patient was found lying on her left side.  No obvious deformities noted.  An x-ray of the left hip was done in the ER which showed subtle linear lucency extending through the lateral cortex of the left femoral neck, age indeterminate, but could reflect an acute nondisplaced fracture.  She had a CT scan of the left hip which showed acute comminuted intertrochanteric fracture of the left hip as above.I am unable to do review of systems due to patient's underlying dementia  Pt admitted with hip fracture s/p fall.   Attempted to speak with pt via phone, however, no answer. Per chart review, pt with dementia and unable to provide history.   Per orthopedics notes, plan for lt IM nailing today. She is NPO for procedure.   Reviewed wt hx; noted pt has experienced a 9.3% wt loss over the past year. While this is not significant for time frame, it is concerning given pt's advanced age and dementia.   Medications reviewed and include dextrose 5%-0.45% sodium chloride infusion @ 50 ml/hr.   Labs reviewed.   Diet Order:    Diet Order            Diet NPO time specified  Diet effective now              EDUCATION NEEDS:   No education needs have been identified at this time  Skin:  Skin Assessment: Reviewed RN Assessment  Last BM:  Unknown  Height:   Ht Readings from Last 1 Encounters:  10/03/19 5\' 3"  (1.6 m)    Weight:   Wt Readings from Last 1 Encounters:  10/03/19 56.7 kg    Ideal Body Weight:  52.3 kg  BMI:  Body mass index is 22.14 kg/m.  Estimated Nutritional Needs:   Kcal:  1400-1600  Protein:  60-75 grams  Fluid:  > 1.4 L    Loistine Chance, RD, LDN, [place] Registered Dietitian II Certified Diabetes Care and Education Specialist Please refer to [facility] for RD and/or RD on-call/weekend/after hours pager

## 2019-10-03 NOTE — ED Notes (Signed)
MD made aware of PT manual BP of 245/99

## 2019-10-03 NOTE — Progress Notes (Signed)
May insert Rt foot IV and leave in place on transfer to floor-per Dr Tobias Alexander

## 2019-10-03 NOTE — ED Provider Notes (Signed)
[place] DEPT Provider Note   CSN: [id number] Arrival date & time: 10/03/19  0143     History Chief Complaint - fall, left knee pain  Level 5 caveat due to dementia  Maria Clarke is a 86 y.o. female.  The history is provided by the patient and a relative. The history is limited by the condition of the patient.  Fall This is a new problem. The problem occurs constantly. Pertinent negatives include no chest pain, no abdominal pain and no shortness of breath. The symptoms are aggravated by walking. The symptoms are relieved by rest.  Patient with history of CAD, depression, hypertension, dementia presents with fall.  Patient was reportedly trying to stand up on her own without waiting for assistance and she fell on her left side.  She has an abrasion to her left elbow and has pain in her left thigh and knee.  No head injury.  No LOC.  She is not on anticoagulants.  No neck or back pain Patient is accompanied by her son who who provides most of the history     Past Medical History:  Diagnosis Date  . Atrial tachycardia ([place])    a. Noted 02/2013 on EKG.  . Carotid artery disease ([place])    Carotid duplex showed [id number] RICA and [id number] LICA  . Depression   . Hypercholesteremia   . Hypertension   . Hypothyroidism   . Stroke [facility])     Patient Active Problem List   Diagnosis Date Noted  . Gastric ulcer without hemorrhage or perforation   . Acute gastric ulcer with hemorrhage   . Acute GI bleeding 06/18/2019  . Acute blood loss anemia 06/18/2019  . CVA (cerebral vascular accident) ([place]) 05/14/2019  . TIA (transient ischemic attack) 05/12/2019  . Cognitive impairment 10/29/2018  . Shingles outbreak 05/25/2015  . Carotid stenosis 04/20/2014  . Memory loss, short term 08/13/2013  . Occlusion and stenosis of carotid artery without mention of cerebral infarction 04/20/2013  . Carotid bruit 03/05/2013  . Hearing loss of both ears 11/10/2012  . EDEMA-  LOCALIZED 12/15/2009  . Hypothyroidism 12/11/2007  . Depression, recurrent ([place]) 12/11/2007  . Essential hypertension 12/11/2007  . HEMATURIA 02/14/2007    Past Surgical History:  Procedure Laterality Date  . BIOPSY  07/01/2019   Procedure: BIOPSY;  Surgeon: Jackquline Denmark, MD;  Location: [facility] ENDOSCOPY;  Service: Endoscopy;;  . BREAST SURGERY Left    Benign Tumor- several years ago  . CESAREAN SECTION    . ESOPHAGOGASTRODUODENOSCOPY (EGD) WITH PROPOFOL N/A 06/19/2019   Procedure: ESOPHAGOGASTRODUODENOSCOPY (EGD) WITH PROPOFOL;  Surgeon: Jerene Bears, MD;  Location: [facility];  Service: Gastroenterology;  Laterality: N/A;  . ESOPHAGOGASTRODUODENOSCOPY (EGD) WITH PROPOFOL N/A 06/21/2019   Procedure: ESOPHAGOGASTRODUODENOSCOPY (EGD) WITH PROPOFOL;  Surgeon: Thornton Park, MD;  Location: [facility];  Service: Gastroenterology;  Laterality: N/A;  . ESOPHAGOGASTRODUODENOSCOPY (EGD) WITH PROPOFOL N/A 07/01/2019   Procedure: ESOPHAGOGASTRODUODENOSCOPY (EGD) WITH PROPOFOL;  Surgeon: Jackquline Denmark, MD;  Location: [facility] ENDOSCOPY;  Service: Endoscopy;  Laterality: N/A;  . EYE SURGERY Bilateral    cataract     OB History   No obstetric history on file.     Family History  Problem Relation Age of Onset  . Diabetes Mother   . Heart disease Mother        After age 82  . Hyperlipidemia Mother   . Hypertension Mother   . Heart disease Sister        After age 55  .  Asthma Sister   . Hyperlipidemia Sister   . Heart disease Brother        After age 8  . Diabetes Son   . Hypertension Son     Social History   Tobacco Use  . Smoking status: Never Smoker  . Smokeless tobacco: Never Used  Substance Use Topics  . Alcohol use: No  . Drug use: No    Home Medications Prior to Admission medications   Medication Sig Start Date End Date Taking? Authorizing Provider  atorvastatin (LIPITOR) 80 MG tablet Take 1 tablet (80 mg total) by mouth daily at 6 PM. 06/09/19   Frann Rider, NP   cyanocobalamin (,VITAMIN B-12,) 1000 MCG/ML injection Inject 1 mL (1,000 mcg total) into the skin every 30 (thirty) days. 12/23/18   Nafziger, Tommi Rumps, NP  donepezil (ARICEPT) 5 MG tablet Take 1 tablet (5 mg total) by mouth at bedtime. 08/14/19   Nafziger, Tommi Rumps, NP  escitalopram (LEXAPRO) 10 MG tablet Take 1 tablet (10 mg total) by mouth at bedtime. 09/17/19   Frann Rider, NP  levothyroxine (SYNTHROID) 100 MCG tablet Take 100 mcg by mouth daily before breakfast.    [provider]  metoprolol tartrate (LOPRESSOR) 50 MG tablet Take 1 tablet (50 mg total) by mouth 2 (two) times daily. TAKE 1 TABLET BY MOUTH TWICE A DAY 09/16/19 12/15/19  Nafziger, Tommi Rumps, NP  pantoprazole (PROTONIX) 40 MG tablet Take 1 tablet (40 mg total) by mouth 2 (two) times daily. 07/02/19 09/30/19  Cherylann Ratel A, DO  potassium chloride (KLOR-CON) 20 MEQ packet Take 40 mEq by mouth daily for 7 doses. 07/02/19 07/09/19  Cherylann Ratel A, DO    Allergies    Sulfonamide derivatives and Mirtazapine  Review of Systems   Review of Systems  Unable to perform ROS: Dementia  Respiratory: Negative for shortness of breath.   Cardiovascular: Negative for chest pain.  Gastrointestinal: Negative for abdominal pain.    Physical Exam Updated Vital Signs BP (!) 217/91 (BP Location: Right Wrist)   Pulse 69   Temp 98.4 F (36.9 C) (Oral)   Resp 18   Ht 1.6 m (5\' 3" )   Wt 56.7 kg   SpO2 98%   BMI 22.14 kg/m   Physical Exam CONSTITUTIONAL: Elderly, no acute distress HEAD: Normocephalic/atraumatic EYES: EOMI ENMT: Mucous membranes moist, no visible trauma NECK: supple no meningeal signs SPINE/BACK: No cervical spine tenderness, no thoracic/lumbar tenderness, no obvious bruising CV: S1/S2 noted LUNGS: Lungs are clear to auscultation bilaterally, no apparent distress Chest-no tenderness or bruising ABDOMEN: soft, nontender NEURO: Pt is awake/alert, pleasantly confused moves all extremitiesx4.  No facial droop.   EXTREMITIES:  pulses normal/equal, full ROM, pelvis stable.  Tenderness to palpation of left thigh and left knee.  No deformities.  Distal pulses intact.  Abrasion noted to left elbow, but no tenderness with range of motion or palpation. All other extremities/joints palpated/ranged and nontender SKIN: warm, color normal PSYCH: no abnormalities of mood noted, alert and oriented to situation  ED Results / Procedures / Treatments   Labs (all labs ordered are listed, but only abnormal results are displayed) Labs Reviewed  BASIC METABOLIC PANEL - Abnormal; Notable for the following components:      Result Value   Glucose, Bld 134 (*)    GFR calc non Af Amer 53 (*)    All other components within normal limits  CBC WITH DIFFERENTIAL/PLATELET - Abnormal; Notable for the following components:   Hemoglobin 9.7 (*)    HCT  32.5 (*)    MCH 24.6 (*)    MCHC 29.8 (*)    All other components within normal limits  SARS CORONAVIRUS 2 (TAT 6-24 HRS)  PROTIME-INR  TYPE AND SCREEN    EKG EKG Interpretation  Date/Time:  Saturday October 03 2019 04:32:09 EDT Ventricular Rate:  69 PR Interval:    QRS Duration: 137 QT Interval:  494 QTC Calculation: 530 R Axis:   45 Text Interpretation: Sinus rhythm Probable left atrial enlargement Right bundle branch block Baseline wander in lead(s) I III aVL No significant change since last tracing Confirmed by Ripley Fraise [phone]) on 10/03/2019 4:39:19 AM   Radiology DG Chest 1 View  Result Date: 10/03/2019 CLINICAL DATA:  Initial evaluation for acute hip pain, fracture suspected. EXAM: CHEST  1 VIEW COMPARISON:  Prior radiograph from 03/05/2013. FINDINGS: Mild cardiomegaly. Mediastinal silhouette within normal limits. Aortic atherosclerosis. Lungs normally inflated. Mild diffuse interstitial congestion without overt pulmonary edema. Superimposed small left pleural effusion with mild left basilar atelectasis. No focal infiltrates. No pneumothorax. No acute osseous finding.  Degenerative changes noted about the shoulders bilaterally. IMPRESSION: 1. Cardiomegaly with mild diffuse pulmonary interstitial congestion and small left pleural effusion. 2. Superimposed left basilar atelectasis. 3.  Aortic Atherosclerosis (ICD10-I70.0). Electronically Signed   By: Jeannine Boga M.D.   On: 10/03/2019 05:29   CT Hip Left Wo Contrast  Result Date: 10/03/2019 CLINICAL DATA:  Initial evaluation for acute left hip pain, fracture suspected. EXAM: CT OF THE LEFT HIP WITHOUT CONTRAST TECHNIQUE: Multidetector CT imaging of the left hip was performed according to the standard protocol. Multiplanar CT image reconstructions were also generated. COMPARISON:  None. FINDINGS: Bones/Joint/Cartilage There is an acute mildly comminuted intertrochanteric fracture of the proximal left femur. No significant displacement, impaction, or angulation. No subtrochanteric extension. Femoral head intact and remains normally situated. No associated acetabular fracture. Visualized pelvis and sacrum intact. Moderate osteoarthritic changes present at the left hip. Degenerative change noted at the left ischial tuberosity. No discrete or worrisome osseous lesions. Ligaments Suboptimally assessed by CT. Muscles and Tendons Scattered calcifications noted at the hamstring insertion at the ischial tuberosity, suggesting chronic calcific tendinopathy. Similar changes seen at the gluteal insertion. Soft tissues Mild diffuse edema about the acute left hip fracture. No joint effusion. Moderate retained stool seen within the visualized distal colon. Scattered colonic diverticulosis without evidence for acute diverticulitis. Mild circumferential bladder wall thickening likely related incomplete distension. Scattered vascular calcifications noted. IMPRESSION: Acute comminuted intertrochanteric fracture of the left hip as above. Electronically Signed   By: Jeannine Boga M.D.   On: 10/03/2019 05:36   DG Knee Complete 4 Views  Left  Result Date: 10/03/2019 CLINICAL DATA:  Initial evaluation for acute pain status post fall. EXAM: LEFT KNEE - COMPLETE 4+ VIEW COMPARISON:  None available. FINDINGS: No acute fracture or dislocation. Probable small joint effusion noted within the suprapatellar recess. Moderate tricompartmental degenerative osteoarthrosis. Superimposed chondrocalcinosis. Underlying osteopenia. No visible soft tissue injury. Scattered vascular calcifications noted. IMPRESSION: 1. No acute osseous abnormality about the knee. 2. Moderate degenerative tricompartmental osteoarthrosis with associated small joint effusion. Electronically Signed   By: Jeannine Boga M.D.   On: 10/03/2019 03:33   DG Hip Unilat W or Wo Pelvis 2-3 Views Left  Result Date: 10/03/2019 CLINICAL DATA:  Initial evaluation for acute pain status post fall. EXAM: DG HIP (WITH OR WITHOUT PELVIS) 2-3V LEFT COMPARISON:  None. FINDINGS: On oblique view, there is question of a subtle lucency extending through the lateral cortex  of the left femoral neck, age indeterminate, but could reflect an acute nondisplaced fracture. No other acute fracture or dislocation. Femoral head remains normally position within the acetabulum. Femoral head height maintained. Bony pelvis intact. Advanced osteoarthritic changes about the hips bilaterally. Prominent degenerative spondylosis noted within the lower lumbar spine. No visible soft tissue injury. IMPRESSION: Subtle linear lucency extending through the lateral cortex of the left femoral neck, age indeterminate, but could reflect an acute nondisplaced fracture. Correlation with physical exam for possible pain at this location recommended. Further assessment with dedicated MRI could be performed for further evaluation as clinically warranted. Electronically Signed   By: Jeannine Boga M.D.   On: 10/03/2019 03:39    Procedures Procedures  Medications Ordered in ED Medications  HYDROcodone-acetaminophen  (NORCO/VICODIN) 5-325 MG per tablet 1 tablet (has no administration in time range)    ED Course  I have reviewed the triage vital signs and the nursing notes.  Pertinent labs & imaging results that were available during my care of the patient were reviewed by me and considered in my medical decision making (see chart for details).    MDM Rules/Calculators/A&P                      4:05 AM Patient presents after mechanical fall.  She has significant tenderness to palpation of left hip and with range of motion.  She is also keeping the leg externally rotated.  Strong suspicion for a left hip fracture.  X-ray does not show definitive evidence of fracture.  I discussed the case with Dr. Mardelle Matte with orthopedic surgery.  He request to start with a CT left hip, if this is inconclusive then she will require MRI.  Discussed this with patient and her son. 6:19 AM CT confirms left intertrochanteric fracture. Patient had no evidence of head injury. No other acute traumatic injury. Dr. Mardelle Matte was consulted for orthopedics.  Admit to hospitalist. I attempted to call son back about the findings, but no answer 6:34 AM Patient's pain has increased. Discussed with Dr. Marlowe Sax for admission Final Clinical Impression(s) / ED Diagnoses Final diagnoses:  Closed fracture of left hip, initial encounter ([place])  Abrasion of left elbow, initial encounter    Rx / DC Orders ED Discharge Orders    None       Ripley Fraise, MD 10/03/19 [phone]

## 2019-10-03 NOTE — Anesthesia Preprocedure Evaluation (Addendum)
Anesthesia Evaluation  Patient identified by MRN, date of birth, ID band Patient awake    Reviewed: Allergy & Precautions, H&P , NPO status , Patient's Chart, lab work & pertinent test results, reviewed documented beta blocker date and time   History of Anesthesia Complications Negative for: history of anesthetic complications  Airway Mallampati: II  TM Distance: >3 FB Neck ROM: Full    Dental no notable dental hx. (+) Dental Advisory Given   Pulmonary neg pulmonary ROS,    Pulmonary exam normal        Cardiovascular hypertension, Pt. on medications and Pt. on home beta blockers  Rhythm:Regular Rate:Normal + Systolic murmurs Echo [id number] IMPRESSIONS    1. Left ventricular ejection fraction, by visual estimation, is 65 to  70%. The left ventricle has normal function. Left ventricular septal wall  thickness was moderately increased. Moderately increased left ventricular  posterior wall thickness. There is  no left ventricular hypertrophy.  2. Left ventricular diastolic parameters are consistent with Grade I  diastolic dysfunction (impaired relaxation).  3. Global right ventricle has normal systolic function.The right  ventricular size is normal. No increase in right ventricular wall  thickness.  4. Left atrial size was normal.  5. Right atrial size was normal.  6. Severe aortic valve annular calcification.  7. The mitral valve is normal in structure. No evidence of mitral valve  regurgitation. No evidence of mitral stenosis.  8. The tricuspid valve is normal in structure. Tricuspid valve  regurgitation is not demonstrate  9. The aortic valve is tricuspid. There is Moderate thickening of the  aortic valve. There is Severe calcifcation of the aortic valve. Aortic  valve regurgitation is mild to moderate. Mild to moderate aortic valve  stenosis. Aortic regurgitation PHT  measures 365 msec. Aortic valve mean gradient  measures 14.2 mmHg. Aortic  valve peak gradient measures 25.2 mmHg. Aortic valve area, by VTI measures  1.36 cm.  10. The pulmonic valve was normal in structure. Pulmonic valve  regurgitation is trivial.  11. The inferior vena cava is normal in size with greater than 50%  respiratory variability, suggesting right atrial pressure of 3 mmHg.    Neuro/Psych Depression negative neurological ROS     GI/Hepatic Neg liver ROS, PUD,   Endo/Other  Hypothyroidism   Renal/GU negative Renal ROS  negative genitourinary   Musculoskeletal   Abdominal   Peds  Hematology  (+) Blood dyscrasia, anemia ,   Anesthesia Other Findings   Reproductive/Obstetrics negative OB ROS                            Anesthesia Physical  Anesthesia Plan  ASA: III  Anesthesia Plan: Spinal   Post-op Pain Management:    Induction:   PONV Risk Score and Plan: 2 and Treatment may vary due to age or medical condition  Airway Management Planned: Natural Airway  Additional Equipment:   Intra-op Plan:   Post-operative Plan:   Informed Consent: I have reviewed the patients History and Physical, chart, labs and discussed the procedure including the risks, benefits and alternatives for the proposed anesthesia with the patient or authorized representative who has indicated his/her understanding and acceptance.    Discussed DNR with patient and Suspend DNR.   Dental advisory given  Plan Discussed with: Anesthesiologist, CRNA and Surgeon  Anesthesia Plan Comments:       Anesthesia Quick Evaluation

## 2019-10-03 NOTE — Progress Notes (Signed)
Pt transferred to OR for surgery.

## 2019-10-03 NOTE — ED Notes (Signed)
Report give to Mollie Germany, Therapist, sports. They are waiting on a low bed for pt before she can be transmitted. They report they will let us know whenever the bed is available.

## 2019-10-04 LAB — BASIC METABOLIC PANEL
Anion gap: 9 (ref 5–15)
BUN: 17 mg/dL (ref 8–23)
CO2: 24 mmol/L (ref 22–32)
Calcium: 8.5 mg/dL — ABNORMAL LOW (ref 8.9–10.3)
Chloride: 106 mmol/L (ref 98–111)
Creatinine, Ser: 0.95 mg/dL (ref 0.44–1.00)
GFR calc Af Amer: 59 mL/min — ABNORMAL LOW (ref >60)
GFR calc non Af Amer: 51 mL/min — ABNORMAL LOW (ref >60)
Glucose, Bld: 166 mg/dL — ABNORMAL HIGH (ref 70–99)
Potassium: 4.2 mmol/L (ref 3.5–5.1)
Sodium: 139 mmol/L (ref 135–145)

## 2019-10-04 LAB — CBC
HCT: 27.6 % — ABNORMAL LOW (ref 36.0–46.0)
Hemoglobin: 8 g/dL — ABNORMAL LOW (ref 12.0–15.0)
MCH: 24 pg — ABNORMAL LOW (ref 26.0–34.0)
MCHC: 29 g/dL — ABNORMAL LOW (ref 30.0–36.0)
MCV: 82.6 fL (ref 80.0–100.0)
Platelets: 153 10*3/uL (ref 150–400)
RBC: 3.34 MIL/uL — ABNORMAL LOW (ref 3.87–5.11)
RDW: 15.2 % (ref 11.5–15.5)
WBC: 7.7 10*3/uL (ref 4.0–10.5)
nRBC: 0 % (ref 0.0–0.2)

## 2019-10-04 MED ORDER — ACETAMINOPHEN 325 MG PO TABS
650.0000 mg | ORAL_TABLET | Freq: Three times a day (TID) | ORAL | Status: DC
  Administered 2019-10-04 – 2019-10-07 (×11): 650 mg via ORAL
  Filled 2019-10-04 (×11): qty 2

## 2019-10-04 NOTE — Progress Notes (Signed)
PROGRESS NOTE    Maria Clarke  [id number] DOB: [birth year] DOA: 10/03/2019 PCP: [name], NP   Brief Narrative: 86 y.o. female with medical history significant for dementia, hypertension, hypothyroidism and dyslipidemia who presented to the emergency room after a fall at home. Per  EMS patient was found lying on her left side.  No obvious deformities noted.  An x-ray of the left hip was done in the ER which showed subtle linear lucency extending through the lateral cortex of the left femoral neck, age indeterminate, but could reflect an acute nondisplaced fracture.  She had a CTscan of the left hip which showed acute comminuted intertrochanteric fracture of the left hip as above. I am unable to do review of systems due to patient's underlying dementia  ED Course: Patient presents after a mechanical fall. She was noted to have significant tenderness to palpation of left hip and with range of motion. She is also keeping the leg externally rotated. Strong suspicion for a left hip fracture. X-ray does not show definitive evidence of fracture. I discussed the case with Dr. Mardelle Matte with orthopedic surgery. He requested to get a  CT left hip, if this is inconclusive then she will require MRI. Discussed this with patient and her son. 6:19 AM CT confirms left intertrochanteric fracture. Patient had no evidence of head injury. No other acute traumatic injury. Dr. Mardelle Matte was consulted for orthopedics. Admit to hospitalist.  Assessment & Plan:   Principal Problem:   Intertrochanteric fracture of left femur (HCC) Active Problems:   Hypothyroidism   Depression, recurrent (HCC)   Hip fracture (HCC)   Hypertensive urgency   Anemia of chronic disease   Dementia without behavioral disturbance (HCC)   Closed comminuted intertrochanteric fracture of proximal end of left femur (HCC)    1 status post mechanical fall with left intertrochanteric fracture-status post IM nailing  10/03/2019. Pain control with Tylenol PT consult OT consult Bowel regime Colace and senna DVT prophylaxis with Lovenox  2 essential hypertension with hypertensive urgency on admission blood pressure 135/67 continue metoprolol 50 mg twice a day  3 anemia of chronic disease hemoglobin hemoglobin 9.9 on admission down to 8 today.  Likely some postop component.  Monitor in a.m.  4 hypothyroidism continue Synthroid  5 dementia on Aricept  6 depression on Lexapro  7 hyperlipidemia on Lipitor    Nutrition Problem: Increased nutrient needs Etiology: post-op healing     Signs/Symptoms: estimated needs    Interventions: Ensure Enlive (each supplement provides 350kcal and 20 grams of protein), Magic cup, MVI  Estimated body mass index is 22.14 kg/m as calculated from the following:   Height as of this encounter: 5\' 3"  (1.6 m).   Weight as of this encounter: 56.7 kg.  DVT prophylaxis: Lovenox  code Status: DO NOT RESUSCITATE  family Communication: Disposition Plan:  Consultants:   ortho  Procedures:left hip fracture status post IM nailing Antimicrobials: None Subjective: She is resting in bed in no acute distress Was confused when I woke her up from sleep Did not know where she was and why she is in the hospital  Objective: Vitals:   10/03/19 1900 10/03/19 2128 10/04/19 0122 10/04/19 0509  BP: (!) 151/43 (!) 110/43 (!) 101/45 135/67  Pulse: 71 77 71 84  Resp: 16 19 18 16   Temp: 97.6 F (36.4 C) 98 F (36.7 C) 98.2 F (36.8 C) 98.3 F (36.8 C)  TempSrc: Oral Oral Oral Oral  SpO2: 93% 100% 99% 100%  Weight:  Height:        Intake/Output Summary (Last 24 hours) at 10/04/2019 0852 Last data filed at 10/04/2019 0509 Gross per 24 hour  Intake 1076.06 ml  Output 550 ml  Net 526.06 ml   Filed Weights   10/03/19 0153  Weight: 56.7 kg    Examination: Foley in place  General exam: Appears calm and comfortable  Respiratory system: Clear to auscultation.  Respiratory effort normal. Cardiovascular system: S1 & S2 heard, RRR. No JVD, murmurs, rubs, gallops or clicks. No pedal edema. Gastrointestinal system: Abdomen is nondistended, soft and nontender. No organomegaly or masses felt. Normal bowel sounds heard. Central nervous system: Alert and oriented. No focal neurological deficits. Extremities: Trace left lower extremity edema  skin: No rashes, lesions or ulcers Psychiatry: Judgement and insight appear normal. Mood & affect appropriate.     Data Reviewed: I have personally reviewed following labs and imaging studies  CBC: Recent Labs  Lab 10/03/19 0518 10/04/19 0535  WBC 7.7 7.7  NEUTROABS 5.7  --   HGB 9.7* 8.0*  HCT 32.5* 27.6*  MCV 82.3 82.6  PLT 164 0000000   Basic Metabolic Panel: Recent Labs  Lab 10/03/19 0518 10/04/19 0535  NA 144 139  K 4.4 4.2  CL 111 106  CO2 26 24  GLUCOSE 134* 166*  BUN 18 17  CREATININE 0.92 0.95  CALCIUM 8.9 8.5*   GFR: Estimated Creatinine Clearance: 29.3 mL/min (by C-G formula based on SCr of 0.95 mg/dL). Liver Function Tests: No results for input(s): AST, ALT, ALKPHOS, BILITOT, PROT, ALBUMIN in the last 168 hours. No results for input(s): LIPASE, AMYLASE in the last 168 hours. No results for input(s): AMMONIA in the last 168 hours. Coagulation Profile: Recent Labs  Lab 10/03/19 0518  INR 1.0   Cardiac Enzymes: No results for input(s): CKTOTAL, CKMB, CKMBINDEX, TROPONINI in the last 168 hours. BNP (last 3 results) No results for input(s): PROBNP in the last 8760 hours. HbA1C: No results for input(s): HGBA1C in the last 72 hours. CBG: No results for input(s): GLUCAP in the last 168 hours. Lipid Profile: No results for input(s): CHOL, HDL, LDLCALC, TRIG, CHOLHDL, LDLDIRECT in the last 72 hours. Thyroid Function Tests: No results for input(s): TSH, T4TOTAL, FREET4, T3FREE, THYROIDAB in the last 72 hours. Anemia Panel: No results for input(s): VITAMINB12, FOLATE, FERRITIN, TIBC,  IRON, RETICCTPCT in the last 72 hours. Sepsis Labs: No results for input(s): PROCALCITON, LATICACIDVEN in the last 168 hours.  Recent Results (from the past 240 hour(s))  SARS CORONAVIRUS 2 (TAT 6-24 HRS) Nasopharyngeal Nasopharyngeal Swab     Status: None   Collection Time: 10/03/19  5:18 AM   Specimen: Nasopharyngeal Swab  Result Value Ref Range Status   SARS Coronavirus 2 NEGATIVE NEGATIVE Final    Comment: (NOTE) SARS-CoV-2 target nucleic acids are NOT DETECTED. The SARS-CoV-2 RNA is generally detectable in upper and lower respiratory specimens during the acute phase of infection. Negative results do not preclude SARS-CoV-2 infection, do not rule out co-infections with other pathogens, and should not be used as the sole basis for treatment or other patient management decisions. Negative results must be combined with clinical observations, patient history, and epidemiological information. The expected result is Negative. Fact Sheet for Patients: SugarRoll.be Fact Sheet for Healthcare Providers: https://www.woods-.com/ This test is not yet approved or cleared by the [place] FDA and  has been authorized for detection and/or diagnosis of SARS-CoV-2 by FDA under an Emergency Use Authorization (EUA). This EUA will remain  in effect (meaning this test can be used) for the duration of the COVID-19 declaration under Section 56 4(b)(1) of the Act, 21 U.S.C. section 360bbb-3(b)(1), unless the authorization is terminated or revoked sooner. Performed at [facility], [place] [address] [place], [place] [address]   Respiratory Panel by RT PCR (Flu A&B, Covid) - Nasopharyngeal Swab     Status: None   Collection Time: 10/03/19  1:12 PM   Specimen: Nasopharyngeal Swab  Result Value Ref Range Status   SARS Coronavirus 2 by RT PCR NEGATIVE NEGATIVE Final    Comment: (NOTE) SARS-CoV-2 target nucleic acids are NOT DETECTED. The  SARS-CoV-2 RNA is generally detectable in upper respiratoy specimens during the acute phase of infection. The lowest concentration of SARS-CoV-2 viral copies this assay can detect is 131 copies/mL. A negative result does not preclude SARS-Cov-2 infection and should not be used as the sole basis for treatment or other patient management decisions. A negative result may occur with  improper specimen collection/handling, submission of specimen other than nasopharyngeal swab, presence of viral mutation(s) within the areas targeted by this assay, and inadequate number of viral copies (<131 copies/mL). A negative result must be combined with clinical observations, patient history, and epidemiological information. The expected result is Negative. Fact Sheet for Patients:  PinkCheek.be Fact Sheet for Healthcare Providers:  GravelBags.it This test is not yet ap proved or cleared by the [place] FDA and  has been authorized for detection and/or diagnosis of SARS-CoV-2 by FDA under an Emergency Use Authorization (EUA). This EUA will remain  in effect (meaning this test can be used) for the duration of the COVID-19 declaration under Section 564(b)(1) of the Act, 21 U.S.C. section 360bbb-3(b)(1), unless the authorization is terminated or revoked sooner.    Influenza A by PCR NEGATIVE NEGATIVE Final   Influenza B by PCR NEGATIVE NEGATIVE Final    Comment: (NOTE) The Xpert Xpress SARS-CoV-2/FLU/RSV assay is intended as an aid in  the diagnosis of influenza from Nasopharyngeal swab specimens and  should not be used as a sole basis for treatment. Nasal washings and  aspirates are unacceptable for Xpert Xpress SARS-CoV-2/FLU/RSV  testing. Fact Sheet for Patients: PinkCheek.be Fact Sheet for Healthcare Providers: GravelBags.it This test is not yet approved or cleared by the [place] FDA and  has been authorized for detection and/or diagnosis of SARS-CoV-2 by  FDA under an Emergency Use Authorization (EUA). This EUA will remain  in effect (meaning this test can be used) for the duration of the  Covid-19 declaration under Section 564(b)(1) of the Act, 21  U.S.C. section 360bbb-3(b)(1), unless the authorization is  terminated or revoked. Performed at [facility], [place] [address], [place], [place] [address]          Radiology Studies: DG Chest 1 View  Result Date: 10/03/2019 CLINICAL DATA:  Initial evaluation for acute hip pain, fracture suspected. EXAM: CHEST  1 VIEW COMPARISON:  Prior radiograph from 03/05/2013. FINDINGS: Mild cardiomegaly. Mediastinal silhouette within normal limits. Aortic atherosclerosis. Lungs normally inflated. Mild diffuse interstitial congestion without overt pulmonary edema. Superimposed small left pleural effusion with mild left basilar atelectasis. No focal infiltrates. No pneumothorax. No acute osseous finding. Degenerative changes noted about the shoulders bilaterally. IMPRESSION: 1. Cardiomegaly with mild diffuse pulmonary interstitial congestion and small left pleural effusion. 2. Superimposed left basilar atelectasis. 3.  Aortic Atherosclerosis (ICD10-I70.0). Electronically Signed   By: Jeannine Boga M.D.   On: 10/03/2019 05:29   CT Hip Left  Wo Contrast  Result Date: 10/03/2019 CLINICAL DATA:  Initial evaluation for acute left hip pain, fracture suspected. EXAM: CT OF THE LEFT HIP WITHOUT CONTRAST TECHNIQUE: Multidetector CT imaging of the left hip was performed according to the standard protocol. Multiplanar CT image reconstructions were also generated. COMPARISON:  None. FINDINGS: Bones/Joint/Cartilage There is an acute mildly comminuted intertrochanteric fracture of the proximal left femur. No significant displacement, impaction, or angulation. No subtrochanteric extension. Femoral head intact and remains  normally situated. No associated acetabular fracture. Visualized pelvis and sacrum intact. Moderate osteoarthritic changes present at the left hip. Degenerative change noted at the left ischial tuberosity. No discrete or worrisome osseous lesions. Ligaments Suboptimally assessed by CT. Muscles and Tendons Scattered calcifications noted at the hamstring insertion at the ischial tuberosity, suggesting chronic calcific tendinopathy. Similar changes seen at the gluteal insertion. Soft tissues Mild diffuse edema about the acute left hip fracture. No joint effusion. Moderate retained stool seen within the visualized distal colon. Scattered colonic diverticulosis without evidence for acute diverticulitis. Mild circumferential bladder wall thickening likely related incomplete distension. Scattered vascular calcifications noted. IMPRESSION: Acute comminuted intertrochanteric fracture of the left hip as above. Electronically Signed   By: Jeannine Boga M.D.   On: 10/03/2019 05:36   DG Knee Complete 4 Views Left  Result Date: 10/03/2019 CLINICAL DATA:  Initial evaluation for acute pain status post fall. EXAM: LEFT KNEE - COMPLETE 4+ VIEW COMPARISON:  None available. FINDINGS: No acute fracture or dislocation. Probable small joint effusion noted within the suprapatellar recess. Moderate tricompartmental degenerative osteoarthrosis. Superimposed chondrocalcinosis. Underlying osteopenia. No visible soft tissue injury. Scattered vascular calcifications noted. IMPRESSION: 1. No acute osseous abnormality about the knee. 2. Moderate degenerative tricompartmental osteoarthrosis with associated small joint effusion. Electronically Signed   By: Jeannine Boga M.D.   On: 10/03/2019 03:33   DG C-Arm 1-60 Min-No Report  Result Date: 10/03/2019 Fluoroscopy was utilized by the requesting physician.  No radiographic interpretation.   DG HIP OPERATIVE UNILAT W OR W/O PELVIS LEFT  Result Date: 10/03/2019 CLINICAL DATA:   Left femoral intramedullary nail EXAM: OPERATIVE left HIP (WITH PELVIS IF PERFORMED) 2 VIEWS TECHNIQUE: Fluoroscopic spot image(s) were submitted for interpretation post-operatively. COMPARISON:  10/03/2019 FINDINGS: 4 fluoroscopic images are obtained during the performance of the procedure and are provided for interpretation only. Intramedullary rod, proximal dynamic, and distal interlocking screws are identified traversing the intertrochanteric fracture seen previously. Alignment is near anatomic. FLUOROSCOPY TIME:  1 minutes IMPRESSION: 1. Intraoperative exam as above. Electronically Signed   By: Randa Ngo M.D.   On: 10/03/2019 17:50   DG Hip Unilat W or Wo Pelvis 2-3 Views Left  Result Date: 10/03/2019 CLINICAL DATA:  Initial evaluation for acute pain status post fall. EXAM: DG HIP (WITH OR WITHOUT PELVIS) 2-3V LEFT COMPARISON:  None. FINDINGS: On oblique view, there is question of a subtle lucency extending through the lateral cortex of the left femoral neck, age indeterminate, but could reflect an acute nondisplaced fracture. No other acute fracture or dislocation. Femoral head remains normally position within the acetabulum. Femoral head height maintained. Bony pelvis intact. Advanced osteoarthritic changes about the hips bilaterally. Prominent degenerative spondylosis noted within the lower lumbar spine. No visible soft tissue injury. IMPRESSION: Subtle linear lucency extending through the lateral cortex of the left femoral neck, age indeterminate, but could reflect an acute nondisplaced fracture. Correlation with physical exam for possible pain at this location recommended. Further assessment with dedicated MRI could be performed for further evaluation as clinically  warranted. Electronically Signed   By: Jeannine Boga M.D.   On: 10/03/2019 03:39        Scheduled Meds: . acetaminophen  500 mg Oral Q6H  . aspirin EC  81 mg Oral Daily  . atorvastatin  80 mg Oral q1800  . docusate  sodium  100 mg Oral BID  . donepezil  5 mg Oral QHS  . enoxaparin (LOVENOX) injection  30 mg Subcutaneous Q24H  . escitalopram  10 mg Oral QHS  . feeding supplement (ENSURE ENLIVE)  237 mL Oral BID BM  . ferrous sulfate  325 mg Oral TID PC  . levothyroxine  100 mcg Oral Q0600  . metoprolol tartrate  50 mg Oral BID  . mirtazapine  7.5 mg Oral QHS  . multivitamin with minerals  1 tablet Oral Daily  . pantoprazole  40 mg Oral BID  . senna  1 tablet Oral BID  . traMADol  50 mg Oral Q6H   Continuous Infusions: . 0.45 % NaCl with KCl 20 mEq / L 75 mL/hr at 10/04/19 0300     LOS: 1 day     Georgette Shell, MD 10/04/2019, 8:52 AM

## 2019-10-04 NOTE — Progress Notes (Signed)
Physical Therapy Treatment Patient Details Name: Maria Clarke MRN: [id number] DOB: 08/09/1921 Today's Date: 10/04/2019    History of Present Illness 86 yo female with PMH of recent L occipital lobe CVA (05/2019), right carotid stenosis (60-79%), hypertension, hyperlipidemia, hypothyroidism, atrial tachycardia, CAD, depression, HCL, and dementia who presented from home s/p fall with L hip fx and now s/p IM nailing    PT Comments    Pt continues pleasantly confused with no insight into current circumstances - pt currently thinks she is in the kitchen and is focussed on a can opener.  Pt currently requiring significant assist of 2 for performance of all basic mobility tasks.   Follow Up Recommendations  SNF     Equipment Recommendations  None recommended by PT    Recommendations for Other Services       Precautions / Restrictions Precautions Precautions: Fall Restrictions Weight Bearing Restrictions: No Other Position/Activity Restrictions: WBAT    Mobility  Bed Mobility Overal bed mobility: Needs Assistance Bed Mobility: Sit to Supine     Supine to sit: Mod assist;Max assist;+2 for physical assistance;+2 for safety/equipment Sit to supine: Max assist;+2 for physical assistance;+2 for safety/equipment   General bed mobility comments: Limited participation from pt despite multimodal cues - Physical assist to manage LEs and control trunk.  Transfers Overall transfer level: Needs assistance Equipment used: Rolling walker (2 wheeled) Transfers: Sit to/from Stand Sit to Stand: Mod assist;+2 physical assistance;+2 safety/equipment;From elevated surface         General transfer comment: multimodal cues with physical assist to bring wt up and fwd and to balance in standing  Ambulation/Gait Ambulation/Gait assistance: Min assist;Mod assist;+2 physical assistance;+2 safety/equipment Gait Distance (Feet): 6 Feet Assistive device: Rolling walker (2 wheeled) Gait  Pattern/deviations: Step-to pattern;Decreased step length - right;Decreased step length - left;Shuffle;Trunk flexed;Antalgic Gait velocity: decr   General Gait Details: multimodal cues for posture, position from RW and sequence; physical assist for balance/support and RW management   Stairs             Wheelchair Mobility    Modified Rankin (Stroke Patients Only)       Balance Overall balance assessment: Needs assistance Sitting-balance support: Feet supported;Bilateral upper extremity supported Sitting balance-[name] Scale: Fair     Standing balance support: Bilateral upper extremity supported Standing balance-[name] Scale: Poor                              Cognition Arousal/Alertness: Awake/alert Behavior During Therapy: Impulsive Overall Cognitive Status: History of cognitive impairments - at baseline                                 General Comments: significant dementia - pt focussed on her nieces this session.  Pt c/o L hip soreness multiple times and reassured that she had hurt it but it would be ok.  Each time pt responds with "I did not know that"      Exercises      General Comments        Pertinent Vitals/Pain Pain Assessment: Faces Faces Pain Scale: Hurts little more Pain Location: L hip Pain Descriptors / Indicators: Grimacing;Sore Pain Intervention(s): Limited activity within patient's tolerance;Monitored during session;Premedicated before session    Home Living Family/patient expects to be discharged to:: Unsure               Additional Comments: Dependent  on progress and level of assist at home.  Per chairt, pt has 24/7 hired assist at home    Prior Function Level of Independence: Needs assistance      Comments: pt is unreliable historian 2* dementia   PT Goals (current goals can now be found in the care plan section) Acute Rehab PT Goals Patient Stated Goal: No goals expressed PT Goal Formulation: Patient  unable to participate in goal setting Time For Goal Achievement: 10/17/19 Potential to Achieve Goals: Fair Progress towards PT goals: Progressing toward goals    Frequency    Min 3X/week      PT Plan Current plan remains appropriate    Co-evaluation              AM-PAC PT "6 Clicks" Mobility   Outcome Measure  Help needed turning from your back to your side while in a flat bed without using bedrails?: A Lot Help needed moving from lying on your back to sitting on the side of a flat bed without using bedrails?: A Lot Help needed moving to and from a bed to a chair (including a wheelchair)?: Total Help needed standing up from a chair using your arms (e.g., wheelchair or bedside chair)?: Total Help needed to walk in hospital room?: Total Help needed climbing 3-5 steps with a railing? : Total 6 Click Score: 8    End of Session Equipment Utilized During Treatment: Gait belt Activity Tolerance: Patient tolerated treatment well Patient left: in bed;with call bell/phone within reach;with bed alarm set Nurse Communication: Mobility status PT Visit Diagnosis: Difficulty in walking, not elsewhere classified (R26.2)     Time: [id number] PT Time Calculation (min) (ACUTE ONLY): 14 min  Charges:  $Gait Training: 8-22 mins                     [place] Pager [phone] Office [phone]    Kahley Leib 10/04/2019, 4:38 PM

## 2019-10-04 NOTE — Progress Notes (Signed)
Subjective: 1 Day Post-Op s/p Procedure(s): INTRAMEDULLARY (IM) NAIL FEMORAL   Patient is alert, oriented, laying in bed but trying to get up multiple times on exam. Reports "im doing ok" and "I must of strained something" while pointing to her left hip. No other complaints this morning though history limited due to dementia.    Objective:  PE: VITALS:   Vitals:   10/03/19 1900 10/03/19 2128 10/04/19 0122 10/04/19 0509  BP: (!) 151/43 (!) 110/43 (!) 101/45 135/67  Pulse: 71 77 71 84  Resp: 16 19 18 16   Temp: 97.6 F (36.4 C) 98 F (36.7 C) 98.2 F (36.8 C) 98.3 F (36.8 C)  TempSrc: Oral Oral Oral Oral  SpO2: 93% 100% 99% 100%  Weight:      Height:       General: Alert, no acute distress. Patient laying supine in bed though trying to get up multiple times on exam.  Respiratory: Nasal canula in place, no use of accessory musculature.  GI: Abdomen is soft and non-tender Skin: Skin tear of left elbow covered with bandage, no drainage noted.  MSK: LLE - Dressings in place with moderate drainage. Patient trying to take off dressings during exam. Dressing replaced with new mepilex. Incisions show no signs of infection. EHL and FHL intact. Able to move all toes of left foot without pain. 2+ DP pulse. Unable to assess sensation due to dementia  LABS  Results for orders placed or performed during the hospital encounter of 10/03/19 (from the past 24 hour(s))  Respiratory Panel by RT PCR (Flu A&B, Covid) - Nasopharyngeal Swab     Status: None   Collection Time: 10/03/19  1:12 PM   Specimen: Nasopharyngeal Swab  Result Value Ref Range   SARS Coronavirus 2 by RT PCR NEGATIVE NEGATIVE   Influenza A by PCR NEGATIVE NEGATIVE   Influenza B by PCR NEGATIVE NEGATIVE  CBC     Status: Abnormal   Collection Time: 10/04/19  5:35 AM  Result Value Ref Range   WBC 7.7 4.0 - 10.5 K/uL   RBC 3.34 (L) 3.87 - 5.11 MIL/uL   Hemoglobin 8.0 (L) 12.0 - 15.0 g/dL   HCT 27.6 (L) 36.0 - 46.0 %   MCV 82.6 80.0 - 100.0 fL   MCH 24.0 (L) 26.0 - 34.0 pg   MCHC 29.0 (L) 30.0 - 36.0 g/dL   RDW 15.2 11.5 - 15.5 %   Platelets 153 150 - 400 K/uL   nRBC 0.0 0.0 - 0.2 %  Basic metabolic panel     Status: Abnormal   Collection Time: 10/04/19  5:35 AM  Result Value Ref Range   Sodium 139 135 - 145 mmol/L   Potassium 4.2 3.5 - 5.1 mmol/L   Chloride 106 98 - 111 mmol/L   CO2 24 22 - 32 mmol/L   Glucose, Bld 166 (H) 70 - 99 mg/dL   BUN 17 8 - 23 mg/dL   Creatinine, Ser 0.95 0.44 - 1.00 mg/dL   Calcium 8.5 (L) 8.9 - 10.3 mg/dL   GFR calc non Af Amer 51 (L) >60 mL/min   GFR calc Af Amer 59 (L) >60 mL/min   Anion gap 9 5 - 15    DG Chest 1 View  Result Date: 10/03/2019 CLINICAL DATA:  Initial evaluation for acute hip pain, fracture suspected. EXAM: CHEST  1 VIEW COMPARISON:  Prior radiograph from 03/05/2013. FINDINGS: Mild cardiomegaly. Mediastinal silhouette within normal limits. Aortic atherosclerosis. Lungs normally inflated. Mild  diffuse interstitial congestion without overt pulmonary edema. Superimposed small left pleural effusion with mild left basilar atelectasis. No focal infiltrates. No pneumothorax. No acute osseous finding. Degenerative changes noted about the shoulders bilaterally. IMPRESSION: 1. Cardiomegaly with mild diffuse pulmonary interstitial congestion and small left pleural effusion. 2. Superimposed left basilar atelectasis. 3.  Aortic Atherosclerosis (ICD10-I70.0). Electronically Signed   By: Jeannine Boga M.D.   On: 10/03/2019 05:29   CT Hip Left Wo Contrast  Result Date: 10/03/2019 CLINICAL DATA:  Initial evaluation for acute left hip pain, fracture suspected. EXAM: CT OF THE LEFT HIP WITHOUT CONTRAST TECHNIQUE: Multidetector CT imaging of the left hip was performed according to the standard protocol. Multiplanar CT image reconstructions were also generated. COMPARISON:  None. FINDINGS: Bones/Joint/Cartilage There is an acute mildly comminuted intertrochanteric  fracture of the proximal left femur. No significant displacement, impaction, or angulation. No subtrochanteric extension. Femoral head intact and remains normally situated. No associated acetabular fracture. Visualized pelvis and sacrum intact. Moderate osteoarthritic changes present at the left hip. Degenerative change noted at the left ischial tuberosity. No discrete or worrisome osseous lesions. Ligaments Suboptimally assessed by CT. Muscles and Tendons Scattered calcifications noted at the hamstring insertion at the ischial tuberosity, suggesting chronic calcific tendinopathy. Similar changes seen at the gluteal insertion. Soft tissues Mild diffuse edema about the acute left hip fracture. No joint effusion. Moderate retained stool seen within the visualized distal colon. Scattered colonic diverticulosis without evidence for acute diverticulitis. Mild circumferential bladder wall thickening likely related incomplete distension. Scattered vascular calcifications noted. IMPRESSION: Acute comminuted intertrochanteric fracture of the left hip as above. Electronically Signed   By: Jeannine Boga M.D.   On: 10/03/2019 05:36   DG Knee Complete 4 Views Left  Result Date: 10/03/2019 CLINICAL DATA:  Initial evaluation for acute pain status post fall. EXAM: LEFT KNEE - COMPLETE 4+ VIEW COMPARISON:  None available. FINDINGS: No acute fracture or dislocation. Probable small joint effusion noted within the suprapatellar recess. Moderate tricompartmental degenerative osteoarthrosis. Superimposed chondrocalcinosis. Underlying osteopenia. No visible soft tissue injury. Scattered vascular calcifications noted. IMPRESSION: 1. No acute osseous abnormality about the knee. 2. Moderate degenerative tricompartmental osteoarthrosis with associated small joint effusion. Electronically Signed   By: Jeannine Boga M.D.   On: 10/03/2019 03:33   DG C-Arm 1-60 Min-No Report  Result Date: 10/03/2019 Fluoroscopy was utilized  by the requesting physician.  No radiographic interpretation.   DG HIP OPERATIVE UNILAT W OR W/O PELVIS LEFT  Result Date: 10/03/2019 CLINICAL DATA:  Left femoral intramedullary nail EXAM: OPERATIVE left HIP (WITH PELVIS IF PERFORMED) 2 VIEWS TECHNIQUE: Fluoroscopic spot image(s) were submitted for interpretation post-operatively. COMPARISON:  10/03/2019 FINDINGS: 4 fluoroscopic images are obtained during the performance of the procedure and are provided for interpretation only. Intramedullary rod, proximal dynamic, and distal interlocking screws are identified traversing the intertrochanteric fracture seen previously. Alignment is near anatomic. FLUOROSCOPY TIME:  1 minutes IMPRESSION: 1. Intraoperative exam as above. Electronically Signed   By: Randa Ngo M.D.   On: 10/03/2019 17:50   DG Hip Unilat W or Wo Pelvis 2-3 Views Left  Result Date: 10/03/2019 CLINICAL DATA:  Initial evaluation for acute pain status post fall. EXAM: DG HIP (WITH OR WITHOUT PELVIS) 2-3V LEFT COMPARISON:  None. FINDINGS: On oblique view, there is question of a subtle lucency extending through the lateral cortex of the left femoral neck, age indeterminate, but could reflect an acute nondisplaced fracture. No other acute fracture or dislocation. Femoral head remains normally position within the  acetabulum. Femoral head height maintained. Bony pelvis intact. Advanced osteoarthritic changes about the hips bilaterally. Prominent degenerative spondylosis noted within the lower lumbar spine. No visible soft tissue injury. IMPRESSION: Subtle linear lucency extending through the lateral cortex of the left femoral neck, age indeterminate, but could reflect an acute nondisplaced fracture. Correlation with physical exam for possible pain at this location recommended. Further assessment with dedicated MRI could be performed for further evaluation as clinically warranted. Electronically Signed   By: Jeannine Boga M.D.   On: 10/03/2019  03:39    Assessment/Plan: Principal Problem:   Intertrochanteric fracture of left femur (HCC) Active Problems:   Hypothyroidism   Depression, recurrent (HCC)   Hip fracture (HCC)   Hypertensive urgency   Anemia of chronic disease   Dementia without behavioral disturbance (HCC)   Closed comminuted intertrochanteric fracture of proximal end of left femur (HCC)    1 Day Post-Op s/p Procedure(s): INTRAMEDULLARY (IM) NAIL FEMORAL  Weightbearing: WBAT LLE Insicional and dressing care: PRN dressings changes, may need to be more frequent as patient trying to take off bandages frequently VTE prophylaxis: Lovenox, SCD's Pain control: continue current regimen Follow - up plan: in 2 weeks after discharge with Dr. Mardelle Matte Dispo: PT eval for [facility] vs SNF. Patient currently residing at home with son.   Contact information:   Weekdays 8-5 [address], [place] [phone] A fter hours and holidays please check [email or web address] for group call information for [facility]  Ventura Bruns 10/04/2019, 9:35 AM

## 2019-10-04 NOTE — Anesthesia Postprocedure Evaluation (Signed)
Anesthesia Post Note  Patient: Maria Clarke  Procedure(s) Performed: INTRAMEDULLARY (IM) NAIL FEMORAL (Left Hip)     Patient location during evaluation: PACU Anesthesia Type: Spinal Level of consciousness: awake and alert Pain management: pain level controlled Vital Signs Assessment: post-procedure vital signs reviewed and stable Respiratory status: spontaneous breathing and respiratory function stable Cardiovascular status: blood pressure returned to baseline and stable Postop Assessment: spinal receding Anesthetic complications: no    Last Vitals:  Vitals:   10/03/19 1900 10/03/19 2128  BP: (!) 151/43 (!) 110/43  Pulse: 71 77  Resp: 16 19  Temp: 36.4 C 36.7 C  SpO2: 93% 100%    Last Pain:  Vitals:   10/03/19 2128  TempSrc: Oral  PainSc:                  Zyon Grout DANIEL

## 2019-10-04 NOTE — Evaluation (Signed)
Physical Therapy Evaluation Patient Details Name: Maria Clarke MRN: [id number] DOB: 29-Mar-1922 Today's Date: 10/04/2019   History of Present Illness  86 yo female with PMH of recent L occipital lobe CVA (05/2019), right carotid stenosis (60-79%), hypertension, hyperlipidemia, hypothyroidism, atrial tachycardia, CAD, depression, HCL, and dementia who presented from home s/p fall with L hip fx and now s/p IM nailing  Clinical Impression  Pt admitted as above and presenting with functional mobility limitations 2* decreased L LE strength/ROM, post op pain, balance deficits and dementia related cognitive deficits.  Pt would benefit from follow up rehab at SNF level to maximize IND and safety prior to return home..    Follow Up Recommendations SNF    Equipment Recommendations  None recommended by PT    Recommendations for Other Services       Precautions / Restrictions Precautions Precautions: Fall Restrictions Weight Bearing Restrictions: No Other Position/Activity Restrictions: WBAT      Mobility  Bed Mobility Overal bed mobility: Needs Assistance Bed Mobility: Supine to Sit     Supine to sit: Mod assist;Max assist;+2 for physical assistance;+2 for safety/equipment     General bed mobility comments: Limited participation from pt despite multimodal cues - Use of pad to rotate pt on bed and then to sit up  Transfers Overall transfer level: Needs assistance   Transfers: Sit to/from Stand Sit to Stand: Mod assist;+2 physical assistance;+2 safety/equipment;From elevated surface         General transfer comment: multimodal cues with physical assist to bring wt up and fwd and to balance in standing  Ambulation/Gait Ambulation/Gait assistance: Min assist;Mod assist;+2 physical assistance;+2 safety/equipment Gait Distance (Feet): 8 Feet Assistive device: Rolling walker (2 wheeled) Gait Pattern/deviations: Step-to pattern;Decreased step length - right;Decreased step length -  left;Shuffle;Trunk flexed;Antalgic Gait velocity: decr   General Gait Details: multimodal cues for posture, position from RW and sequence; physical assist for balance/support and RW management  Stairs            Wheelchair Mobility    Modified Rankin (Stroke Patients Only)       Balance Overall balance assessment: Needs assistance Sitting-balance support: Feet supported;Bilateral upper extremity supported Sitting balance-[name] Scale: Fair     Standing balance support: Bilateral upper extremity supported Standing balance-[name] Scale: Poor                               Pertinent Vitals/Pain Pain Assessment: Faces Faces Pain Scale: Hurts little more Pain Location: L hip Pain Descriptors / Indicators: Grimacing;Sore Pain Intervention(s): Limited activity within patient's tolerance;Monitored during session;Premedicated before session(pt refuses ice)    Home Living Family/patient expects to be discharged to:: Unsure                 Additional Comments: Dependent on progress and level of assist at home.  Per chairt, pt has 24/7 hired assist at home    Prior Function Level of Independence: Needs assistance         Comments: pt is unreliable historian 2* dementia     Hand Dominance   Dominant Hand: Right    Extremity/Trunk Assessment   Upper Extremity Assessment Upper Extremity Assessment: Overall WFL for tasks assessed    Lower Extremity Assessment Lower Extremity Assessment: LLE deficits/detail    Cervical / Trunk Assessment Cervical / Trunk Assessment: Kyphotic  Communication   Communication: No difficulties  Cognition Arousal/Alertness: Awake/alert Behavior During Therapy: Impulsive Overall Cognitive Status: History of  cognitive impairments - at baseline                                 General Comments: significant dementia - pt focussed on her nieces this session.  Pt c/o L hip soreness multiple times and reassured  that she had hurt it but it would be ok.  Each time pt responds with "I did not know that"      General Comments      Exercises     Assessment/Plan    PT Assessment Patient needs continued PT services  PT Problem List Decreased strength;Decreased range of motion;Decreased activity tolerance;Decreased balance;Decreased mobility;Decreased cognition;Decreased safety awareness;Decreased knowledge of use of DME;Pain       PT Treatment Interventions DME instruction;Gait training;Functional mobility training;Therapeutic activities;Therapeutic exercise;Patient/family education    PT Goals (Current goals can be found in the Care Plan section)  Acute Rehab PT Goals Patient Stated Goal: No goals expressed PT Goal Formulation: Patient unable to participate in goal setting Time For Goal Achievement: 10/17/19 Potential to Achieve Goals: Fair    Frequency Min 3X/week   Barriers to discharge        Co-evaluation               AM-PAC PT "6 Clicks" Mobility  Outcome Measure Help needed turning from your back to your side while in a flat bed without using bedrails?: A Lot Help needed moving from lying on your back to sitting on the side of a flat bed without using bedrails?: A Lot Help needed moving to and from a bed to a chair (including a wheelchair)?: Total Help needed standing up from a chair using your arms (e.g., wheelchair or bedside chair)?: Total Help needed to walk in hospital room?: Total Help needed climbing 3-5 steps with a railing? : Total 6 Click Score: 8    End of Session Equipment Utilized During Treatment: Gait belt Activity Tolerance: Patient tolerated treatment well Patient left: in chair;with call bell/phone within reach;with chair alarm set Nurse Communication: Mobility status PT Visit Diagnosis: Difficulty in walking, not elsewhere classified (R26.2)    Time: [id number] PT Time Calculation (min) (ACUTE ONLY): 21 min   Charges:   PT Evaluation $PT Eval  Moderate Complexity: [place] Pager [phone] Office [phone]   Antoria Lanza 10/04/2019, 4:34 PM

## 2019-10-05 DIAGNOSIS — D638 Anemia in other chronic diseases classified elsewhere: Secondary | ICD-10-CM

## 2019-10-05 DIAGNOSIS — S50312A Abrasion of left elbow, initial encounter: Secondary | ICD-10-CM

## 2019-10-05 LAB — BASIC METABOLIC PANEL
Anion gap: 5 (ref 5–15)
BUN: 20 mg/dL (ref 8–23)
CO2: 24 mmol/L (ref 22–32)
Calcium: 8.7 mg/dL — ABNORMAL LOW (ref 8.9–10.3)
Chloride: 106 mmol/L (ref 98–111)
Creatinine, Ser: 1.01 mg/dL — ABNORMAL HIGH (ref 0.44–1.00)
GFR calc Af Amer: 55 mL/min — ABNORMAL LOW (ref >60)
GFR calc non Af Amer: 47 mL/min — ABNORMAL LOW (ref >60)
Glucose, Bld: 124 mg/dL — ABNORMAL HIGH (ref 70–99)
Potassium: 4.8 mmol/L (ref 3.5–5.1)
Sodium: 135 mmol/L (ref 135–145)

## 2019-10-05 LAB — CBC
HCT: 27.8 % — ABNORMAL LOW (ref 36.0–46.0)
Hemoglobin: 8.1 g/dL — ABNORMAL LOW (ref 12.0–15.0)
MCH: 24.3 pg — ABNORMAL LOW (ref 26.0–34.0)
MCHC: 29.1 g/dL — ABNORMAL LOW (ref 30.0–36.0)
MCV: 83.5 fL (ref 80.0–100.0)
Platelets: 159 10*3/uL (ref 150–400)
RBC: 3.33 MIL/uL — ABNORMAL LOW (ref 3.87–5.11)
RDW: 15.5 % (ref 11.5–15.5)
WBC: 8.9 10*3/uL (ref 4.0–10.5)
nRBC: 0 % (ref 0.0–0.2)

## 2019-10-05 MED ORDER — METOPROLOL TARTRATE 12.5 MG HALF TABLET
12.5000 mg | ORAL_TABLET | Freq: Two times a day (BID) | ORAL | Status: DC
  Administered 2019-10-06 – 2019-10-07 (×3): 12.5 mg via ORAL
  Filled 2019-10-05 (×3): qty 1

## 2019-10-05 MED ORDER — SODIUM CHLORIDE 0.9 % IV SOLN: INTRAVENOUS | Status: DC

## 2019-10-05 NOTE — TOC Initial Note (Signed)
Transition of Care [facility]) - Initial/Assessment Note    Patient Details  Name: Maria Clarke MRN: [id number] Date of Birth: 03-Dec-1921  Transition of Care [facility]) CM/SW Contact:    Trish Mage, LCSW Phone Number: 10/05/2019, 2:16 PM  Clinical Narrative:   Ms Romanos was seen in follow up to PT note recommending rehab.  Called son [name] to confirm that was the plan, and to find out about preferences.  He has none, and is fine with sending out her information and then making a selection based on ratings of responders.  PASSR initiated, bed search initiated. TOC will continue to follow during the course of hospitalization.                 Expected Discharge Plan: Skilled Nursing Facility Barriers to Discharge: SNF Pending bed offer   Patient Goals and CMS Choice     Choice offered to / list presented to : Adult Children  Expected Discharge Plan and Services Expected Discharge Plan: [place]   Discharge Planning Services: CM Consult Post Acute Care Choice: [place] Living arrangements for the past 2 months: Single Family Home                                      Prior Living Arrangements/Services Living arrangements for the past 2 months: Single Family Home Lives with:: Self Patient language and need for interpreter reviewed:: Yes        Need for Family Participation in Patient Care: Yes (Comment) Care giver support system in place?: Yes (comment) Current home services: DME Criminal Activity/Legal Involvement Pertinent to Current Situation/Hospitalization: No - Comment as needed  Activities of Daily Living   ADL Screening (condition at time of admission) Patient's cognitive ability adequate to safely complete daily activities?: No Is the patient deaf or have difficulty hearing?: No Does the patient have difficulty seeing, even when wearing glasses/contacts?: No Does the patient have difficulty concentrating, remembering, or making  decisions?: No Patient able to express need for assistance with ADLs?: No Does the patient have difficulty dressing or bathing?: No Independently performs ADLs?: No Does the patient have difficulty walking or climbing stairs?: Yes Weakness of Legs: Left Weakness of Arms/Hands: None  Permission Sought/Granted Permission sought to share information with : Family Supports Permission granted to share information with : Yes, Verbal Permission Granted  Share Information with NAME: Michaiah Muzzi     Permission granted to share info w Relationship: son  Permission granted to share info w Contact Information: [phone]  Emotional Assessment Appearance:: Appears stated age Attitude/Demeanor/Rapport: Lethargic Affect (typically observed): Unable to Assess Orientation: : Oriented to Self Alcohol / Substance Use: Not Applicable Psych Involvement: No (comment)  Admission diagnosis:  Hip fracture ([place]) [S72.009A] Abrasion of left elbow, initial encounter [S50.312A] Closed fracture of left hip, initial encounter ([place]) [S72.002A] Fall, initial encounter [W19.XXXA] Closed comminuted intertrochanteric fracture of left femur, initial encounter [facility]) [S72.142A] Patient Active Problem List   Diagnosis Date Noted  . Abrasion of left elbow   . Hip fracture ([place]) 10/03/2019  . Intertrochanteric fracture of left femur ([place]) 10/03/2019  . Hypertensive urgency 10/03/2019  . Anemia of chronic disease 10/03/2019  . Dementia without behavioral disturbance ([place]) 10/03/2019  . Closed comminuted intertrochanteric fracture of proximal end of left femur ([place]) 10/03/2019  . Gastric ulcer without hemorrhage or perforation   . Acute gastric ulcer with hemorrhage   .  Acute GI bleeding 06/18/2019  . Acute blood loss anemia 06/18/2019  . CVA (cerebral vascular accident) ([place]) 05/14/2019  . TIA (transient ischemic attack) 05/12/2019  . Cognitive impairment 10/29/2018  . Shingles outbreak 05/25/2015  . Carotid  stenosis 04/20/2014  . Memory loss, short term 08/13/2013  . Occlusion and stenosis of carotid artery without mention of cerebral infarction 04/20/2013  . Carotid bruit 03/05/2013  . Hearing loss of both ears 11/10/2012  . EDEMA- LOCALIZED 12/15/2009  . Hypothyroidism 12/11/2007  . Depression, recurrent ([place]) 12/11/2007  . Essential hypertension 12/11/2007  . HEMATURIA 02/14/2007   PCP:  [name], NP Pharmacy:   [place], [place] - [address] [place] [address] Phone: [phone] Fax: [phone]  [id number] - [place], [place] [id number] [place] [id number] Phone: [phone] Fax: [phone]  [place] ([place]), [place] - [address] [place] ([place]  [address] Phone: [phone] Fax: [phone]     Social Determinants of Health (SDOH) Interventions    Readmission Risk Interventions No flowsheet data found.

## 2019-10-05 NOTE — Progress Notes (Signed)
PROGRESS NOTE    Maria Clarke  [id number] DOB: [birth year] DOA: 10/03/2019 PCP: [name], NP   Brief Narrative:86 y.o.femalewith medical history significant fordementia,hypertension,hypothyroidism and dyslipidemia who presented to the emergency room after a fall at home.PerEMS patient was found lying on her left side.No obvious deformities noted.An x-ray of the left hip was done in the ERwhich showed subtle linear lucency extending through the lateral cortex of the left femoral neck, age indeterminate, but could reflect an acute nondisplaced fracture.She had a CTscan of the left hip which showedacute comminuted intertrochanteric fracture of the left hip as above. I am unable to do review of systems due to patient's underlying dementia  ED Course:Patient presents afteramechanical fall. Shewas noted tohavesignificant tenderness to palpation of left hip and with range of motion. She is also keeping the leg externally rotated. Strong suspicion for a left hip fracture. X-ray does not show definitive evidence of fracture. I discussed the case with Dr. Mardelle Matte with orthopedic surgery. He requestedto get aCT left hip, if this is inconclusive then she will require MRI. Discussed this with patient and her son. 6:19 AM CT confirms left intertrochanteric fracture. Patient had no evidence of head injury. No other acute traumatic injury. Dr. Mardelle Matte was consulted for orthopedics. Admit to hospitalist.  Assessment & Plan:   Principal Problem:   Intertrochanteric fracture of left femur (HCC) Active Problems:   Hypothyroidism   Depression, recurrent (HCC)   Hip fracture (HCC)   Hypertensive urgency   Anemia of chronic disease   Dementia without behavioral disturbance (HCC)   Closed comminuted intertrochanteric fracture of proximal end of left femur (HCC)   1 status post mechanical fall with left intertrochanteric fracture-status post IM nailing  10/03/2019. Pain control with Tylenol PT consult recommends SNF  Patient son now agreeable for her to go to SNF. Discussed with toc Bowel regime Colace and senna DVT prophylaxis with Lovenox  2 essential hypertension with hypertensive urgency on admission-blood pressure 110/60 decrease the dose of metoprolol to 12.5 twice daily.  3 anemia of chronic disease hemoglobin hemoglobin 9.9 on admission down to 8.1 today.  Likely some postop component.  Monitor in a.m.  4 hypothyroidism continue Synthroid  5 dementia on Aricept  6 depression on Lexapro  7 hyperlipidemia on Lipitor  8 increased creatinine up to 1.01 from 0.95.  Continue IV fluids for now.  Nutrition Problem: Increased nutrient needs Etiology: post-op healing     Signs/Symptoms: estimated needs    Interventions: Ensure Enlive (each supplement provides 350kcal and 20 grams of protein), Magic cup, MVI  Estimated body mass index is 22.14 kg/m as calculated from the following:   Height as of this encounter: 5\' 3"  (1.6 m).   Weight as of this encounter: 56.7 kg. DVT prophylaxis: Lovenox  code Status: DO NOT RESUSCITATE  family Communication:dw son Disposition Plan: Patient came from home PT recommends SNF Plan will be to discharge to SNF Barriers to discharge none  Consultants:   ortho  Procedures:left hip fracture status post IM nailing Antimicrobials: None  Subjective: Patient resting in bed had an uneventful night No nausea vomiting Objective: Vitals:   10/04/19 0509 10/04/19 1419 10/04/19 2041 10/05/19 0548  BP: 135/67 125/74 136/61 110/60  Pulse: 84 70 91 65  Resp: 16 18 16 17   Temp: 98.3 F (36.8 C) 98 F (36.7 C) 98.3 F (36.8 C) 98.3 F (36.8 C)  TempSrc: Oral Oral Oral Oral  SpO2: 100% 92% 93% 97%  Weight:  Height:       No intake or output data in the 24 hours ending 10/05/19 1113 Filed Weights   10/03/19 0153  Weight: 56.7 kg    Examination:  General exam:  Appears calm and comfortable  Respiratory system: Clear to auscultation. Respiratory effort normal. Cardiovascular system: S1 & S2 heard, RRR. No JVD, murmurs, rubs, gallops or clicks. No pedal edema. Gastrointestinal system: Abdomen is nondistended, soft and nontender. No organomegaly or masses felt. Normal bowel sounds heard. Central nervous system: Alert and oriented. No focal neurological deficits. Extremities: Left hip incision covered with dressings which looks clean and intact  skin: No rashes, lesions or ulcers Psychiatry: Judgement and insight appear normal. Mood & affect appropriate.     Data Reviewed: I have personally reviewed following labs and imaging studies  CBC: Recent Labs  Lab 10/03/19 0518 10/04/19 0535 10/05/19 0534  WBC 7.7 7.7 8.9  NEUTROABS 5.7  --   --   HGB 9.7* 8.0* 8.1*  HCT 32.5* 27.6* 27.8*  MCV 82.3 82.6 83.5  PLT 164 153 [id number]   Basic Metabolic Panel: Recent Labs  Lab 10/03/19 0518 10/04/19 0535 10/05/19 0534  NA 144 139 135  K 4.4 4.2 4.8  CL 111 106 106  CO2 26 24 24   GLUCOSE 134* 166* 124*  BUN 18 17 20   CREATININE 0.92 0.95 1.01*  CALCIUM 8.9 8.5* 8.7*   GFR: Estimated Creatinine Clearance: 27.6 mL/min (A) (by C-G formula based on SCr of 1.01 mg/dL (H)). Liver Function Tests: No results for input(s): AST, ALT, ALKPHOS, BILITOT, PROT, ALBUMIN in the last 168 hours. No results for input(s): LIPASE, AMYLASE in the last 168 hours. No results for input(s): AMMONIA in the last 168 hours. Coagulation Profile: Recent Labs  Lab 10/03/19 0518  INR 1.0   Cardiac Enzymes: No results for input(s): CKTOTAL, CKMB, CKMBINDEX, TROPONINI in the last 168 hours. BNP (last 3 results) No results for input(s): PROBNP in the last 8760 hours. HbA1C: No results for input(s): HGBA1C in the last 72 hours. CBG: No results for input(s): GLUCAP in the last 168 hours. Lipid Profile: No results for input(s): CHOL, HDL, LDLCALC, TRIG, CHOLHDL, LDLDIRECT  in the last 72 hours. Thyroid Function Tests: No results for input(s): TSH, T4TOTAL, FREET4, T3FREE, THYROIDAB in the last 72 hours. Anemia Panel: No results for input(s): VITAMINB12, FOLATE, FERRITIN, TIBC, IRON, RETICCTPCT in the last 72 hours. Sepsis Labs: No results for input(s): PROCALCITON, LATICACIDVEN in the last 168 hours.  Recent Results (from the past 240 hour(s))  SARS CORONAVIRUS 2 (TAT 6-24 HRS) Nasopharyngeal Nasopharyngeal Swab     Status: None   Collection Time: 10/03/19  5:18 AM   Specimen: Nasopharyngeal Swab  Result Value Ref Range Status   SARS Coronavirus 2 NEGATIVE NEGATIVE Final    Comment: (NOTE) SARS-CoV-2 target nucleic acids are NOT DETECTED. The SARS-CoV-2 RNA is generally detectable in upper and lower respiratory specimens during the acute phase of infection. Negative results do not preclude SARS-CoV-2 infection, do not rule out co-infections with other pathogens, and should not be used as the sole basis for treatment or other patient management decisions. Negative results must be combined with clinical observations, patient history, and epidemiological information. The expected result is Negative. Fact Sheet for Patients: SugarRoll.be Fact Sheet for Healthcare Providers: https://www.woods-.com/ This test is not yet approved or cleared by the [place] FDA and  has been authorized for detection and/or diagnosis of SARS-CoV-2 by FDA under an Emergency Use Authorization (EUA). This  EUA will remain  in effect (meaning this test can be used) for the duration of the COVID-19 declaration under Section 56 4(b)(1) of the Act, 21 U.S.C. section 360bbb-3(b)(1), unless the authorization is terminated or revoked sooner. Performed at [facility], [place] [address] [place], [place] [address]   Respiratory Panel by RT PCR (Flu A&B, Covid) - Nasopharyngeal Swab     Status: None   Collection Time: 10/03/19   1:12 PM   Specimen: Nasopharyngeal Swab  Result Value Ref Range Status   SARS Coronavirus 2 by RT PCR NEGATIVE NEGATIVE Final    Comment: (NOTE) SARS-CoV-2 target nucleic acids are NOT DETECTED. The SARS-CoV-2 RNA is generally detectable in upper respiratoy specimens during the acute phase of infection. The lowest concentration of SARS-CoV-2 viral copies this assay can detect is 131 copies/mL. A negative result does not preclude SARS-Cov-2 infection and should not be used as the sole basis for treatment or other patient management decisions. A negative result may occur with  improper specimen collection/handling, submission of specimen other than nasopharyngeal swab, presence of viral mutation(s) within the areas targeted by this assay, and inadequate number of viral copies (<131 copies/mL). A negative result must be combined with clinical observations, patient history, and epidemiological information. The expected result is Negative. Fact Sheet for Patients:  PinkCheek.be Fact Sheet for Healthcare Providers:  GravelBags.it This test is not yet ap proved or cleared by the [place] FDA and  has been authorized for detection and/or diagnosis of SARS-CoV-2 by FDA under an Emergency Use Authorization (EUA). This EUA will remain  in effect (meaning this test can be used) for the duration of the COVID-19 declaration under Section 564(b)(1) of the Act, 21 U.S.C. section 360bbb-3(b)(1), unless the authorization is terminated or revoked sooner.    Influenza A by PCR NEGATIVE NEGATIVE Final   Influenza B by PCR NEGATIVE NEGATIVE Final    Comment: (NOTE) The Xpert Xpress SARS-CoV-2/FLU/RSV assay is intended as an aid in  the diagnosis of influenza from Nasopharyngeal swab specimens and  should not be used as a sole basis for treatment. Nasal washings and  aspirates are unacceptable for Xpert Xpress SARS-CoV-2/FLU/RSV   testing. Fact Sheet for Patients: PinkCheek.be Fact Sheet for Healthcare Providers: GravelBags.it This test is not yet approved or cleared by the [place] FDA and  has been authorized for detection and/or diagnosis of SARS-CoV-2 by  FDA under an Emergency Use Authorization (EUA). This EUA will remain  in effect (meaning this test can be used) for the duration of the  Covid-19 declaration under Section 564(b)(1) of the Act, 21  U.S.C. section 360bbb-3(b)(1), unless the authorization is  terminated or revoked. Performed at [facility], [place] [address] [place], [place] [address]          Radiology Studies: DG C-Arm 1-60 Min-No Report  Result Date: 10/03/2019 Fluoroscopy was utilized by the requesting physician.  No radiographic interpretation.   DG HIP OPERATIVE UNILAT W OR W/O PELVIS LEFT  Result Date: 10/03/2019 CLINICAL DATA:  Left femoral intramedullary nail EXAM: OPERATIVE left HIP (WITH PELVIS IF PERFORMED) 2 VIEWS TECHNIQUE: Fluoroscopic spot image(s) were submitted for interpretation post-operatively. COMPARISON:  10/03/2019 FINDINGS: 4 fluoroscopic images are obtained during the performance of the procedure and are provided for interpretation only. Intramedullary rod, proximal dynamic, and distal interlocking screws are identified traversing the intertrochanteric fracture seen previously. Alignment is near anatomic. FLUOROSCOPY TIME:  1 minutes IMPRESSION: 1. Intraoperative exam as above. Electronically Signed  By: Randa Ngo M.D.   On: 10/03/2019 17:50        Scheduled Meds: . acetaminophen  650 mg Oral TID  . aspirin EC  81 mg Oral Daily  . atorvastatin  80 mg Oral q1800  . docusate sodium  100 mg Oral BID  . donepezil  5 mg Oral QHS  . enoxaparin (LOVENOX) injection  30 mg Subcutaneous Q24H  . escitalopram  10 mg Oral QHS  . feeding supplement (ENSURE ENLIVE)  237 mL Oral BID  BM  . ferrous sulfate  325 mg Oral TID PC  . levothyroxine  100 mcg Oral Q0600  . metoprolol tartrate  50 mg Oral BID  . mirtazapine  7.5 mg Oral QHS  . multivitamin with minerals  1 tablet Oral Daily  . pantoprazole  40 mg Oral BID  . senna  1 tablet Oral BID  . traMADol  50 mg Oral Q6H   Continuous Infusions: . 0.45 % NaCl with KCl 20 mEq / L 75 mL/hr at 10/04/19 0300     LOS: 2 days     Georgette Shell, MD 10/05/2019, 11:13 AM

## 2019-10-05 NOTE — Progress Notes (Signed)
Subjective: 2 Days Post-Op s/p Procedure(s): INTRAMEDULLARY (IM) NAIL FEMORAL  Patient somnolent on exam, waking up intermittently.   Objective:  PE: VITALS:   Vitals:   10/04/19 0509 10/04/19 1419 10/04/19 2041 10/05/19 0548  BP: 135/67 125/74 136/61 110/60  Pulse: 84 70 91 65  Resp: 16 18 16 17   Temp: 98.3 F (36.8 C) 98 F (36.7 C) 98.3 F (36.8 C) 98.3 F (36.8 C)  TempSrc: Oral Oral Oral Oral  SpO2: 100% 92% 93% 97%  Weight:      Height:       General: Alert, no acute distress, sleeping. Respiratory: No use of accessory musculature.  GI: Abdomen is soft and non-tender Skin:Skin tear of left elbow covered with bandage, no drainage noted.  MSK: LLE - Dressings in place with no drainage. EHL and FHL intact. Able to move all toes of left foot. 2+ DP pulse. Unable to assess sensation due to dementia  LABS  Results for orders placed or performed during the hospital encounter of 10/03/19 (from the past 24 hour(s))  CBC     Status: Abnormal   Collection Time: 10/05/19  5:34 AM  Result Value Ref Range   WBC 8.9 4.0 - 10.5 K/uL   RBC 3.33 (L) 3.87 - 5.11 MIL/uL   Hemoglobin 8.1 (L) 12.0 - 15.0 g/dL   HCT 27.8 (L) 36.0 - 46.0 %   MCV 83.5 80.0 - 100.0 fL   MCH 24.3 (L) 26.0 - 34.0 pg   MCHC 29.1 (L) 30.0 - 36.0 g/dL   RDW 15.5 11.5 - 15.5 %   Platelets 159 150 - 400 K/uL   nRBC 0.0 0.0 - 0.2 %  Basic metabolic panel     Status: Abnormal   Collection Time: 10/05/19  5:34 AM  Result Value Ref Range   Sodium 135 135 - 145 mmol/L   Potassium 4.8 3.5 - 5.1 mmol/L   Chloride 106 98 - 111 mmol/L   CO2 24 22 - 32 mmol/L   Glucose, Bld 124 (H) 70 - 99 mg/dL   BUN 20 8 - 23 mg/dL   Creatinine, Ser 1.01 (H) 0.44 - 1.00 mg/dL   Calcium 8.7 (L) 8.9 - 10.3 mg/dL   GFR calc non Af Amer 47 (L) >60 mL/min   GFR calc Af Amer 55 (L) >60 mL/min   Anion gap 5 5 - 15    DG C-Arm 1-60 Min-No Report  Result Date: 10/03/2019 Fluoroscopy was utilized by the requesting  physician.  No radiographic interpretation.   DG HIP OPERATIVE UNILAT W OR W/O PELVIS LEFT  Result Date: 10/03/2019 CLINICAL DATA:  Left femoral intramedullary nail EXAM: OPERATIVE left HIP (WITH PELVIS IF PERFORMED) 2 VIEWS TECHNIQUE: Fluoroscopic spot image(s) were submitted for interpretation post-operatively. COMPARISON:  10/03/2019 FINDINGS: 4 fluoroscopic images are obtained during the performance of the procedure and are provided for interpretation only. Intramedullary rod, proximal dynamic, and distal interlocking screws are identified traversing the intertrochanteric fracture seen previously. Alignment is near anatomic. FLUOROSCOPY TIME:  1 minutes IMPRESSION: 1. Intraoperative exam as above. Electronically Signed   By: Randa Ngo M.D.   On: 10/03/2019 17:50    Assessment/Plan: Principal Problem:   Intertrochanteric fracture of left femur (HCC) Active Problems:   Hypothyroidism   Depression, recurrent (HCC)   Hip fracture (HCC)   Hypertensive urgency   Anemia of chronic disease   Dementia without behavioral disturbance (HCC)   Closed comminuted intertrochanteric fracture of proximal end of left femur (  [place])    2 Days Post-Op s/p Procedure(s): INTRAMEDULLARY (IM) NAIL FEMORAL  Weightbearing: WBAT LLE Insicional and dressing care: PRN dressings changes VTE prophylaxis: Lovenox, SCD's Pain control: continue current regimen Follow - up plan: in 2 weeks after discharge with Dr. Mardelle Matte Dispo: PT recommending SNF, TOC consult placed.  Contact information:   Weekdays 8-5 Merlene Pulling, [place] [phone] A fter hours and holidays please check [email or web address] for group call information for [facility]  Ventura Bruns 10/05/2019, 7:40 AM

## 2019-10-05 NOTE — NC FL2 (Signed)
[place] MEDICAID FL2 LEVEL OF CARE SCREENING TOOL     IDENTIFICATION  Patient Name: Maria Clarke Birthdate: Oct 29, 1921 Sex: female Admission Date (Current Location): 10/03/2019  [facility] and [place] Number:  Herbalist and Address:  [facility],  [place], [place]      Provider Number: [id number]  Attending Physician Name and Address:  Georgette Shell, MD  Relative Name and Phone Number:       Current Level of Care: Hospital Recommended Level of Care: [place] Prior Approval Number:    Date Approved/Denied:   PASRR Number:    Discharge Plan: SNF    Current Diagnoses: Patient Active Problem List   Diagnosis Date Noted  . Abrasion of left elbow   . Hip fracture ([place]) 10/03/2019  . Intertrochanteric fracture of left femur ([place]) 10/03/2019  . Hypertensive urgency 10/03/2019  . Anemia of chronic disease 10/03/2019  . Dementia without behavioral disturbance ([place]) 10/03/2019  . Closed comminuted intertrochanteric fracture of proximal end of left femur ([place]) 10/03/2019  . Gastric ulcer without hemorrhage or perforation   . Acute gastric ulcer with hemorrhage   . Acute GI bleeding 06/18/2019  . Acute blood loss anemia 06/18/2019  . CVA (cerebral vascular accident) ([place]) 05/14/2019  . TIA (transient ischemic attack) 05/12/2019  . Cognitive impairment 10/29/2018  . Shingles outbreak 05/25/2015  . Carotid stenosis 04/20/2014  . Memory loss, short term 08/13/2013  . Occlusion and stenosis of carotid artery without mention of cerebral infarction 04/20/2013  . Carotid bruit 03/05/2013  . Hearing loss of both ears 11/10/2012  . EDEMA- LOCALIZED 12/15/2009  . Hypothyroidism 12/11/2007  . Depression, recurrent ([place]) 12/11/2007  . Essential hypertension 12/11/2007  . HEMATURIA 02/14/2007    Orientation RESPIRATION BLADDER Height & Weight     Self  Normal External catheter Weight: 56.7 kg Height:  5\' 3"  (160  cm)  BEHAVIORAL SYMPTOMS/MOOD NEUROLOGICAL BOWEL NUTRITION STATUS  (none) (none) Incontinent Diet(see d/c summary)  AMBULATORY STATUS COMMUNICATION OF NEEDS Skin   Extensive Assist Verbally Surgical wounds(L Hip incision)                       Personal Care Assistance Level of Assistance  Bathing, Feeding, Dressing Bathing Assistance: Maximum assistance Feeding assistance: Limited assistance Dressing Assistance: Maximum assistance     Functional Limitations Info  Sight, Hearing, Speech Sight Info: Adequate Hearing Info: Adequate Speech Info: Adequate    SPECIAL CARE FACTORS FREQUENCY  PT (By licensed PT), OT (By licensed OT)     PT Frequency: 5X/W OT Frequency: 5X/W            Contractures Contractures Info: Not present    Additional Factors Info  Code Status, Allergies Code Status Info: DNR Allergies Info: Sulfonamide Derivatives, Mirtazapine           Current Medications (10/05/2019):  This is the current hospital active medication list Current Facility-Administered Medications  Medication Dose Route Frequency Provider Last Rate Last Admin  . 0.9 %  sodium chloride infusion   Intravenous Continuous Georgette Shell, MD 75 mL/hr at 10/05/19 1135 New Bag at 10/05/19 1135  . acetaminophen (TYLENOL) tablet 650 mg  650 mg Oral TID Georgette Shell, MD   650 mg at 10/05/19 1142  . alum & mag hydroxide-simeth (MAALOX/MYLANTA) 200-200-20 MG/5ML suspension 30 mL  30 mL Oral Q4H PRN Merlene Pulling K, PA-C      . aspirin EC tablet 81  mg  81 mg Oral Daily Ventura Bruns, PA-C   81 mg at 10/05/19 1142  . atorvastatin (LIPITOR) tablet 80 mg  80 mg Oral q1800 Ventura Bruns, PA-C   80 mg at 10/04/19 1810  . bisacodyl (DULCOLAX) suppository 10 mg  10 mg Rectal Daily PRN Merlene Pulling K, PA-C      . docusate sodium (COLACE) capsule 100 mg  100 mg Oral BID Merlene Pulling K, PA-C   100 mg at 10/05/19 1143  . donepezil (ARICEPT) tablet 5 mg  5 mg Oral QHS Merlene Pulling  K, PA-C   5 mg at 10/04/19 2242  . enoxaparin (LOVENOX) injection 30 mg  30 mg Subcutaneous Q24H Merlene Pulling K, PA-C   30 mg at 10/05/19 1136  . escitalopram (LEXAPRO) tablet 10 mg  10 mg Oral QHS Merlene Pulling K, PA-C   10 mg at 10/04/19 2242  . feeding supplement (ENSURE ENLIVE) (ENSURE ENLIVE) liquid 237 mL  237 mL Oral BID BM Merlene Pulling K, PA-C   237 mL at 10/05/19 1143  . ferrous sulfate tablet 325 mg  325 mg Oral TID PC Brown, Blaine K, PA-C   325 mg at 10/05/19 1335  . hydrALAZINE (APRESOLINE) injection 10 mg  10 mg Intravenous Q6H PRN Merlene Pulling K, PA-C   10 mg at 10/03/19 [id number]  . levothyroxine (SYNTHROID) tablet 100 mcg  100 mcg Oral Q0600 Ventura Bruns, PA-C   Stopped at 10/05/19 0645  . magnesium citrate solution 1 Bottle  1 Bottle Oral Once PRN Merlene Pulling K, PA-C      . menthol-cetylpyridinium (CEPACOL) lozenge 3 mg  1 lozenge Oral PRN Merlene Pulling K, PA-C       Or  . phenol (CHLORASEPTIC) mouth spray 1 spray  1 spray Mouth/Throat PRN Merlene Pulling K, PA-C      . metoprolol tartrate (LOPRESSOR) tablet 12.5 mg  12.5 mg Oral BID Georgette Shell, MD      . mirtazapine (REMERON) tablet 7.5 mg  7.5 mg Oral QHS Owens Shark, Blaine K, PA-C   7.5 mg at 10/04/19 2245  . multivitamin with minerals tablet 1 tablet  1 tablet Oral Daily Merlene Pulling K, PA-C   1 tablet at 10/04/19 1545  . ondansetron (ZOFRAN) tablet 4 mg  4 mg Oral Q6H PRN Merlene Pulling K, PA-C       Or  . ondansetron [facility]) injection 4 mg  4 mg Intravenous Q6H PRN Merlene Pulling K, PA-C      . pantoprazole (PROTONIX) EC tablet 40 mg  40 mg Oral BID Merlene Pulling K, PA-C   40 mg at 10/05/19 1145  . polyethylene glycol (MIRALAX / GLYCOLAX) packet 17 g  17 g Oral Daily PRN Merlene Pulling K, PA-C      . senna (SENOKOT) tablet 8.6 mg  1 tablet Oral BID Merlene Pulling K, PA-C   Stopped at 10/05/19 1145  . traMADol (ULTRAM) tablet 50 mg  50 mg Oral Q6H Brown, Blaine K, PA-C   50 mg at 10/05/19 1142     Discharge  Medications: Please see discharge summary for a list of discharge medications.  Relevant Imaging Results:  Relevant Lab Results:   Additional Information [name], [place]

## 2019-10-06 ENCOUNTER — Encounter: Payer: Self-pay | Admitting: *Deleted

## 2019-10-06 LAB — CBC
HCT: 30.8 % — ABNORMAL LOW (ref 36.0–46.0)
Hemoglobin: 8.9 g/dL — ABNORMAL LOW (ref 12.0–15.0)
MCH: 24.2 pg — ABNORMAL LOW (ref 26.0–34.0)
MCHC: 28.9 g/dL — ABNORMAL LOW (ref 30.0–36.0)
MCV: 83.7 fL (ref 80.0–100.0)
Platelets: 196 10*3/uL (ref 150–400)
RBC: 3.68 MIL/uL — ABNORMAL LOW (ref 3.87–5.11)
RDW: 15.6 % — ABNORMAL HIGH (ref 11.5–15.5)
WBC: 7.1 10*3/uL (ref 4.0–10.5)
nRBC: 0 % (ref 0.0–0.2)

## 2019-10-06 LAB — BASIC METABOLIC PANEL
Anion gap: 8 (ref 5–15)
BUN: 18 mg/dL (ref 8–23)
CO2: 21 mmol/L — ABNORMAL LOW (ref 22–32)
Calcium: 8.6 mg/dL — ABNORMAL LOW (ref 8.9–10.3)
Chloride: 108 mmol/L (ref 98–111)
Creatinine, Ser: 0.91 mg/dL (ref 0.44–1.00)
GFR calc Af Amer: >60 mL/min (ref >60)
GFR calc non Af Amer: 54 mL/min — ABNORMAL LOW (ref >60)
Glucose, Bld: 91 mg/dL (ref 70–99)
Potassium: 4.5 mmol/L (ref 3.5–5.1)
Sodium: 137 mmol/L (ref 135–145)

## 2019-10-06 NOTE — Progress Notes (Signed)
Subjective: 3 Days Post-Op s/p Procedure(s): INTRAMEDULLARY (IM) NAIL FEMORAL   Patient is sleeping in chair. Patient unable to relay pain levels and ROS unable to be performed due to dementia.   Objective:  PE: VITALS:   Vitals:   10/06/19 0217 10/06/19 0532 10/06/19 0958 10/06/19 1530  BP: (!) 119/48 (!) 151/48 (!) 148/56 (!) 151/45  Pulse: 75 68 70 71  Resp:  16    Temp:  97.8 F (36.6 C)  98.1 F (36.7 C)  TempSrc:  Oral  Oral  SpO2:  98%  97%  Weight:      Height:       General: Alert, no acute distress, sleeping. Respiratory:No use of accessory musculature.  [id number] is soft and non-tender Skin:Skin tear of left elbow covered with bandage, no drainage noted.  MSK: LLE - Incisions with no drainage, uncovered at time of exam.  EHL and FHL intact. Able to move all toes of left foot. 2+ DP pulse.Unable to assess sensation due to dementia. Mild grunting with passive straight leg raise.   LABS  Results for orders placed or performed during the hospital encounter of 10/03/19 (from the past 24 hour(s))  CBC     Status: Abnormal   Collection Time: 10/06/19  5:54 AM  Result Value Ref Range   WBC 7.1 4.0 - 10.5 K/uL   RBC 3.68 (L) 3.87 - 5.11 MIL/uL   Hemoglobin 8.9 (L) 12.0 - 15.0 g/dL   HCT 30.8 (L) 36.0 - 46.0 %   MCV 83.7 80.0 - 100.0 fL   MCH 24.2 (L) 26.0 - 34.0 pg   MCHC 28.9 (L) 30.0 - 36.0 g/dL   RDW 15.6 (H) 11.5 - 15.5 %   Platelets 196 150 - 400 K/uL   nRBC 0.0 0.0 - 0.2 %  Basic metabolic panel     Status: Abnormal   Collection Time: 10/06/19  5:54 AM  Result Value Ref Range   Sodium 137 135 - 145 mmol/L   Potassium 4.5 3.5 - 5.1 mmol/L   Chloride 108 98 - 111 mmol/L   CO2 21 (L) 22 - 32 mmol/L   Glucose, Bld 91 70 - 99 mg/dL   BUN 18 8 - 23 mg/dL   Creatinine, Ser 0.91 0.44 - 1.00 mg/dL   Calcium 8.6 (L) 8.9 - 10.3 mg/dL   GFR calc non Af Amer 54 (L) >60 mL/min   GFR calc Af Amer >60 >60 mL/min   Anion gap 8 5 - 15    No results  found.  Assessment/Plan: Principal Problem:   Intertrochanteric fracture of left femur (HCC) Active Problems:   Hypothyroidism   Depression, recurrent (HCC)   Hip fracture (HCC)   Hypertensive urgency   Anemia of chronic disease   Dementia without behavioral disturbance (HCC)   Closed comminuted intertrochanteric fracture of proximal end of left femur (HCC)   Abrasion of left elbow    3 Days Post-Op s/p Procedure(s): INTRAMEDULLARY (IM) NAIL FEMORAL  Weightbearing:WBAT LLE Insicional and dressing care:PRN dressings changes, patient continues to pull off dressings.  VTE prophylaxis:Lovenox, SCD's Pain control:continue current regimen Follow - up plan:in 2 weeks after discharge with Dr. Mardelle Matte Dispo: PT recommending SNF, son agrees. Ok to discharge to SNF from orthopedic standpoint whenever medically ready and bed available.   Contact information:   Weekdays 8-5 Merlene Pulling, [place] [phone] A fter hours and holidays please check [email or web address] for group call information for [facility]  Ventura Bruns 10/06/2019,  5:36 PM

## 2019-10-06 NOTE — Care Management Important Message (Signed)
Important Message  Patient Details IM Letter given to Roque Lias SW Case Manager to present to the Patient Name: Maria Clarke MRN: [id number] Date of Birth: March 17, 1922   Medicare Important Message Given:  Yes     Kerin Salen 10/06/2019, 9:57 AM

## 2019-10-06 NOTE — NC FL2 (Signed)
[place] MEDICAID FL2 LEVEL OF CARE SCREENING TOOL     IDENTIFICATION  Patient Name: Maria Clarke Birthdate: [birth year] Sex: female Admission Date (Current Location): 10/03/2019  [facility] and [place] Number:  Herbalist and Address:  [facility],  [place], [place]      Provider Number: [id number]  Attending Physician Name and Address:  Georgette Shell, MD  Relative Name and Phone Number:       Current Level of Care: Hospital Recommended Level of Care: [facility] [place] Prior Approval Number:    Date Approved/Denied:   PASRR Number:  [id number]  Discharge Plan: SNF    Current Diagnoses: Patient Active Problem List   Diagnosis Date Noted  . Abrasion of left elbow   . Hip fracture ([place]) 10/03/2019  . Intertrochanteric fracture of left femur ([place]) 10/03/2019  . Hypertensive urgency 10/03/2019  . Anemia of chronic disease 10/03/2019  . Dementia without behavioral disturbance ([place]) 10/03/2019  . Closed comminuted intertrochanteric fracture of proximal end of left femur ([place]) 10/03/2019  . Gastric ulcer without hemorrhage or perforation   . Acute gastric ulcer with hemorrhage   . Acute GI bleeding 06/18/2019  . Acute blood loss anemia 06/18/2019  . CVA (cerebral vascular accident) ([place]) 05/14/2019  . TIA (transient ischemic attack) 05/12/2019  . Cognitive impairment 10/29/2018  . Shingles outbreak 05/25/2015  . Carotid stenosis 04/20/2014  . Memory loss, short term 08/13/2013  . Occlusion and stenosis of carotid artery without mention of cerebral infarction 04/20/2013  . Carotid bruit 03/05/2013  . Hearing loss of both ears 11/10/2012  . EDEMA- LOCALIZED 12/15/2009  . Hypothyroidism 12/11/2007  . Depression, recurrent ([place]) 12/11/2007  . Essential hypertension 12/11/2007  . HEMATURIA 02/14/2007    Orientation RESPIRATION BLADDER Height & Weight     Self  Normal External catheter Weight: 56.7 kg Height:   5\' 3"  (160 cm)  BEHAVIORAL SYMPTOMS/MOOD NEUROLOGICAL BOWEL NUTRITION STATUS  (none) (none) Incontinent Diet(see d/c summary)  AMBULATORY STATUS COMMUNICATION OF NEEDS Skin   Extensive Assist Verbally Surgical wounds(L Hip incision)                       Personal Care Assistance Level of Assistance  Bathing, Feeding, Dressing Bathing Assistance: Maximum assistance Feeding assistance: Limited assistance Dressing Assistance: Maximum assistance     Functional Limitations Info  Sight, Hearing, Speech Sight Info: Adequate Hearing Info: Adequate Speech Info: Adequate    SPECIAL CARE FACTORS FREQUENCY  PT (By licensed PT), OT (By licensed OT)     PT Frequency: 5X/W OT Frequency: 5X/W            Contractures Contractures Info: Not present    Additional Factors Info  Code Status, Allergies Code Status Info: DNR Allergies Info: Sulfonamide Derivatives, Mirtazapine           Current Medications (10/06/2019):  This is the current hospital active medication list Current Facility-Administered Medications  Medication Dose Route Frequency Provider Last Rate Last Admin  . 0.9 %  sodium chloride infusion   Intravenous Continuous Georgette Shell, MD 75 mL/hr at 10/06/19 [id number] New Bag at 10/06/19 0212  . acetaminophen (TYLENOL) tablet 650 mg  650 mg Oral TID Georgette Shell, MD   650 mg at 10/06/19 [phone]  . alum & mag hydroxide-simeth (MAALOX/MYLANTA) 200-200-20 MG/5ML suspension 30 mL  30 mL Oral Q4H PRN Merlene Pulling K, PA-C      . aspirin EC tablet 81  mg  81 mg Oral Daily Ventura Bruns, PA-C   81 mg at 10/06/19 [id number]  . atorvastatin (LIPITOR) tablet 80 mg  80 mg Oral q1800 Merlene Pulling K, PA-C   80 mg at 10/05/19 1718  . bisacodyl (DULCOLAX) suppository 10 mg  10 mg Rectal Daily PRN Merlene Pulling K, PA-C      . docusate sodium (COLACE) capsule 100 mg  100 mg Oral BID Merlene Pulling K, PA-C   100 mg at 10/06/19 0959  . donepezil (ARICEPT) tablet 5 mg  5 mg Oral QHS  Merlene Pulling K, PA-C   5 mg at 10/05/19 2313  . enoxaparin (LOVENOX) injection 30 mg  30 mg Subcutaneous Q24H Merlene Pulling K, PA-C   30 mg at 10/06/19 [id number]  . escitalopram (LEXAPRO) tablet 10 mg  10 mg Oral QHS Merlene Pulling K, PA-C   10 mg at 10/05/19 2313  . feeding supplement (ENSURE ENLIVE) (ENSURE ENLIVE) liquid 237 mL  237 mL Oral BID BM Merlene Pulling K, PA-C   237 mL at 10/06/19 0959  . ferrous sulfate tablet 325 mg  325 mg Oral TID PC Brown, Blaine K, PA-C   325 mg at 10/06/19 1352  . hydrALAZINE (APRESOLINE) injection 10 mg  10 mg Intravenous Q6H PRN Merlene Pulling K, PA-C   10 mg at 10/03/19 [id number]  . levothyroxine (SYNTHROID) tablet 100 mcg  100 mcg Oral Q0600 Ventura Bruns, PA-C   100 mcg at 10/06/19 0601  . magnesium citrate solution 1 Bottle  1 Bottle Oral Once PRN Merlene Pulling K, PA-C      . menthol-cetylpyridinium (CEPACOL) lozenge 3 mg  1 lozenge Oral PRN Merlene Pulling K, PA-C       Or  . phenol (CHLORASEPTIC) mouth spray 1 spray  1 spray Mouth/Throat PRN Merlene Pulling K, PA-C      . metoprolol tartrate (LOPRESSOR) tablet 12.5 mg  12.5 mg Oral BID Georgette Shell, MD   12.5 mg at 10/06/19 [id number]  . mirtazapine (REMERON) tablet 7.5 mg  7.5 mg Oral QHS Merlene Pulling K, PA-C   7.5 mg at 10/05/19 2311  . multivitamin with minerals tablet 1 tablet  1 tablet Oral Daily Ventura Bruns, PA-C   1 tablet at 10/06/19 1352  . ondansetron (ZOFRAN) tablet 4 mg  4 mg Oral Q6H PRN Merlene Pulling K, PA-C       Or  . ondansetron [facility]) injection 4 mg  4 mg Intravenous Q6H PRN Merlene Pulling K, PA-C      . pantoprazole (PROTONIX) EC tablet 40 mg  40 mg Oral BID Merlene Pulling K, PA-C   40 mg at 10/06/19 [id number]  . polyethylene glycol (MIRALAX / GLYCOLAX) packet 17 g  17 g Oral Daily PRN Merlene Pulling K, PA-C      . senna (SENOKOT) tablet 8.6 mg  1 tablet Oral BID Merlene Pulling K, PA-C   8.6 mg at 10/06/19 0958  . traMADol (ULTRAM) tablet 50 mg  50 mg Oral Q6H Brown, Blaine K, PA-C   50 mg at  10/06/19 1352     Discharge Medications: Please see discharge summary for a list of discharge medications.  Relevant Imaging Results:  Relevant Lab Results:   Additional Information [place], [place]

## 2019-10-06 NOTE — Consult Note (Signed)
   [facility] CM Inpatient Consult   10/06/2019  ZITLALIC CANNIZZO August 10, 1921 [id number]   Patient screened for potential [facility] CM) services due to unplanned readmission risk score of 25% and multiple hospitalizations.   Per chart review, current disposition plan is for skilled nursing facility. No [facility] CM identifiable needs at this time.  Netta Cedars, MSN, [facility] Liaison Nurse Mobile Phone [phone]  Toll free office [phone]

## 2019-10-06 NOTE — Progress Notes (Signed)
PROGRESS NOTE    FREIDY BURDETTE  [id number] DOB: [birth year] DOA: 10/03/2019 PCP: [name], NP   Brief Narrative:86 y.o.femalewith medical history significant fordementia,hypertension,hypothyroidism and dyslipidemia who presented to the emergency room after a fall at home.PerEMS patient was found lying on her left side.No obvious deformities noted.An x-ray of the left hip was done in the ERwhich showed subtle linear lucency extending through the lateral cortex of the left femoral neck, age indeterminate, but could reflect an acute nondisplaced fracture.She had a CTscan of the left hip which showedacute comminuted intertrochanteric fracture of the left hip as above. I am unable to do review of systems due to patient's underlying dementia  ED Course:Patient presents afteramechanical fall. Shewas noted tohavesignificant tenderness to palpation of left hip and with range of motion. She is also keeping the leg externally rotated. Strong suspicion for a left hip fracture. X-ray does not show definitive evidence of fracture. I discussed the case with Dr. Mardelle Matte with orthopedic surgery. He requestedto get aCT left hip, if this is inconclusive then she will require MRI. Discussed this with patient and her son. 6:19 AM CT confirms left intertrochanteric fracture. Patient had no evidence of head injury. No other acute traumatic injury. Dr. Mardelle Matte was consulted for orthopedics. Admit to hospitalist.  Assessment & Plan:   Principal Problem:   Intertrochanteric fracture of left femur (HCC) Active Problems:   Hypothyroidism   Depression, recurrent (HCC)   Hip fracture (HCC)   Hypertensive urgency   Anemia of chronic disease   Dementia without behavioral disturbance (HCC)   Closed comminuted intertrochanteric fracture of proximal end of left femur (HCC)   Abrasion of left elbow   1 status post mechanical fall with left intertrochanteric fracture-status post  IM nailing 10/03/2019. Pain control with Tylenol PT consult recommends SNF  Patient son now agreeable for her to go to SNF. Discussed with toc Bowel regime Colace and senna DVT prophylaxis with Lovenox Covid SNF ordered 10/06/2019  2 essential hypertension with hypertensive urgency on admission-blood pressure 148/56.  Continue metoprolol 12.5 mg twice a day.    3 anemia of chronic disease hemoglobin 8.9 stable.    4 hypothyroidism continue Synthroid  5 dementia on Aricept  6 depression on Lexapro  7 hyperlipidemia on Lipitor  8 increased creatinine trending down DC IV fluids      Nutrition Problem: Increased nutrient needs Etiology: post-op healing     Signs/Symptoms: estimated needs    Interventions: Ensure Enlive (each supplement provides 350kcal and 20 grams of protein), Magic cup, MVI  Estimated body mass index is 22.14 kg/m as calculated from the following:   Height as of this encounter: 5\' 3"  (1.6 m).   Weight as of this encounter: 56.7 kg.   DVT prophylaxis:Lovenox  code Status:DO NOT RESUSCITATE  family Communication:dw son Disposition Plan: Patient came from home PT recommends SNF Plan will be to discharge to SNF Barriers to discharge none  Consultants:  ortho  Procedures:lefthip fracture status post IM nailing Antimicrobials:none  Subjective: Resting in bed   Objective: Vitals:   10/05/19 2158 10/06/19 0217 10/06/19 0532 10/06/19 0958  BP: (!) 149/42 (!) 119/48 (!) 151/48 (!) 148/56  Pulse: 71 75 68 70  Resp: 15  16   Temp: 97.8 F (36.6 C)  97.8 F (36.6 C)   TempSrc: Oral  Oral   SpO2: 96%  98%   Weight:      Height:        Intake/Output Summary (Last 24 hours) at 10/06/2019  [place] filed at 10/06/2019 0900 Gross per 24 hour  Intake 128 ml  Output 575 ml  Net -447 ml   Filed Weights   10/03/19 0153  Weight: 56.7 kg    Examination:  General exam: Appears calm and comfortable  Respiratory system:  Clear to auscultation. Respiratory effort normal. Cardiovascular system: S1 & S2 heard, RRR. No JVD, murmurs, rubs, gallops or clicks. No pedal edema. Gastrointestinal system: Abdomen is nondistended, soft and nontender. No organomegaly or masses felt. Normal bowel sounds heard. Central nervous system: Alert and oriented. No focal neurological deficits. Extremities: Trace left lower extremity edema left hip incision covered with a dressing which is clean and dry skin: No rashes, lesions or ulcers Psychiatry: Judgement and insight appear normal. Mood & affect appropriate.     Data Reviewed: I have personally reviewed following labs and imaging studies  CBC: Recent Labs  Lab 10/03/19 0518 10/04/19 0535 10/05/19 0534 10/06/19 0554  WBC 7.7 7.7 8.9 7.1  NEUTROABS 5.7  --   --   --   HGB 9.7* 8.0* 8.1* 8.9*  HCT 32.5* 27.6* 27.8* 30.8*  MCV 82.3 82.6 83.5 83.7  PLT 164 153 159 [id number]   Basic Metabolic Panel: Recent Labs  Lab 10/03/19 0518 10/04/19 0535 10/05/19 0534 10/06/19 0554  NA 144 139 135 137  K 4.4 4.2 4.8 4.5  CL 111 106 106 108  CO2 26 24 24  21*  GLUCOSE 134* 166* 124* 91  BUN 18 17 20 18   CREATININE 0.92 0.95 1.01* 0.91  CALCIUM 8.9 8.5* 8.7* 8.6*   GFR: Estimated Creatinine Clearance: 30.6 mL/min (by C-G formula based on SCr of 0.91 mg/dL). Liver Function Tests: No results for input(s): AST, ALT, ALKPHOS, BILITOT, PROT, ALBUMIN in the last 168 hours. No results for input(s): LIPASE, AMYLASE in the last 168 hours. No results for input(s): AMMONIA in the last 168 hours. Coagulation Profile: Recent Labs  Lab 10/03/19 0518  INR 1.0   Cardiac Enzymes: No results for input(s): CKTOTAL, CKMB, CKMBINDEX, TROPONINI in the last 168 hours. BNP (last 3 results) No results for input(s): PROBNP in the last 8760 hours. HbA1C: No results for input(s): HGBA1C in the last 72 hours. CBG: No results for input(s): GLUCAP in the last 168 hours. Lipid Profile: No results  for input(s): CHOL, HDL, LDLCALC, TRIG, CHOLHDL, LDLDIRECT in the last 72 hours. Thyroid Function Tests: No results for input(s): TSH, T4TOTAL, FREET4, T3FREE, THYROIDAB in the last 72 hours. Anemia Panel: No results for input(s): VITAMINB12, FOLATE, FERRITIN, TIBC, IRON, RETICCTPCT in the last 72 hours. Sepsis Labs: No results for input(s): PROCALCITON, LATICACIDVEN in the last 168 hours.  Recent Results (from the past 240 hour(s))  SARS CORONAVIRUS 2 (TAT 6-24 HRS) Nasopharyngeal Nasopharyngeal Swab     Status: None   Collection Time: 10/03/19  5:18 AM   Specimen: Nasopharyngeal Swab  Result Value Ref Range Status   SARS Coronavirus 2 NEGATIVE NEGATIVE Final    Comment: (NOTE) SARS-CoV-2 target nucleic acids are NOT DETECTED. The SARS-CoV-2 RNA is generally detectable in upper and lower respiratory specimens during the acute phase of infection. Negative results do not preclude SARS-CoV-2 infection, do not rule out co-infections with other pathogens, and should not be used as the sole basis for treatment or other patient management decisions. Negative results must be combined with clinical observations, patient history, and epidemiological information. The expected result is Negative. Fact Sheet for Patients: SugarRoll.be Fact Sheet for Healthcare Providers: https://www.woods-.com/ This test is not  yet approved or cleared by the [place] and  has been authorized for detection and/or diagnosis of SARS-CoV-2 by FDA under an Emergency Use Authorization (EUA). This EUA will remain  in effect (meaning this test can be used) for the duration of the COVID-19 declaration under Section 56 4(b)(1) of the Act, 21 U.S.C. section 360bbb-3(b)(1), unless the authorization is terminated or revoked sooner. Performed at [facility], [place] [address] [place], [place] [address]   Respiratory Panel by RT PCR (Flu A&B, Covid) -  Nasopharyngeal Swab     Status: None   Collection Time: 10/03/19  1:12 PM   Specimen: Nasopharyngeal Swab  Result Value Ref Range Status   SARS Coronavirus 2 by RT PCR NEGATIVE NEGATIVE Final    Comment: (NOTE) SARS-CoV-2 target nucleic acids are NOT DETECTED. The SARS-CoV-2 RNA is generally detectable in upper respiratoy specimens during the acute phase of infection. The lowest concentration of SARS-CoV-2 viral copies this assay can detect is 131 copies/mL. A negative result does not preclude SARS-Cov-2 infection and should not be used as the sole basis for treatment or other patient management decisions. A negative result may occur with  improper specimen collection/handling, submission of specimen other than nasopharyngeal swab, presence of viral mutation(s) within the areas targeted by this assay, and inadequate number of viral copies (<131 copies/mL). A negative result must be combined with clinical observations, patient history, and epidemiological information. The expected result is Negative. Fact Sheet for Patients:  PinkCheek.be Fact Sheet for Healthcare Providers:  GravelBags.it This test is not yet ap proved or cleared by the [place] FDA and  has been authorized for detection and/or diagnosis of SARS-CoV-2 by FDA under an Emergency Use Authorization (EUA). This EUA will remain  in effect (meaning this test can be used) for the duration of the COVID-19 declaration under Section 564(b)(1) of the Act, 21 U.S.C. section 360bbb-3(b)(1), unless the authorization is terminated or revoked sooner.    Influenza A by PCR NEGATIVE NEGATIVE Final   Influenza B by PCR NEGATIVE NEGATIVE Final    Comment: (NOTE) The Xpert Xpress SARS-CoV-2/FLU/RSV assay is intended as an aid in  the diagnosis of influenza from Nasopharyngeal swab specimens and  should not be used as a sole basis for treatment. Nasal washings and  aspirates  are unacceptable for Xpert Xpress SARS-CoV-2/FLU/RSV  testing. Fact Sheet for Patients: PinkCheek.be Fact Sheet for Healthcare Providers: GravelBags.it This test is not yet approved or cleared by the [place] FDA and  has been authorized for detection and/or diagnosis of SARS-CoV-2 by  FDA under an Emergency Use Authorization (EUA). This EUA will remain  in effect (meaning this test can be used) for the duration of the  Covid-19 declaration under Section 564(b)(1) of the Act, 21  U.S.C. section 360bbb-3(b)(1), unless the authorization is  terminated or revoked. Performed at [facility], [place] [address] [place], [place] [address]          Radiology Studies: No results found.      Scheduled Meds: . acetaminophen  650 mg Oral TID  . aspirin EC  81 mg Oral Daily  . atorvastatin  80 mg Oral q1800  . docusate sodium  100 mg Oral BID  . donepezil  5 mg Oral QHS  . enoxaparin (LOVENOX) injection  30 mg Subcutaneous Q24H  . escitalopram  10 mg Oral QHS  . feeding supplement (ENSURE ENLIVE)  237 mL Oral BID BM  . ferrous sulfate  325  mg Oral TID PC  . levothyroxine  100 mcg Oral Q0600  . metoprolol tartrate  12.5 mg Oral BID  . mirtazapine  7.5 mg Oral QHS  . multivitamin with minerals  1 tablet Oral Daily  . pantoprazole  40 mg Oral BID  . senna  1 tablet Oral BID  . traMADol  50 mg Oral Q6H   Continuous Infusions: . sodium chloride 75 mL/hr at 10/06/19 [id number]     LOS: 3 days     Georgette Shell, MD 10/06/2019, 1:13 PM

## 2019-10-06 NOTE — Progress Notes (Signed)
Physical Therapy Treatment Patient Details Name: Maria Clarke MRN: [id number] DOB: Sep 17, 1921 Today's Date: 10/06/2019    History of Present Illness 86 yo female with PMH of recent L occipital lobe CVA (05/2019), right carotid stenosis (60-79%), hypertension, hyperlipidemia, hypothyroidism, atrial tachycardia, CAD, depression, HCL, and dementia who presented from home s/p fall with L hip fx and now s/p IM nailing    PT Comments    POD # 3 Pt in bed resting with untouched lunch tray at bedside.  Peri wick in place but not secure.  General bed mobility comments: pt required Total Assist + 2 this session due to decreased alertness.  Pt found incont of urine as she was unaware.General transfer comment: pt  required increased assist due to impaired cognition/awareness.  Assisted from bed to [facility] + 2 asssit with 100% VC's due to pt's resistance.  Then assisted from [facility] to recliner + 3 assist for + 2 side by side support and third assist for peri care/hygiene. Pt positioned upright in recliner with multiple pillows for comfort.  Offered her tray and she declined.  Follow Up Recommendations  SNF     Equipment Recommendations  None recommended by PT    Recommendations for Other Services       Precautions / Restrictions Precautions Precautions: Fall Restrictions Weight Bearing Restrictions: No Other Position/Activity Restrictions: WBAT    Mobility  Bed Mobility Overal bed mobility: Needs Assistance Bed Mobility: Supine to Sit     Supine to sit: Total assist;+2 for physical assistance;+2 for safety/equipment     General bed mobility comments: pt required Total Assist + 2 this session due to decreased alertness.  Pt found incont of urine as she was unaware.  Transfers Overall transfer level: Needs assistance Equipment used: Rolling walker (2 wheeled);None Transfers: Sit to/from [facility] Sit to Stand: Total assist;+2 physical assistance;+2 safety/equipment Stand  pivot transfers: Total assist;+2 physical assistance;+2 safety/equipment       General transfer comment: pt  required increased assist due to impaired cognition/awareness.  Assisted from bed to [facility] + 2 asssit with 100% VC's due to pt's resistance.  Then assisted from [facility] to recliner + 3 assist for + 2 side by side support and third assist for peri care/hygiene.  Ambulation/Gait             General Gait Details: unable to attempt this session due to poor transfer ability.   Stairs             Wheelchair Mobility    Modified Rankin (Stroke Patients Only)       Balance                                            Cognition Arousal/Alertness: Lethargic   Overall Cognitive Status: Impaired/Different from baseline                                 General Comments: more lethargic/sleepy/groggy with eyes shut most of session.  Did briefly arouse enough to participate but eyes stayed shut most of session and pt responded simple repeat commands.      Exercises      General Comments        Pertinent Vitals/Pain Pain Assessment: Faces Faces Pain Scale: Hurts little more Pain Location: L hip with activity Pain Descriptors /  Indicators: Grimacing;Sore Pain Intervention(s): Monitored during session;Repositioned    Home Living                      Prior Function            PT Goals (current goals can now be found in the care plan section) Progress towards PT goals: Progressing toward goals    Frequency    Min 3X/week      PT Plan Current plan remains appropriate    Co-evaluation              AM-PAC PT "6 Clicks" Mobility   Outcome Measure  Help needed turning from your back to your side while in a flat bed without using bedrails?: Total Help needed moving from lying on your back to sitting on the side of a flat bed without using bedrails?: Total Help needed moving to and from a bed to a chair (including a  wheelchair)?: Total Help needed standing up from a chair using your arms (e.g., wheelchair or bedside chair)?: Total Help needed to walk in hospital room?: Total Help needed climbing 3-5 steps with a railing? : Total 6 Click Score: 6    End of Session Equipment Utilized During Treatment: Gait belt Activity Tolerance: Other (comment)(limited due to cognition) Patient left: in chair;with call bell/phone within reach;with chair alarm set Nurse Communication: Mobility status PT Visit Diagnosis: Difficulty in walking, not elsewhere classified (R26.2)     Time: 1535-1600 PT Time Calculation (min) (ACUTE ONLY): 25 min  Charges:  $Therapeutic Activity: 23-37 mins                     Rica Koyanagi  PTA Acute  Rehabilitation Services Pager      [phone] Office      [phone]

## 2019-10-07 LAB — SARS CORONAVIRUS 2 (TAT 6-24 HRS): SARS Coronavirus 2: NEGATIVE

## 2019-10-07 MED ORDER — DOCUSATE SODIUM 100 MG PO CAPS: 100.0000 mg | ORAL_CAPSULE | Freq: Two times a day (BID) | ORAL | 0 refills | Status: AC

## 2019-10-07 MED ORDER — ADULT MULTIVITAMIN W/MINERALS CH: 1.0000 | ORAL_TABLET | Freq: Every day | ORAL | Status: AC

## 2019-10-07 MED ORDER — ONDANSETRON HCL 4 MG PO TABS: 4.0000 mg | ORAL_TABLET | Freq: Four times a day (QID) | ORAL | 0 refills | Status: AC | PRN

## 2019-10-07 MED ORDER — FENTANYL CITRATE (PF) 100 MCG/2ML IJ SOLN: 25.0000 ug | Freq: Once | INTRAMUSCULAR | Status: DC

## 2019-10-07 MED ORDER — ACETAMINOPHEN 325 MG PO TABS: 650.0000 mg | ORAL_TABLET | Freq: Three times a day (TID) | ORAL | Status: AC

## 2019-10-07 MED ORDER — ASPIRIN 325 MG PO TABS: 325.0000 mg | ORAL_TABLET | Freq: Two times a day (BID) | ORAL | Status: AC

## 2019-10-07 MED ORDER — FERROUS SULFATE 325 (65 FE) MG PO TABS: 325.0000 mg | ORAL_TABLET | Freq: Three times a day (TID) | ORAL | 3 refills | Status: AC

## 2019-10-07 MED ORDER — SENNA 8.6 MG PO TABS: 1.0000 | ORAL_TABLET | Freq: Two times a day (BID) | ORAL | 0 refills | Status: AC

## 2019-10-07 NOTE — Progress Notes (Signed)
     Subjective: 4 Days Post-Op s/p Procedure(s): INTRAMEDULLARY (IM) NAIL FEMORAL   Patient sleeping supine in bed. Unable to answer questions as patient quickly falls back asleep after being woken up.   Objective:  PE: VITALS:   Vitals:   10/06/19 0958 10/06/19 1530 10/06/19 2017 10/07/19 0439  BP: (!) 148/56 (!) 151/45 (!) 136/57 (!) 150/60  Pulse: 70 71 99 72  Resp:   14 16  Temp:  98.1 F (36.7 C) 98.7 F (37.1 C) 97.9 F (36.6 C)  TempSrc:  Oral Oral Oral  SpO2:  97% 97% 96%  Weight:      Height:       General: Alert, no acute distress, sleeping. Respiratory:No use of accessory musculature.  [id number] is soft and non-tender Skin:Skin tear of left elbow covered with bandage, no drainage noted.  MSK: LLE - Incisions with no drainage. Able to move all toes of left foot. 2+ DP pulse.Unable to assess sensation due to dementia. No grunting with passive straight leg raise.    LABS  Results for orders placed or performed during the hospital encounter of 10/03/19 (from the past 24 hour(s))  SARS CORONAVIRUS 2 (TAT 6-24 HRS) Nasopharyngeal Nasopharyngeal Swab     Status: None   Collection Time: 10/06/19  4:41 PM   Specimen: Nasopharyngeal Swab  Result Value Ref Range   SARS Coronavirus 2 NEGATIVE NEGATIVE    No results found.  Assessment/Plan: Principal Problem:   Intertrochanteric fracture of left femur (HCC) Active Problems:   Hypothyroidism   Depression, recurrent (HCC)   Hip fracture (HCC)   Hypertensive urgency   Anemia of chronic disease   Dementia without behavioral disturbance (HCC)   Closed comminuted intertrochanteric fracture of proximal end of left femur (HCC)   Abrasion of left elbow    4 Days Post-Op s/p Procedure(s): INTRAMEDULLARY (IM) NAIL FEMORAL  Weightbearing:WBAT LLE Insicional and dressing care:PRN dressings changes, patient continues to pull off dressings.  VTE prophylaxis:Lovenox, SCD's Pain control:tylenol Follow - up  plan:in 2 weeks after discharge with Dr. Mardelle Matte Dispo: PTrecommending SNF, son agrees. Ok to discharge to SNF from orthopedic standpoint whenever medically ready and bed available.   Contact information:   Weekdays 8-5 [address], [place] [phone] A fter hours and holidays please check [email or web address] for group call information for [facility]  Ventura Bruns 10/07/2019, 8:21 AM

## 2019-10-07 NOTE — Discharge Summary (Signed)
Physician Discharge Summary  Maria Clarke [id number] DOB: 01/19/1922 DOA: 10/03/2019  PCP: [name], NP  Admit date: 10/03/2019 Discharge date: 10/07/2019  Admitted From: Home Disposition: Home Recommendations for Outpatient Follow-up:  1. Follow up with PCP in 1-2 weeks 2. Please obtain BMP/CBC in one week 3. Follow-up with Dr. Levy Sjogren after 2 weeks  Home Health: Yes Equipment/Devices: Hospital bed, lightweight wheelchair, [place] lift  Discharge Condition stable and improved CODE STATUS full code Diet recommendation: Cardiac diet  Brief/Interim Summary:86 y.o.femalewith medical history significant fordementia,hypertension,hypothyroidism and dyslipidemia who presented to the emergency room after a fall at home.PerEMS patient was found lying on her left side.No obvious deformities noted.An x-ray of the left hip was done in the ERwhich showed subtle linear lucency extending through the lateral cortex of the left femoral neck, age indeterminate, but could reflect an acute nondisplaced fracture.She had a CTscan of the left hip which showedacute comminuted intertrochanteric fracture of the left hip as above. I am unable to do review of systems due to patient's underlying dementia  ED Course:Patient presents afteramechanical fall. Shewas noted tohavesignificant tenderness to palpation of left hip and with range of motion. She is also keeping the leg externally rotated. Strong suspicion for a left hip fracture. X-ray does not show definitive evidence of fracture. I discussed the case with Dr. Mardelle Matte with orthopedic surgery. He requestedto get aCT left hip, if this is inconclusive then she will require MRI. Discussed this with patient and her son. 6:19 AM CT confirms left intertrochanteric fracture. Patient had no evidence of head injury. No other acute traumatic injury. Dr. Mardelle Matte was consulted for orthopedics. Admit to hospitalist.   Discharge  Diagnoses:  Principal Problem:   Intertrochanteric fracture of left femur (HCC) Active Problems:   Hypothyroidism   Depression, recurrent ([place])   Hip fracture ([place])   Hypertensive urgency   Anemia of chronic disease   Dementia without behavioral disturbance (HCC)   Closed comminuted intertrochanteric fracture of proximal end of left femur (HCC)   Abrasion of left elbow  1status post mechanical fall with left intertrochanteric fracture-status post IM nailing 10/03/2019. Pain control with Tylenol PT consultrecommends SNF but family declined SNF and wants to take her home with hospital bed, [place] lift and lightweight wheelchair.  Continue bowel regimen with Colace and senna.  DVT prophylaxis with aspirin 325 twice a day for 28 days.  Follow-up with Dr. Isaiah Blakes after 2 weeks.  2 essential hypertension with hypertensive urgency on admission-blood pressure 148/56.  Continue metoprolol 12.5 mg twice a day.    3 anemia of chronic disease hemoglobin 8.9 stable.    4 hypothyroidism continue Synthroid  5 dementia on Aricept  6 depression on Lexapro  7 hyperlipidemia on Lipitor Nutrition Problem: Increased nutrient needs Etiology: post-op healing    Signs/Symptoms: estimated needs     Interventions: Ensure Enlive (each supplement provides 350kcal and 20 grams of protein), Magic cup, MVI  Estimated body mass index is 22.14 kg/m as calculated from the following:   Height as of this encounter: 5\' 3"  (1.6 m).   Weight as of this encounter: 56.7 kg.  Discharge Instructions  Discharge Instructions    Diet - low sodium heart healthy   Complete by: As directed    Increase activity slowly   Complete by: As directed      Allergies as of 10/07/2019      Reactions   Sulfonamide Derivatives Other (See Comments)   "ORAL S.E." (family unaware what time means)  Mirtazapine Other (See Comments)   Caused much agitation, so it was STOPPED by family      Medication List    STOP  taking these medications   potassium chloride 20 MEQ packet Commonly known as: KLOR-CON     TAKE these medications   acetaminophen 325 MG tablet Commonly known as: TYLENOL Take 2 tablets (650 mg total) by mouth 3 (three) times daily.   aspirin EC 81 MG tablet Take 81 mg by mouth daily.   atorvastatin 80 MG tablet Commonly known as: LIPITOR Take 1 tablet (80 mg total) by mouth daily at 6 PM.   cyanocobalamin 1000 MCG/ML injection Commonly known as: (VITAMIN B-12) Inject 1 mL (1,000 mcg total) into the skin every 30 (thirty) days.   docusate sodium 100 MG capsule Commonly known as: COLACE Take 1 capsule (100 mg total) by mouth 2 (two) times daily.   donepezil 5 MG tablet Commonly known as: ARICEPT Take 1 tablet (5 mg total) by mouth at bedtime.   escitalopram 10 MG tablet Commonly known as: LEXAPRO Take 1 tablet (10 mg total) by mouth at bedtime.   ferrous sulfate 325 (65 FE) MG tablet Take 1 tablet (325 mg total) by mouth 3 (three) times daily after meals.   levothyroxine 100 MCG tablet Commonly known as: SYNTHROID Take 100 mcg by mouth daily before breakfast.   metoprolol tartrate 50 MG tablet Commonly known as: LOPRESSOR Take 1 tablet (50 mg total) by mouth 2 (two) times daily. TAKE 1 TABLET BY MOUTH TWICE A DAY   mirtazapine 7.5 MG tablet Commonly known as: REMERON Take 7.5 mg by mouth at bedtime.   multivitamin with minerals Tabs tablet Take 1 tablet by mouth daily.   ondansetron 4 MG tablet Commonly known as: ZOFRAN Take 1 tablet (4 mg total) by mouth every 6 (six) hours as needed for nausea.   pantoprazole 40 MG tablet Commonly known as: PROTONIX Take 1 tablet (40 mg total) by mouth 2 (two) times daily.   senna 8.6 MG Tabs tablet Commonly known as: SENOKOT Take 1 tablet (8.6 mg total) by mouth 2 (two) times daily.            Durable Medical Equipment  (From admission, onward)         Start     Ordered   10/07/19 1126  For home use only  DME lightweight manual wheelchair with seat cushion  Once    Comments: Patient suffers from hip fracture  which impairs their ability to perform daily activities of daily living like grooming dressing brushing  in the home.  A walker  will not resolve  issue with performing activities of daily living. A wheelchair will allow patient to safely perform daily activities. Patient is not able to propel themselves in the home using a standard weight wheelchair due to weakness and hip fracture  . Patient can self propel in the lightweight wheelchair. Length of need life long Accessories: elevating leg rests (ELRs), wheel locks, extensions and anti-tippers.   10/07/19 1127   10/06/19 1336  For home use only DME Hospital bed  Once    Question Answer Comment  Length of Need Lifetime   The above medical condition requires: Patient requires the ability to reposition frequently   Head must be elevated greater than: 45 degrees   Bed type Semi-electric   Hoyer Lift Yes      10/06/19 1336         Follow-up Information    Nafziger,  Tommi Rumps, NP Follow up.   Specialty: Family Medicine Contact information: [place] [address] [phone]          Allergies  Allergen Reactions  . Sulfonamide Derivatives Other (See Comments)    "ORAL S.E." (family unaware what time means)  . Mirtazapine Other (See Comments)    Caused much agitation, so it was STOPPED by family    Consultations: ortho  Procedures/Studies: DG Chest 1 View  Result Date: 10/03/2019 CLINICAL DATA:  Initial evaluation for acute hip pain, fracture suspected. EXAM: CHEST  1 VIEW COMPARISON:  Prior radiograph from 03/05/2013. FINDINGS: Mild cardiomegaly. Mediastinal silhouette within normal limits. Aortic atherosclerosis. Lungs normally inflated. Mild diffuse interstitial congestion without overt pulmonary edema. Superimposed small left pleural effusion with mild left basilar atelectasis. No focal infiltrates. No  pneumothorax. No acute osseous finding. Degenerative changes noted about the shoulders bilaterally. IMPRESSION: 1. Cardiomegaly with mild diffuse pulmonary interstitial congestion and small left pleural effusion. 2. Superimposed left basilar atelectasis. 3.  Aortic Atherosclerosis (ICD10-I70.0). Electronically Signed   By: Jeannine Boga M.D.   On: 10/03/2019 05:29   CT Hip Left Wo Contrast  Result Date: 10/03/2019 CLINICAL DATA:  Initial evaluation for acute left hip pain, fracture suspected. EXAM: CT OF THE LEFT HIP WITHOUT CONTRAST TECHNIQUE: Multidetector CT imaging of the left hip was performed according to the standard protocol. Multiplanar CT image reconstructions were also generated. COMPARISON:  None. FINDINGS: Bones/Joint/Cartilage There is an acute mildly comminuted intertrochanteric fracture of the proximal left femur. No significant displacement, impaction, or angulation. No subtrochanteric extension. Femoral head intact and remains normally situated. No associated acetabular fracture. Visualized pelvis and sacrum intact. Moderate osteoarthritic changes present at the left hip. Degenerative change noted at the left ischial tuberosity. No discrete or worrisome osseous lesions. Ligaments Suboptimally assessed by CT. Muscles and Tendons Scattered calcifications noted at the hamstring insertion at the ischial tuberosity, suggesting chronic calcific tendinopathy. Similar changes seen at the gluteal insertion. Soft tissues Mild diffuse edema about the acute left hip fracture. No joint effusion. Moderate retained stool seen within the visualized distal colon. Scattered colonic diverticulosis without evidence for acute diverticulitis. Mild circumferential bladder wall thickening likely related incomplete distension. Scattered vascular calcifications noted. IMPRESSION: Acute comminuted intertrochanteric fracture of the left hip as above. Electronically Signed   By: Jeannine Boga M.D.   On:  10/03/2019 05:36   DG Knee Complete 4 Views Left  Result Date: 10/03/2019 CLINICAL DATA:  Initial evaluation for acute pain status post fall. EXAM: LEFT KNEE - COMPLETE 4+ VIEW COMPARISON:  None available. FINDINGS: No acute fracture or dislocation. Probable small joint effusion noted within the suprapatellar recess. Moderate tricompartmental degenerative osteoarthrosis. Superimposed chondrocalcinosis. Underlying osteopenia. No visible soft tissue injury. Scattered vascular calcifications noted. IMPRESSION: 1. No acute osseous abnormality about the knee. 2. Moderate degenerative tricompartmental osteoarthrosis with associated small joint effusion. Electronically Signed   By: Jeannine Boga M.D.   On: 10/03/2019 03:33   DG C-Arm 1-60 Min-No Report  Result Date: 10/03/2019 Fluoroscopy was utilized by the requesting physician.  No radiographic interpretation.   DG HIP OPERATIVE UNILAT W OR W/O PELVIS LEFT  Result Date: 10/03/2019 CLINICAL DATA:  Left femoral intramedullary nail EXAM: OPERATIVE left HIP (WITH PELVIS IF PERFORMED) 2 VIEWS TECHNIQUE: Fluoroscopic spot image(s) were submitted for interpretation post-operatively. COMPARISON:  10/03/2019 FINDINGS: 4 fluoroscopic images are obtained during the performance of the procedure and are provided for interpretation only. Intramedullary rod, proximal dynamic, and distal interlocking screws are  identified traversing the intertrochanteric fracture seen previously. Alignment is near anatomic. FLUOROSCOPY TIME:  1 minutes IMPRESSION: 1. Intraoperative exam as above. Electronically Signed   By: Randa Ngo M.D.   On: 10/03/2019 17:50   DG Hip Unilat W or Wo Pelvis 2-3 Views Left  Result Date: 10/03/2019 CLINICAL DATA:  Initial evaluation for acute pain status post fall. EXAM: DG HIP (WITH OR WITHOUT PELVIS) 2-3V LEFT COMPARISON:  None. FINDINGS: On oblique view, there is question of a subtle lucency extending through the lateral cortex of the left  femoral neck, age indeterminate, but could reflect an acute nondisplaced fracture. No other acute fracture or dislocation. Femoral head remains normally position within the acetabulum. Femoral head height maintained. Bony pelvis intact. Advanced osteoarthritic changes about the hips bilaterally. Prominent degenerative spondylosis noted within the lower lumbar spine. No visible soft tissue injury. IMPRESSION: Subtle linear lucency extending through the lateral cortex of the left femoral neck, age indeterminate, but could reflect an acute nondisplaced fracture. Correlation with physical exam for possible pain at this location recommended. Further assessment with dedicated MRI could be performed for further evaluation as clinically warranted. Electronically Signed   By: Jeannine Boga M.D.   On: 10/03/2019 03:39    (Echo, Carotid, EGD, Colonoscopy, ERCP)    Subjective: Patient resting in bed in no acute distress  Discharge Exam: Vitals:   10/06/19 2017 10/07/19 0439  BP: (!) 136/57 (!) 150/60  Pulse: 99 72  Resp: 14 16  Temp: 98.7 F (37.1 C) 97.9 F (36.6 C)  SpO2: 97% 96%   Vitals:   10/06/19 0958 10/06/19 1530 10/06/19 2017 10/07/19 0439  BP: (!) 148/56 (!) 151/45 (!) 136/57 (!) 150/60  Pulse: 70 71 99 72  Resp:   14 16  Temp:  98.1 F (36.7 C) 98.7 F (37.1 C) 97.9 F (36.6 C)  TempSrc:  Oral Oral Oral  SpO2:  97% 97% 96%  Weight:      Height:        General: Pt is alert, awake, not in acute distress Cardiovascular: RRR, S1/S2 +, no rubs, no gallops Respiratory: CTA bilaterally, no wheezing, no rhonchi Abdominal: Soft, NT, ND, bowel sounds + Extremities: Left hip incision covered with dressing appears clean   The results of significant diagnostics from this hospitalization (including imaging, microbiology, ancillary and laboratory) are listed below for reference.     Microbiology: Recent Results (from the past 240 hour(s))  SARS CORONAVIRUS 2 (TAT 6-24 HRS)  Nasopharyngeal Nasopharyngeal Swab     Status: None   Collection Time: 10/03/19  5:18 AM   Specimen: Nasopharyngeal Swab  Result Value Ref Range Status   SARS Coronavirus 2 NEGATIVE NEGATIVE Final    Comment: (NOTE) SARS-CoV-2 target nucleic acids are NOT DETECTED. The SARS-CoV-2 RNA is generally detectable in upper and lower respiratory specimens during the acute phase of infection. Negative results do not preclude SARS-CoV-2 infection, do not rule out co-infections with other pathogens, and should not be used as the sole basis for treatment or other patient management decisions. Negative results must be combined with clinical observations, patient history, and epidemiological information. The expected result is Negative. Fact Sheet for Patients: SugarRoll.be Fact Sheet for Healthcare Providers: https://www.woods-.com/ This test is not yet approved or cleared by the [place] FDA and  has been authorized for detection and/or diagnosis of SARS-CoV-2 by FDA under an Emergency Use Authorization (EUA). This EUA will remain  in effect (meaning this test can be used) for the duration  of the COVID-19 declaration under Section 56 4(b)(1) of the Act, 21 U.S.C. section 360bbb-3(b)(1), unless the authorization is terminated or revoked sooner. Performed at [facility], [place] [address], [place], [place] [address]   Respiratory Panel by RT PCR (Flu A&B, Covid) - Nasopharyngeal Swab     Status: None   Collection Time: 10/03/19  1:12 PM   Specimen: Nasopharyngeal Swab  Result Value Ref Range Status   SARS Coronavirus 2 by RT PCR NEGATIVE NEGATIVE Final    Comment: (NOTE) SARS-CoV-2 target nucleic acids are NOT DETECTED. The SARS-CoV-2 RNA is generally detectable in upper respiratoy specimens during the acute phase of infection. The lowest concentration of SARS-CoV-2 viral copies this assay can detect is 131 copies/mL. A negative result  does not preclude SARS-Cov-2 infection and should not be used as the sole basis for treatment or other patient management decisions. A negative result may occur with  improper specimen collection/handling, submission of specimen other than nasopharyngeal swab, presence of viral mutation(s) within the areas targeted by this assay, and inadequate number of viral copies (<131 copies/mL). A negative result must be combined with clinical observations, patient history, and epidemiological information. The expected result is Negative. Fact Sheet for Patients:  PinkCheek.be Fact Sheet for Healthcare Providers:  GravelBags.it This test is not yet ap proved or cleared by the [place] FDA and  has been authorized for detection and/or diagnosis of SARS-CoV-2 by FDA under an Emergency Use Authorization (EUA). This EUA will remain  in effect (meaning this test can be used) for the duration of the COVID-19 declaration under Section 564(b)(1) of the Act, 21 U.S.C. section 360bbb-3(b)(1), unless the authorization is terminated or revoked sooner.    Influenza A by PCR NEGATIVE NEGATIVE Final   Influenza B by PCR NEGATIVE NEGATIVE Final    Comment: (NOTE) The Xpert Xpress SARS-CoV-2/FLU/RSV assay is intended as an aid in  the diagnosis of influenza from Nasopharyngeal swab specimens and  should not be used as a sole basis for treatment. Nasal washings and  aspirates are unacceptable for Xpert Xpress SARS-CoV-2/FLU/RSV  testing. Fact Sheet for Patients: PinkCheek.be Fact Sheet for Healthcare Providers: GravelBags.it This test is not yet approved or cleared by the [place] FDA and  has been authorized for detection and/or diagnosis of SARS-CoV-2 by  FDA under an Emergency Use Authorization (EUA). This EUA will remain  in effect (meaning this test can be used) for the duration of  the  Covid-19 declaration under Section 564(b)(1) of the Act, 21  U.S.C. section 360bbb-3(b)(1), unless the authorization is  terminated or revoked. Performed at [facility], [place] [address], [place], [place] [address]   SARS CORONAVIRUS 2 (TAT 6-24 HRS) Nasopharyngeal Nasopharyngeal Swab     Status: None   Collection Time: 10/06/19  4:41 PM   Specimen: Nasopharyngeal Swab  Result Value Ref Range Status   SARS Coronavirus 2 NEGATIVE NEGATIVE Final    Comment: (NOTE) SARS-CoV-2 target nucleic acids are NOT DETECTED. The SARS-CoV-2 RNA is generally detectable in upper and lower respiratory specimens during the acute phase of infection. Negative results do not preclude SARS-CoV-2 infection, do not rule out co-infections with other pathogens, and should not be used as the sole basis for treatment or other patient management decisions. Negative results must be combined with clinical observations, patient history, and epidemiological information. The expected result is Negative. Fact Sheet for Patients: SugarRoll.be Fact Sheet for Healthcare Providers: https://www.woods-.com/ This test is not yet approved or cleared  by the [place] and  has been authorized for detection and/or diagnosis of SARS-CoV-2 by FDA under an Emergency Use Authorization (EUA). This EUA will remain  in effect (meaning this test can be used) for the duration of the COVID-19 declaration under Section 56 4(b)(1) of the Act, 21 U.S.C. section 360bbb-3(b)(1), unless the authorization is terminated or revoked sooner. Performed at [facility], [place] [address], [place], [place] [address]      Labs: BNP (last 3 results) No results for input(s): BNP in the last 8760 hours. Basic Metabolic Panel: Recent Labs  Lab 10/03/19 0518 10/04/19 0535 10/05/19 0534 10/06/19 0554  NA 144 139 135 137  K 4.4 4.2 4.8 4.5  CL 111 106 106 108  CO2  26 24 24  21*  GLUCOSE 134* 166* 124* 91  BUN 18 17 20 18   CREATININE 0.92 0.95 1.01* 0.91  CALCIUM 8.9 8.5* 8.7* 8.6*   Liver Function Tests: No results for input(s): AST, ALT, ALKPHOS, BILITOT, PROT, ALBUMIN in the last 168 hours. No results for input(s): LIPASE, AMYLASE in the last 168 hours. No results for input(s): AMMONIA in the last 168 hours. CBC: Recent Labs  Lab 10/03/19 0518 10/04/19 0535 10/05/19 0534 10/06/19 0554  WBC 7.7 7.7 8.9 7.1  NEUTROABS 5.7  --   --   --   HGB 9.7* 8.0* 8.1* 8.9*  HCT 32.5* 27.6* 27.8* 30.8*  MCV 82.3 82.6 83.5 83.7  PLT 164 153 159 196   Cardiac Enzymes: No results for input(s): CKTOTAL, CKMB, CKMBINDEX, TROPONINI in the last 168 hours. BNP: Invalid input(s): POCBNP CBG: No results for input(s): GLUCAP in the last 168 hours. D-Dimer No results for input(s): DDIMER in the last 72 hours. Hgb A1c No results for input(s): HGBA1C in the last 72 hours. Lipid Profile No results for input(s): CHOL, HDL, LDLCALC, TRIG, CHOLHDL, LDLDIRECT in the last 72 hours. Thyroid function studies No results for input(s): TSH, T4TOTAL, T3FREE, THYROIDAB in the last 72 hours.  Invalid input(s): FREET3 Anemia work up No results for input(s): VITAMINB12, FOLATE, FERRITIN, TIBC, IRON, RETICCTPCT in the last 72 hours. Urinalysis    Component Value Date/Time   COLORURINE YELLOW 05/12/2019 1243   APPEARANCEUR CLEAR 05/12/2019 1243   LABSPEC 1.033 (H) 05/12/2019 1243   PHURINE 6.0 05/12/2019 1243   GLUCOSEU NEGATIVE 05/12/2019 1243   HGBUR SMALL (A) 05/12/2019 1243   HGBUR 1+ 04/13/2010 0934   BILIRUBINUR NEGATIVE 05/12/2019 1243   BILIRUBINUR N 12/05/2016 1213   KETONESUR NEGATIVE 05/12/2019 1243   PROTEINUR NEGATIVE 05/12/2019 1243   UROBILINOGEN 0.2 12/05/2016 1213   UROBILINOGEN 0.2 04/13/2010 0934   NITRITE NEGATIVE 05/12/2019 1243   LEUKOCYTESUR NEGATIVE 05/12/2019 1243   Sepsis Labs Invalid input(s): PROCALCITONIN,  WBC,   LACTICIDVEN Microbiology Recent Results (from the past 240 hour(s))  SARS CORONAVIRUS 2 (TAT 6-24 HRS) Nasopharyngeal Nasopharyngeal Swab     Status: None   Collection Time: 10/03/19  5:18 AM   Specimen: Nasopharyngeal Swab  Result Value Ref Range Status   SARS Coronavirus 2 NEGATIVE NEGATIVE Final    Comment: (NOTE) SARS-CoV-2 target nucleic acids are NOT DETECTED. The SARS-CoV-2 RNA is generally detectable in upper and lower respiratory specimens during the acute phase of infection. Negative results do not preclude SARS-CoV-2 infection, do not rule out co-infections with other pathogens, and should not be used as the sole basis for treatment or other patient management decisions. Negative results must be combined with clinical observations, patient history, and  epidemiological information. The expected result is Negative. Fact Sheet for Patients: SugarRoll.be Fact Sheet for Healthcare Providers: https://www.woods-.com/ This test is not yet approved or cleared by the [place] FDA and  has been authorized for detection and/or diagnosis of SARS-CoV-2 by FDA under an Emergency Use Authorization (EUA). This EUA will remain  in effect (meaning this test can be used) for the duration of the COVID-19 declaration under Section 56 4(b)(1) of the Act, 21 U.S.C. section 360bbb-3(b)(1), unless the authorization is terminated or revoked sooner. Performed at [facility], [place] [address], [place], [place] [address]   Respiratory Panel by RT PCR (Flu A&B, Covid) - Nasopharyngeal Swab     Status: None   Collection Time: 10/03/19  1:12 PM   Specimen: Nasopharyngeal Swab  Result Value Ref Range Status   SARS Coronavirus 2 by RT PCR NEGATIVE NEGATIVE Final    Comment: (NOTE) SARS-CoV-2 target nucleic acids are NOT DETECTED. The SARS-CoV-2 RNA is generally detectable in upper respiratoy specimens during the acute phase of infection. The  lowest concentration of SARS-CoV-2 viral copies this assay can detect is 131 copies/mL. A negative result does not preclude SARS-Cov-2 infection and should not be used as the sole basis for treatment or other patient management decisions. A negative result may occur with  improper specimen collection/handling, submission of specimen other than nasopharyngeal swab, presence of viral mutation(s) within the areas targeted by this assay, and inadequate number of viral copies (<131 copies/mL). A negative result must be combined with clinical observations, patient history, and epidemiological information. The expected result is Negative. Fact Sheet for Patients:  PinkCheek.be Fact Sheet for Healthcare Providers:  GravelBags.it This test is not yet ap proved or cleared by the [place] FDA and  has been authorized for detection and/or diagnosis of SARS-CoV-2 by FDA under an Emergency Use Authorization (EUA). This EUA will remain  in effect (meaning this test can be used) for the duration of the COVID-19 declaration under Section 564(b)(1) of the Act, 21 U.S.C. section 360bbb-3(b)(1), unless the authorization is terminated or revoked sooner.    Influenza A by PCR NEGATIVE NEGATIVE Final   Influenza B by PCR NEGATIVE NEGATIVE Final    Comment: (NOTE) The Xpert Xpress SARS-CoV-2/FLU/RSV assay is intended as an aid in  the diagnosis of influenza from Nasopharyngeal swab specimens and  should not be used as a sole basis for treatment. Nasal washings and  aspirates are unacceptable for Xpert Xpress SARS-CoV-2/FLU/RSV  testing. Fact Sheet for Patients: PinkCheek.be Fact Sheet for Healthcare Providers: GravelBags.it This test is not yet approved or cleared by the [place] FDA and  has been authorized for detection and/or diagnosis of SARS-CoV-2 by  FDA under an Emergency  Use Authorization (EUA). This EUA will remain  in effect (meaning this test can be used) for the duration of the  Covid-19 declaration under Section 564(b)(1) of the Act, 21  U.S.C. section 360bbb-3(b)(1), unless the authorization is  terminated or revoked. Performed at [facility], [place] [address], [place], [place] [address]   SARS CORONAVIRUS 2 (TAT 6-24 HRS) Nasopharyngeal Nasopharyngeal Swab     Status: None   Collection Time: 10/06/19  4:41 PM   Specimen: Nasopharyngeal Swab  Result Value Ref Range Status   SARS Coronavirus 2 NEGATIVE NEGATIVE Final    Comment: (NOTE) SARS-CoV-2 target nucleic acids are NOT DETECTED. The SARS-CoV-2 RNA is generally detectable in upper and lower respiratory specimens during the acute phase of infection. Negative results  do not preclude SARS-CoV-2 infection, do not rule out co-infections with other pathogens, and should not be used as the sole basis for treatment or other patient management decisions. Negative results must be combined with clinical observations, patient history, and epidemiological information. The expected result is Negative. Fact Sheet for Patients: SugarRoll.be Fact Sheet for Healthcare Providers: https://www.woods-Robbie Nangle.com/ This test is not yet approved or cleared by the [place] FDA and  has been authorized for detection and/or diagnosis of SARS-CoV-2 by FDA under an Emergency Use Authorization (EUA). This EUA will remain  in effect (meaning this test can be used) for the duration of the COVID-19 declaration under Section 56 4(b)(1) of the Act, 21 U.S.C. section 360bbb-3(b)(1), unless the authorization is terminated or revoked sooner. Performed at [facility], [place] [address] [place], [place] [address]      Time coordinating discharge: 3 32minutes  SIGNED:   Georgette Shell, MD  [facility] 10/07/2019, 12:00 PM Pager   If  7PM-7AM, please contact night-coverage [email or web address] Password [email or web address]

## 2019-10-07 NOTE — TOC Transition Note (Signed)
Transition of Care [facility]) - CM/SW Discharge Note   Patient Details  Name: Maria Clarke MRN: [id number] Date of Birth: [birth year]  [facility] CM/SW Contact:  Trish Mage, LCSW Phone Number: 10/07/2019, 11:42 AM   Clinical Narrative:   Son has decided to take patient home.  Orders for hoyer lift, hospital bed, wheelchair and PT seen and appreciated.  Contacted Zach with [employer] for delivery.  Updated son, who will call me after he receives notification of delivery, at which point [facility] will be called for transportation.  Margorie John has caregivers lined up to receive patient upon arrival home. Contacted Karen with [facility] for [facility] PT. Referral accepted. TOC sign off.    Final next level of care: [place] Barriers to Discharge: Barriers Resolved   Patient Goals and CMS Choice     Choice offered to / list presented to : Adult Children  Discharge Placement                       Discharge Plan and Services   Discharge Planning Services: CM Consult Post Acute Care Choice: Skilled Nursing Facility                               Social Determinants of Health (SDOH) Interventions     Readmission Risk Interventions No flowsheet data found.

## 2019-10-07 NOTE — Progress Notes (Signed)
Maria Clarke Housekeeper to be D/C'd Home with Home Health per MD order.  Discussed prescriptions and follow up appointments with the patient. Prescriptions given to patient, medication list explained in detail. Pt verbalized understanding.  Allergies as of 10/07/2019      Reactions   Sulfonamide Derivatives Other (See Comments)   "ORAL S.E." (family unaware what time means)   Mirtazapine Other (See Comments)   Caused much agitation, so it was STOPPED by family      Medication List    STOP taking these medications   aspirin EC 81 MG tablet Replaced by: aspirin 325 MG tablet   potassium chloride 20 MEQ packet Commonly known as: KLOR-CON     TAKE these medications   acetaminophen 325 MG tablet Commonly known as: TYLENOL Take 2 tablets (650 mg total) by mouth 3 (three) times daily.   aspirin 325 MG tablet Commonly known as: Bayer Aspirin Take 1 tablet (325 mg total) by mouth 2 (two) times daily for 28 days. Replaces: aspirin EC 81 MG tablet   atorvastatin 80 MG tablet Commonly known as: LIPITOR Take 1 tablet (80 mg total) by mouth daily at 6 PM.   cyanocobalamin 1000 MCG/ML injection Commonly known as: (VITAMIN B-12) Inject 1 mL (1,000 mcg total) into the skin every 30 (thirty) days.   docusate sodium 100 MG capsule Commonly known as: COLACE Take 1 capsule (100 mg total) by mouth 2 (two) times daily.   donepezil 5 MG tablet Commonly known as: ARICEPT Take 1 tablet (5 mg total) by mouth at bedtime.   escitalopram 10 MG tablet Commonly known as: LEXAPRO Take 1 tablet (10 mg total) by mouth at bedtime.   ferrous sulfate 325 (65 FE) MG tablet Take 1 tablet (325 mg total) by mouth 3 (three) times daily after meals.   levothyroxine 100 MCG tablet Commonly known as: SYNTHROID Take 100 mcg by mouth daily before breakfast.   metoprolol tartrate 50 MG tablet Commonly known as: LOPRESSOR Take 1 tablet (50 mg total) by mouth 2 (two) times daily. TAKE 1 TABLET BY MOUTH TWICE A DAY    mirtazapine 7.5 MG tablet Commonly known as: REMERON Take 7.5 mg by mouth at bedtime.   multivitamin with minerals Tabs tablet Take 1 tablet by mouth daily.   ondansetron 4 MG tablet Commonly known as: ZOFRAN Take 1 tablet (4 mg total) by mouth every 6 (six) hours as needed for nausea.   pantoprazole 40 MG tablet Commonly known as: PROTONIX Take 1 tablet (40 mg total) by mouth 2 (two) times daily.   senna 8.6 MG Tabs tablet Commonly known as: SENOKOT Take 1 tablet (8.6 mg total) by mouth 2 (two) times daily.            Durable Medical Equipment  (From admission, onward)         Start     Ordered   10/07/19 1126  For home use only DME lightweight manual wheelchair with seat cushion  Once    Comments: Patient suffers from hip fracture  which impairs their ability to perform daily activities of daily living like grooming dressing brushing  in the home.  A walker  will not resolve  issue with performing activities of daily living. A wheelchair will allow patient to safely perform daily activities. Patient is not able to propel themselves in the home using a standard weight wheelchair due to weakness and hip fracture  . Patient can self propel in the lightweight wheelchair. Length of need life  long Accessories: elevating leg rests (ELRs), wheel locks, extensions and anti-tippers.   10/07/19 1127   10/06/19 1336  For home use only DME Hospital bed  Once    Question Answer Comment  Length of Need Lifetime   The above medical condition requires: Patient requires the ability to reposition frequently   Head must be elevated greater than: 45 degrees   Bed type Semi-electric   [place]arke Lift Yes      10/06/19 1336          Vitals:   10/06/19 2017 10/07/19 0439  BP: (!) 136/57 (!) 150/60  Pulse: 99 72  Resp: 14 16  Temp: 98.7 F (37.1 C) 97.9 F (36.6 C)  SpO2: 97% 96%    Skin clean, dry and intact without evidence of skin break down, no evidence of skin tears noted. IV  catheter discontinued intact. Site without signs and symptoms of complications. Dressing and pressure applied. Pt denies pain at this time. No complaints noted.  An After Visit Summary was printed and given to PTAR. Patient to transfer home via [place].   Maria Clarke S 10/07/2019 4:02 PM

## 2019-10-07 NOTE — Clinical Social Work Note (Signed)
    Durable Medical Equipment  (From admission, onward)         Start     Ordered   10/07/19 1126  For home use only DME lightweight manual wheelchair with seat cushion  Once    Comments: Patient suffers from hip fracture  which impairs their ability to perform daily activities of daily living like grooming dressing brushing  in the home.  A walker  will not resolve  issue with performing activities of daily living. A wheelchair will allow patient to safely perform daily activities. Patient is not able to propel themselves in the home using a standard weight wheelchair due to weakness and hip fracture  . Patient can self propel in the lightweight wheelchair. Length of need life long Accessories: elevating leg rests (ELRs), wheel locks, extensions and anti-tippers.   10/07/19 1127   10/06/19 1336  For home use only DME Hospital bed  Once    Question Answer Comment  Length of Need Lifetime   The above medical condition requires: Patient requires the ability to reposition frequently   Head must be elevated greater than: 45 degrees   Bed type Semi-electric   Hoyer Lift Yes      10/06/19 1336

## 2019-10-07 NOTE — Plan of Care (Signed)

## 2019-10-08 ENCOUNTER — Telehealth: Payer: Self-pay | Admitting: Adult Health

## 2019-10-08 DIAGNOSIS — S72142A Displaced intertrochanteric fracture of left femur, initial encounter for closed fracture: Secondary | ICD-10-CM | POA: Diagnosis not present

## 2019-10-08 NOTE — Telephone Encounter (Signed)
Maria Clarke from [place] is needing verbal orders for physical therapy, pt has left hip fracture. 2 times a week for 8 weeks.   Phone: [phone]  Ok to leave vm.

## 2019-10-08 NOTE — Telephone Encounter (Signed)
Maria Clarke notified to proceed with orders.  Nothing further needed.

## 2019-10-08 NOTE — Telephone Encounter (Signed)
Ok for verbal orders ?

## 2019-10-12 ENCOUNTER — Telehealth: Payer: Self-pay | Admitting: *Deleted

## 2019-10-12 NOTE — Progress Notes (Signed)
I agree with the above plan 

## 2019-10-12 NOTE — Telephone Encounter (Signed)
Patient son called the after hours line. Son reports his mother needs some pain medication and he called yesterday but the pharmacy never got it. Uses the [facility] on [address] and [address]

## 2019-10-13 ENCOUNTER — Telehealth: Payer: Self-pay | Admitting: Adult Health

## 2019-10-13 NOTE — Telephone Encounter (Signed)
Form Faxed and received confirmation the transmission was successful.

## 2019-10-13 NOTE — Telephone Encounter (Signed)
Maria Clarke notified that orders for Hospice have been sent.  He will call if he has not heard from them in a few days.  Faxed to [phone].  Hospice phone number is [phone] (referral center).  Nothing further needed.

## 2019-10-13 NOTE — Telephone Encounter (Signed)
The family caregiver said that's not eating or drinking since she came home from the hospital and the family took her off all her medications besides Tramadol    Resting HR125/130  Oxygen 85% room air  Hospice ready  She's declining   [name] and [name] are the sons that are with the patient  Therese Sarah [id number] [facility]  Please contact Therese Sarah for further questions

## 2019-10-13 NOTE — Telephone Encounter (Signed)
Hospice form filled out and waiting on signature

## 2019-10-13 NOTE — Telephone Encounter (Signed)
Spoke to [place] and advised of the below message.  Maria Clarke reports the pt has declined over the weekend.  She is in a weakened state and not taking in solids.  Maria Clarke says he thinks she is having another mini stroke.  Maria Clarke reports having palliative care.  Family will not be sending her to the hospital stating they will "ride it out because she has had enough of all of that."  Nothing further needed.  Will forward to [facility] as [place].

## 2019-10-13 NOTE — Telephone Encounter (Signed)
I would have her try Motrin 400 mg every 6 hours PRN or Tylenol 500 mg every 8 hours PRN first

## 2019-10-13 NOTE — Telephone Encounter (Signed)
Spoke to [employer] at [facility] and advised she call [name] on cell at [phone].  Nothing further needed.

## 2019-10-14 ENCOUNTER — Telehealth: Payer: Self-pay

## 2019-10-14 DIAGNOSIS — E039 Hypothyroidism, unspecified: Secondary | ICD-10-CM | POA: Diagnosis not present

## 2019-10-14 DIAGNOSIS — E538 Deficiency of other specified B group vitamins: Secondary | ICD-10-CM | POA: Diagnosis not present

## 2019-10-14 DIAGNOSIS — F0150 Vascular dementia without behavioral disturbance: Secondary | ICD-10-CM | POA: Diagnosis not present

## 2019-10-14 DIAGNOSIS — F329 Major depressive disorder, single episode, unspecified: Secondary | ICD-10-CM | POA: Diagnosis not present

## 2019-10-14 DIAGNOSIS — K219 Gastro-esophageal reflux disease without esophagitis: Secondary | ICD-10-CM | POA: Diagnosis not present

## 2019-10-14 DIAGNOSIS — I10 Essential (primary) hypertension: Secondary | ICD-10-CM | POA: Diagnosis not present

## 2019-10-14 DIAGNOSIS — E785 Hyperlipidemia, unspecified: Secondary | ICD-10-CM | POA: Diagnosis not present

## 2019-10-14 DIAGNOSIS — S72002D Fracture of unspecified part of neck of left femur, subsequent encounter for closed fracture with routine healing: Secondary | ICD-10-CM | POA: Diagnosis not present

## 2019-10-14 DIAGNOSIS — I69318 Other symptoms and signs involving cognitive functions following cerebral infarction: Secondary | ICD-10-CM | POA: Diagnosis not present

## 2019-10-14 NOTE — Telephone Encounter (Signed)
Telephone call to patients son [name] to confirm visit for next week.  Patient suffered a hip fracture and returned home.  Son states patient is being followed by hospice.  RN emailed palliative care administrative team and SW to update that patient is being followed by hospice.

## 2019-10-15 DIAGNOSIS — F0150 Vascular dementia without behavioral disturbance: Secondary | ICD-10-CM | POA: Diagnosis not present

## 2019-10-15 DIAGNOSIS — F329 Major depressive disorder, single episode, unspecified: Secondary | ICD-10-CM | POA: Diagnosis not present

## 2019-10-15 DIAGNOSIS — I69318 Other symptoms and signs involving cognitive functions following cerebral infarction: Secondary | ICD-10-CM | POA: Diagnosis not present

## 2019-10-15 DIAGNOSIS — E538 Deficiency of other specified B group vitamins: Secondary | ICD-10-CM | POA: Diagnosis not present

## 2019-10-15 DIAGNOSIS — I10 Essential (primary) hypertension: Secondary | ICD-10-CM | POA: Diagnosis not present

## 2019-10-15 DIAGNOSIS — E785 Hyperlipidemia, unspecified: Secondary | ICD-10-CM | POA: Diagnosis not present

## 2019-10-16 DIAGNOSIS — E538 Deficiency of other specified B group vitamins: Secondary | ICD-10-CM | POA: Diagnosis not present

## 2019-10-16 DIAGNOSIS — E785 Hyperlipidemia, unspecified: Secondary | ICD-10-CM | POA: Diagnosis not present

## 2019-10-16 DIAGNOSIS — F0150 Vascular dementia without behavioral disturbance: Secondary | ICD-10-CM | POA: Diagnosis not present

## 2019-10-16 DIAGNOSIS — I10 Essential (primary) hypertension: Secondary | ICD-10-CM | POA: Diagnosis not present

## 2019-10-16 DIAGNOSIS — F329 Major depressive disorder, single episode, unspecified: Secondary | ICD-10-CM | POA: Diagnosis not present

## 2019-10-16 DIAGNOSIS — I69318 Other symptoms and signs involving cognitive functions following cerebral infarction: Secondary | ICD-10-CM | POA: Diagnosis not present

## 2019-10-19 DIAGNOSIS — F0150 Vascular dementia without behavioral disturbance: Secondary | ICD-10-CM | POA: Diagnosis not present

## 2019-10-19 DIAGNOSIS — E538 Deficiency of other specified B group vitamins: Secondary | ICD-10-CM | POA: Diagnosis not present

## 2019-10-19 DIAGNOSIS — I10 Essential (primary) hypertension: Secondary | ICD-10-CM | POA: Diagnosis not present

## 2019-10-19 DIAGNOSIS — I69318 Other symptoms and signs involving cognitive functions following cerebral infarction: Secondary | ICD-10-CM | POA: Diagnosis not present

## 2019-10-19 DIAGNOSIS — F329 Major depressive disorder, single episode, unspecified: Secondary | ICD-10-CM | POA: Diagnosis not present

## 2019-10-19 DIAGNOSIS — E785 Hyperlipidemia, unspecified: Secondary | ICD-10-CM | POA: Diagnosis not present

## 2019-10-20 ENCOUNTER — Other Ambulatory Visit: Payer: Medicare Other

## 2019-10-20 DIAGNOSIS — E538 Deficiency of other specified B group vitamins: Secondary | ICD-10-CM | POA: Diagnosis not present

## 2019-10-20 DIAGNOSIS — F329 Major depressive disorder, single episode, unspecified: Secondary | ICD-10-CM | POA: Diagnosis not present

## 2019-10-20 DIAGNOSIS — E785 Hyperlipidemia, unspecified: Secondary | ICD-10-CM | POA: Diagnosis not present

## 2019-10-20 DIAGNOSIS — I69318 Other symptoms and signs involving cognitive functions following cerebral infarction: Secondary | ICD-10-CM | POA: Diagnosis not present

## 2019-10-20 DIAGNOSIS — I10 Essential (primary) hypertension: Secondary | ICD-10-CM | POA: Diagnosis not present

## 2019-10-20 DIAGNOSIS — F0150 Vascular dementia without behavioral disturbance: Secondary | ICD-10-CM | POA: Diagnosis not present

## 2019-10-21 DIAGNOSIS — I69318 Other symptoms and signs involving cognitive functions following cerebral infarction: Secondary | ICD-10-CM | POA: Diagnosis not present

## 2019-10-21 DIAGNOSIS — E785 Hyperlipidemia, unspecified: Secondary | ICD-10-CM | POA: Diagnosis not present

## 2019-10-21 DIAGNOSIS — F0150 Vascular dementia without behavioral disturbance: Secondary | ICD-10-CM | POA: Diagnosis not present

## 2019-10-21 DIAGNOSIS — I10 Essential (primary) hypertension: Secondary | ICD-10-CM | POA: Diagnosis not present

## 2019-10-21 DIAGNOSIS — E538 Deficiency of other specified B group vitamins: Secondary | ICD-10-CM | POA: Diagnosis not present

## 2019-10-21 DIAGNOSIS — F329 Major depressive disorder, single episode, unspecified: Secondary | ICD-10-CM | POA: Diagnosis not present

## 2019-10-23 DIAGNOSIS — I69318 Other symptoms and signs involving cognitive functions following cerebral infarction: Secondary | ICD-10-CM | POA: Diagnosis not present

## 2019-10-23 DIAGNOSIS — I10 Essential (primary) hypertension: Secondary | ICD-10-CM | POA: Diagnosis not present

## 2019-10-23 DIAGNOSIS — E538 Deficiency of other specified B group vitamins: Secondary | ICD-10-CM | POA: Diagnosis not present

## 2019-10-23 DIAGNOSIS — F329 Major depressive disorder, single episode, unspecified: Secondary | ICD-10-CM | POA: Diagnosis not present

## 2019-10-23 DIAGNOSIS — F0150 Vascular dementia without behavioral disturbance: Secondary | ICD-10-CM | POA: Diagnosis not present

## 2019-10-23 DIAGNOSIS — E785 Hyperlipidemia, unspecified: Secondary | ICD-10-CM | POA: Diagnosis not present

## 2019-10-25 DIAGNOSIS — I69318 Other symptoms and signs involving cognitive functions following cerebral infarction: Secondary | ICD-10-CM | POA: Diagnosis not present

## 2019-10-25 DIAGNOSIS — E538 Deficiency of other specified B group vitamins: Secondary | ICD-10-CM | POA: Diagnosis not present

## 2019-10-25 DIAGNOSIS — F329 Major depressive disorder, single episode, unspecified: Secondary | ICD-10-CM | POA: Diagnosis not present

## 2019-10-25 DIAGNOSIS — F0150 Vascular dementia without behavioral disturbance: Secondary | ICD-10-CM | POA: Diagnosis not present

## 2019-10-25 DIAGNOSIS — I10 Essential (primary) hypertension: Secondary | ICD-10-CM | POA: Diagnosis not present

## 2019-10-25 DIAGNOSIS — E785 Hyperlipidemia, unspecified: Secondary | ICD-10-CM | POA: Diagnosis not present

## 2019-10-28 ENCOUNTER — Telehealth: Payer: Self-pay | Admitting: Adult Health

## 2019-10-28 NOTE — Telephone Encounter (Signed)
Received notification from [facility] that Maria Clarke passed away on 10/29/2019

## 2019-11-07 DEATH — deceased

## 2019-12-08 DEATH — deceased

## 2020-02-26 IMAGING — CT CT HEAD W/O CM
4 series · 17 of 47 positions shown, 19 images · non-contrast
Comparison: None.

CLINICAL DATA: Altered level of consciousness

EXAM:
CT HEAD WITHOUT CONTRAST
TECHNIQUE: Contiguous axial images were obtained from the base of the skull
through the vertex without intravenous contrast.

[Series 3: head without · axial · non-contrast · 0.44mm/px · z∈[-59,+61]mm · 7 of 34 slices shown, 9 images]
[im 5/34  brain]
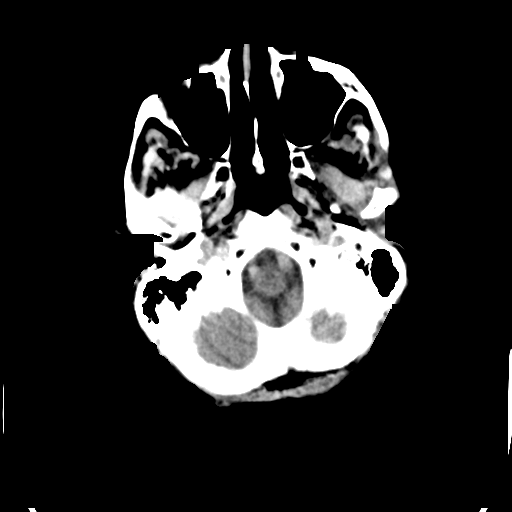
[im 5/34  bone]
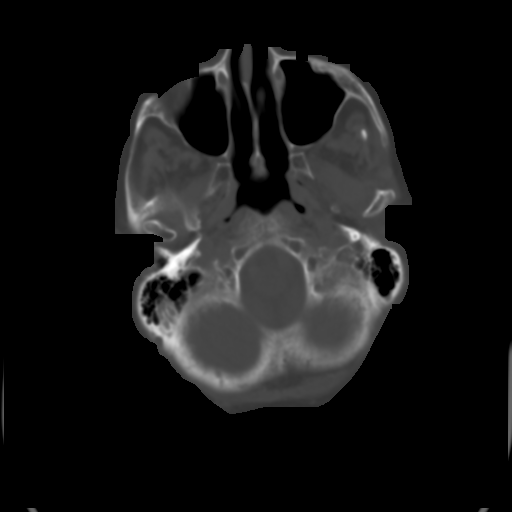
[im 9/34  brain]
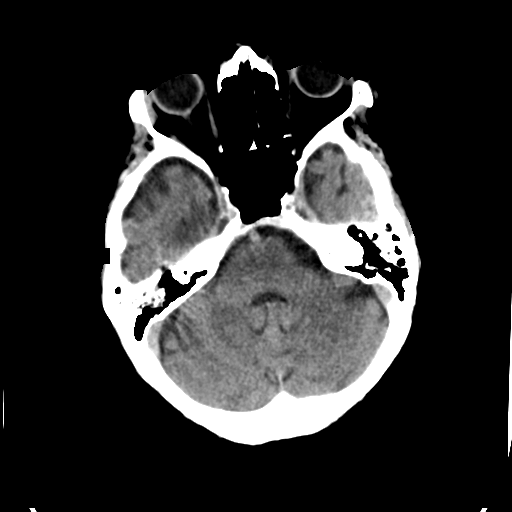
[im 13/34  brain]
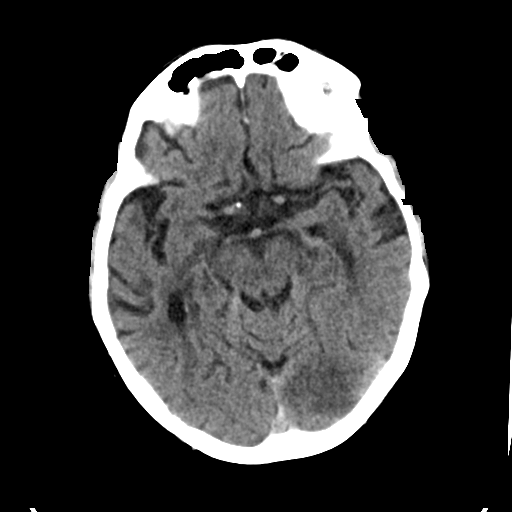
[im 17/34  brain]
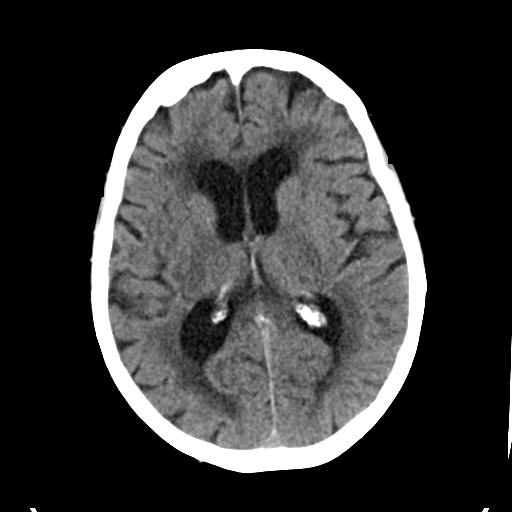
[im 21/34  brain]
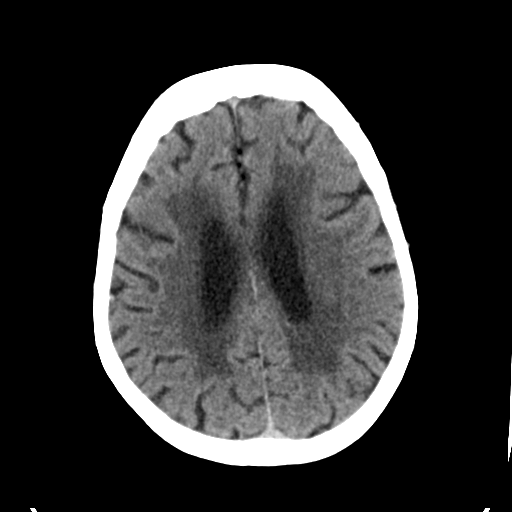
[im 21/34  bone]
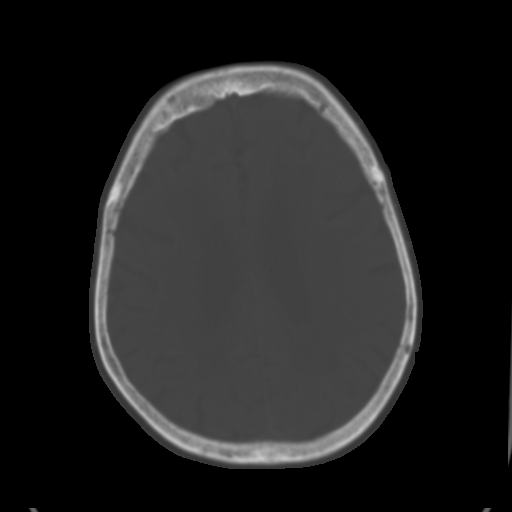
[im 25/34  brain]
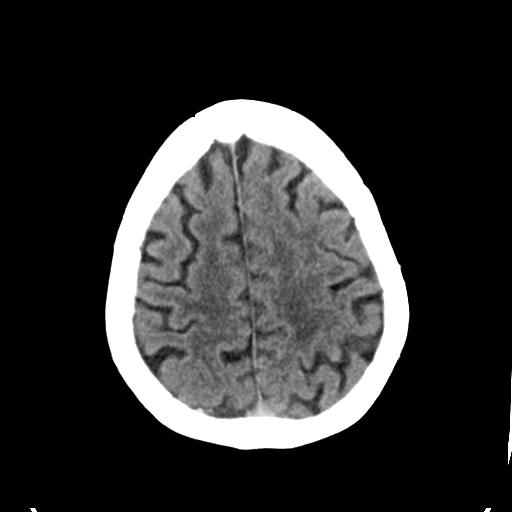
[im 29/34  brain]
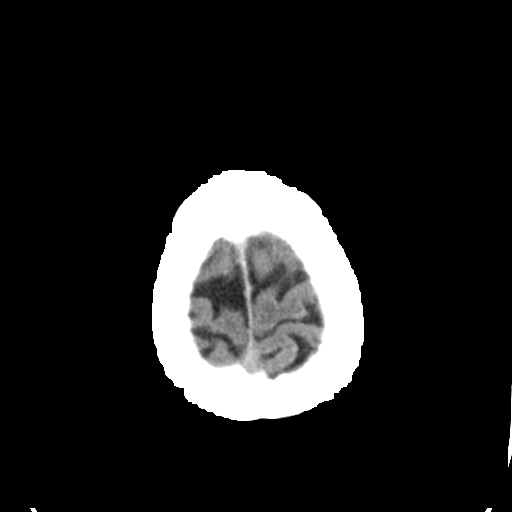

[Series 4: head bone · axial · 0.44mm/px · z∈[-63,-5]mm · 4 of 84 slices shown]
[im 9/84  bone]
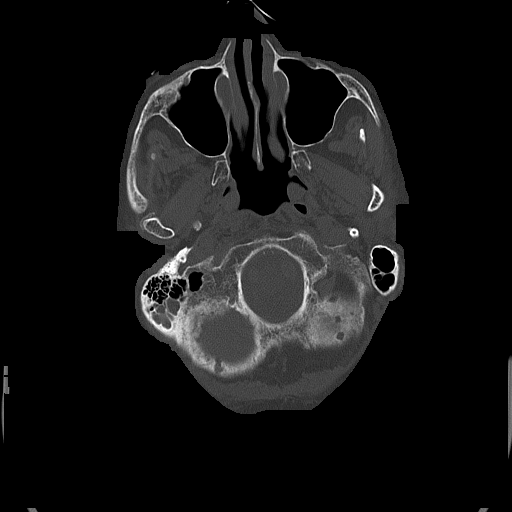
[im 17/84  bone]
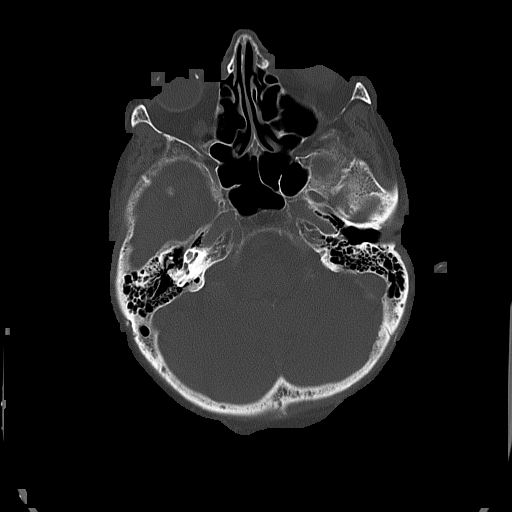
[im 25/84  bone]
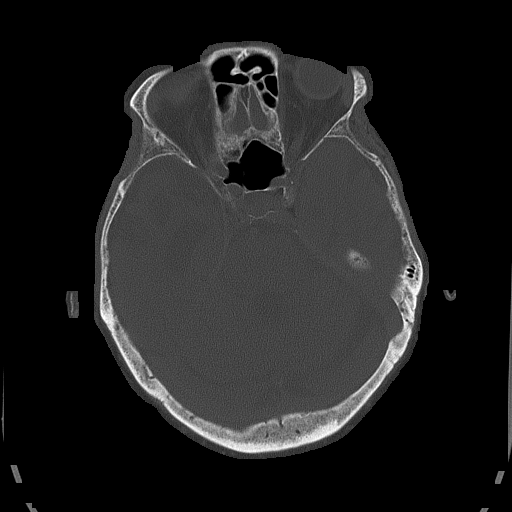
[im 38/84  bone]
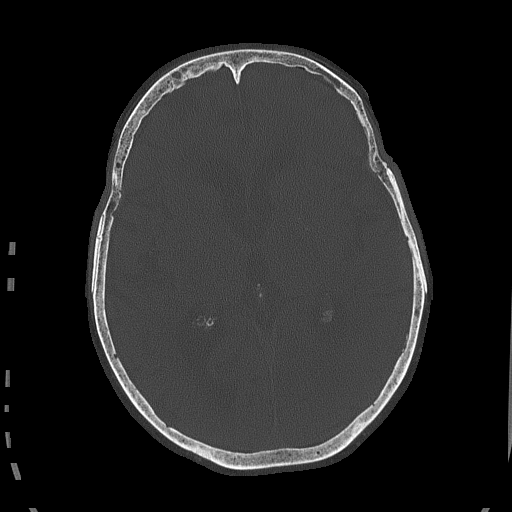

[Series 5: head without cor · coronal · non-contrast · 0.33mm/px · 3 of 67 slices shown]
[im 23/67  brain]
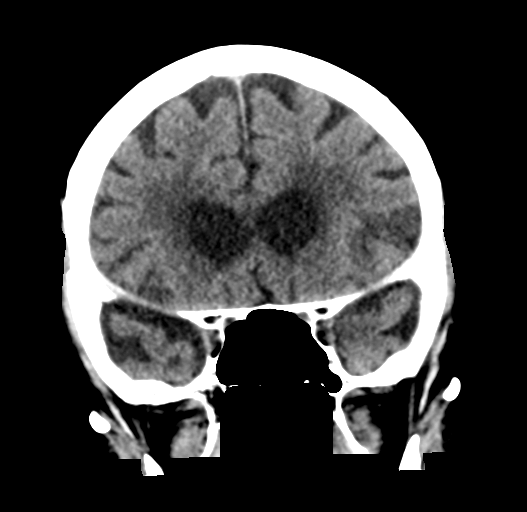
[im 30/67  brain]
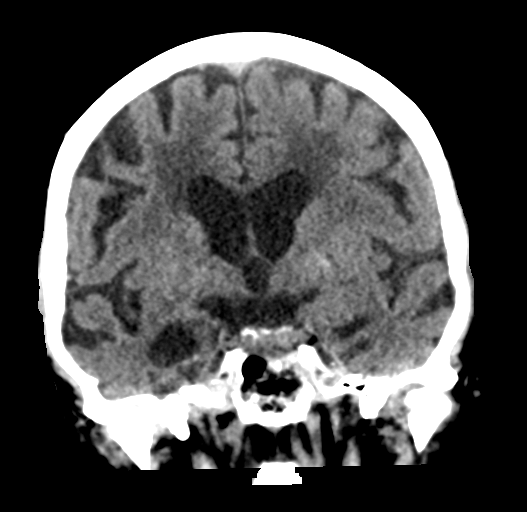
[im 37/67  brain]
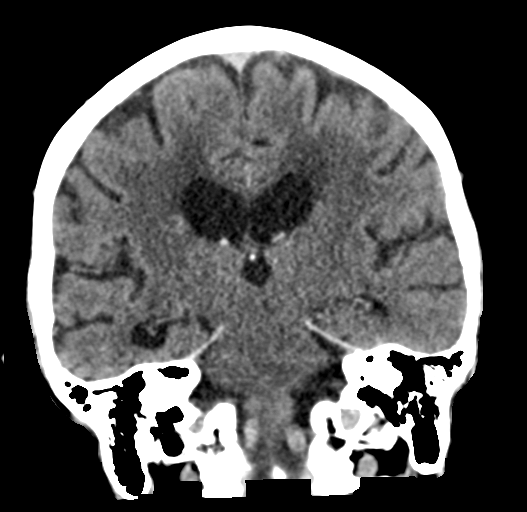

[Series 6: head without sag · sagittal · non-contrast · 0.34mm/px · 3 of 58 slices shown]
[im 20/58  brain]
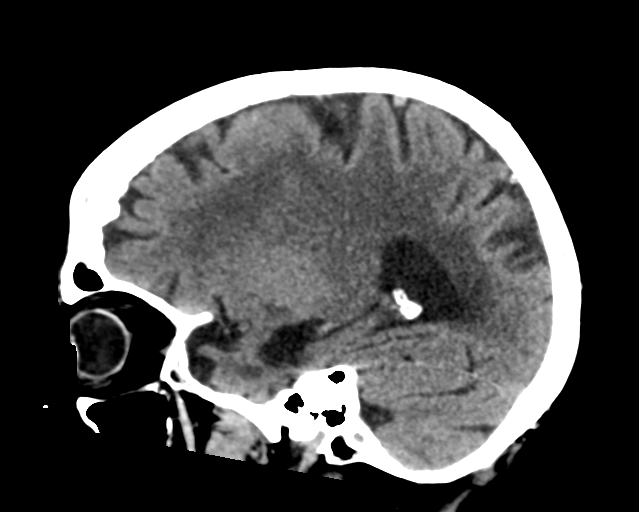
[im 29/58  brain]
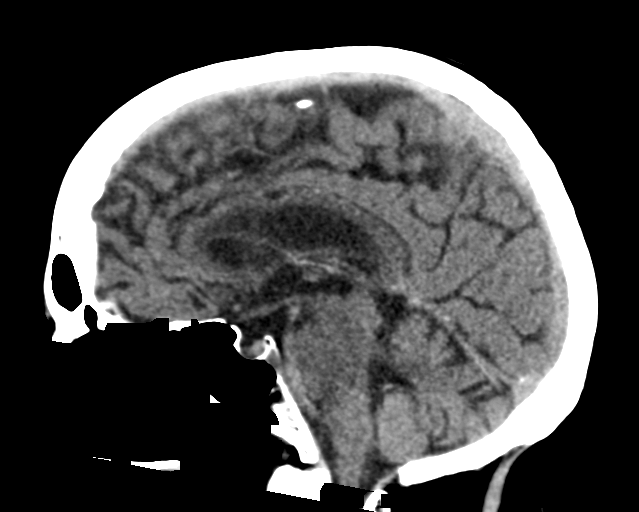
[im 39/58  brain]
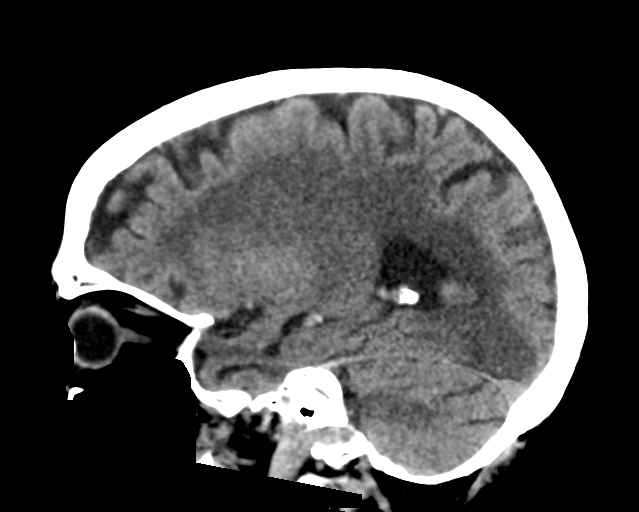

[17 of 47 positions shown; findings below may reference images not displayed]

FINDINGS: Brain: New area of hypoattenuation with loss of gray-white
differentiation in the left occipital lobe reflecting likely late
acute infarction. There is no acute intracranial hemorrhage.

Stable prominence of ventricles and sulci reflecting parenchymal
volume loss. Patchy confluent areas of hypoattenuation
supratentorial white matter nonspecific probably reflects stable
advanced chronic microvascular ischemic changes. No extra-axial
fluid collection.

Vascular: Atherosclerotic calcification at the skull base.

Skull: Calvarium is unremarkable.

Sinuses/Orbits: Paranasal sinuses are aerated orbits are
unremarkable.

Other: Partial opacification of the right mastoid tip.
IMPRESSION: New moderate size left occipital lobe infarction, likely late acute.
Of note, left PCA was occluded on recent CTA. No acute intracranial
hemorrhage.

Stable chronic findings detailed above.

## 2020-03-22 ENCOUNTER — Ambulatory Visit: Payer: Medicare Other | Admitting: Adult Health
